# Patient Record
Sex: Female | Born: 1935
Health system: Southern US, Community
[De-identification: ages and names within clinical notes are randomized; demographics above are authoritative.]

## PROBLEM LIST (undated history)

## (undated) DIAGNOSIS — D649 Anemia, unspecified: Secondary | ICD-10-CM

## (undated) DIAGNOSIS — F419 Anxiety disorder, unspecified: Secondary | ICD-10-CM

## (undated) DIAGNOSIS — I5030 Unspecified diastolic (congestive) heart failure: Secondary | ICD-10-CM

## (undated) DIAGNOSIS — I10 Essential (primary) hypertension: Secondary | ICD-10-CM

## (undated) DIAGNOSIS — I4891 Unspecified atrial fibrillation: Secondary | ICD-10-CM

## (undated) HISTORY — PX: ABDOMINAL HYSTERECTOMY: SHX81

## (undated) HISTORY — PX: APPENDECTOMY: SHX54

---

## 1998-08-08 ENCOUNTER — Encounter: Payer: Self-pay | Admitting: Emergency Medicine

## 1998-08-08 ENCOUNTER — Encounter: Payer: Self-pay | Admitting: Internal Medicine

## 1998-08-08 ENCOUNTER — Inpatient Hospital Stay (HOSPITAL_COMMUNITY): Admission: EM | Admit: 1998-08-08 | Discharge: 1998-08-08 | Payer: Self-pay | Admitting: Emergency Medicine

## 2005-12-09 ENCOUNTER — Ambulatory Visit: Payer: Self-pay | Admitting: Internal Medicine

## 2006-06-18 ENCOUNTER — Ambulatory Visit: Payer: Self-pay | Admitting: Gastroenterology

## 2007-04-02 ENCOUNTER — Inpatient Hospital Stay: Payer: Self-pay | Admitting: Internal Medicine

## 2007-04-02 ENCOUNTER — Other Ambulatory Visit: Payer: Self-pay

## 2008-03-10 ENCOUNTER — Emergency Department: Payer: Self-pay | Admitting: Emergency Medicine

## 2008-03-22 ENCOUNTER — Ambulatory Visit: Payer: Self-pay | Admitting: Unknown Physician Specialty

## 2008-03-27 ENCOUNTER — Ambulatory Visit: Payer: Self-pay | Admitting: Unknown Physician Specialty

## 2008-05-22 ENCOUNTER — Inpatient Hospital Stay: Payer: Self-pay | Admitting: *Deleted

## 2008-05-24 ENCOUNTER — Ambulatory Visit: Payer: Self-pay | Admitting: Internal Medicine

## 2008-06-04 ENCOUNTER — Inpatient Hospital Stay: Payer: Self-pay | Admitting: Internal Medicine

## 2008-06-10 ENCOUNTER — Emergency Department: Payer: Self-pay | Admitting: Unknown Physician Specialty

## 2008-09-23 ENCOUNTER — Emergency Department: Payer: Self-pay | Admitting: Internal Medicine

## 2009-04-08 ENCOUNTER — Emergency Department: Payer: Self-pay | Admitting: Emergency Medicine

## 2009-11-16 IMAGING — CT CT CHEST W/ CM
1 of 2 series · 13 of 31 positions shown, 17 images · IV contrast (APPLIED)
Comparison: none

REASON FOR EXAM: dyspnea  low O2  sat
COMMENTS:

[Series 7: lung windows · axial · 0.67mm/px · z∈[-643,-400]mm · 13 of 97 slices shown, 17 images]
[im 8/97  mediastinal]
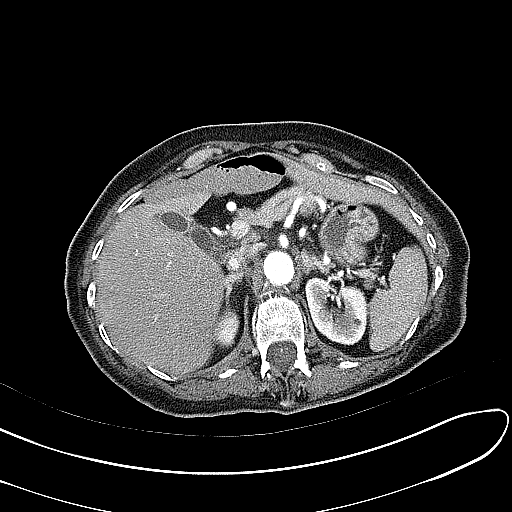
[im 8/97  lung]
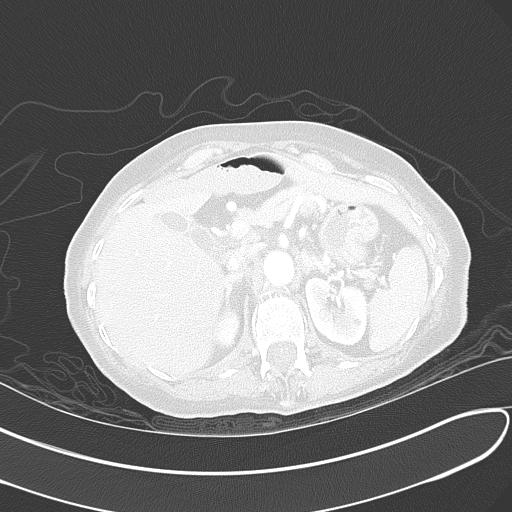
[im 15/97  lung]
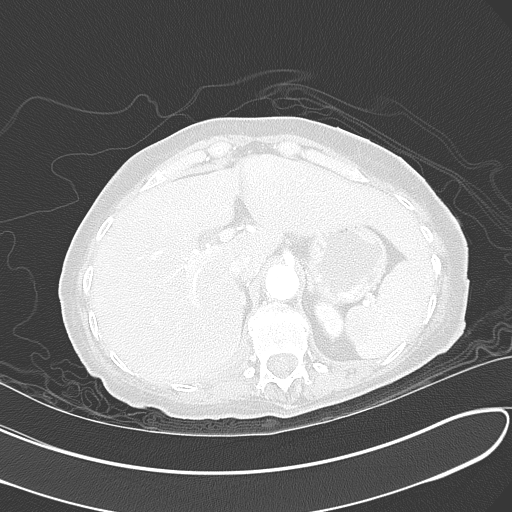
[im 23/97  lung]
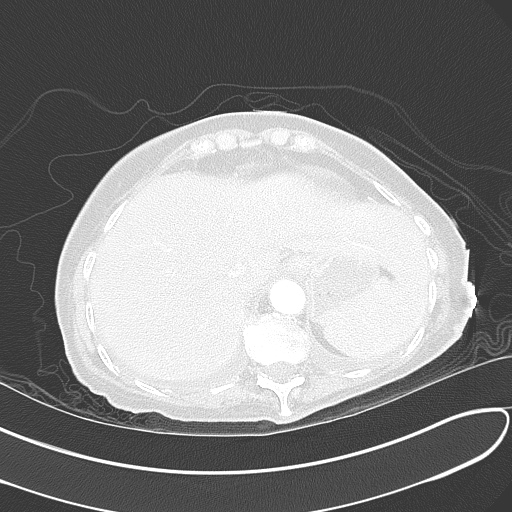
[im 30/97  lung]
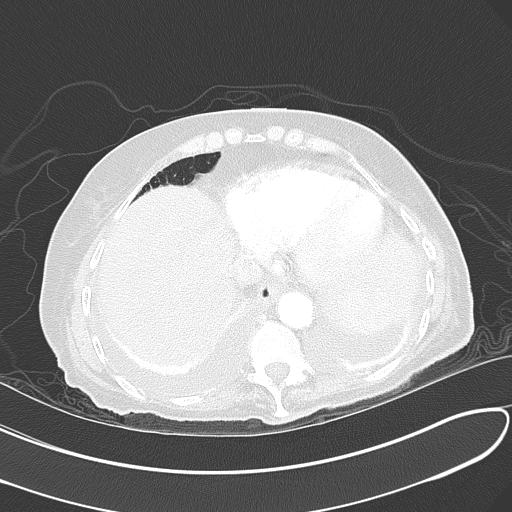
[im 37/97  mediastinal]
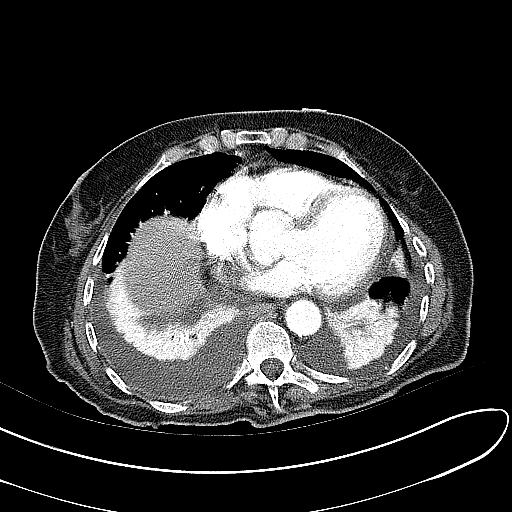
[im 37/97  lung]
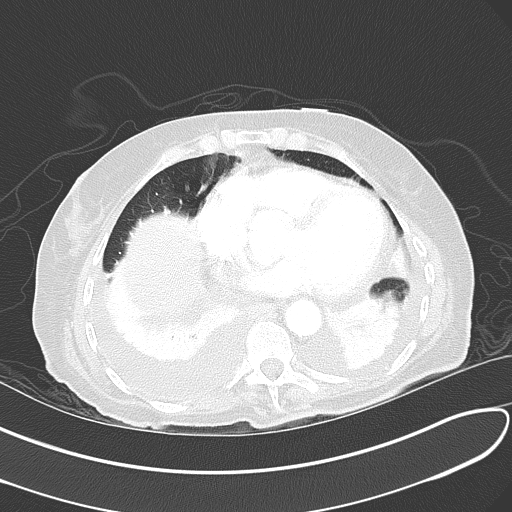
[im 45/97  lung]
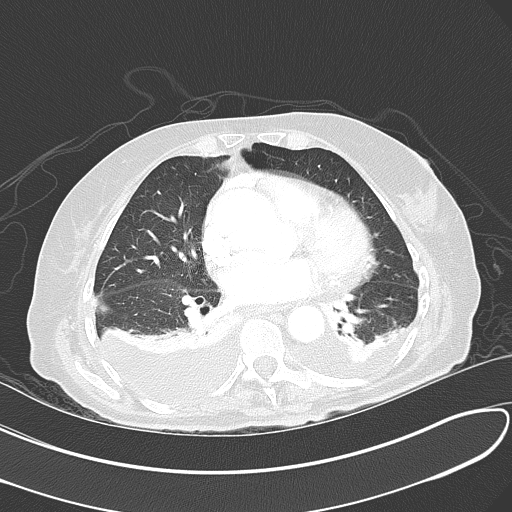
[im 49/97  lung]
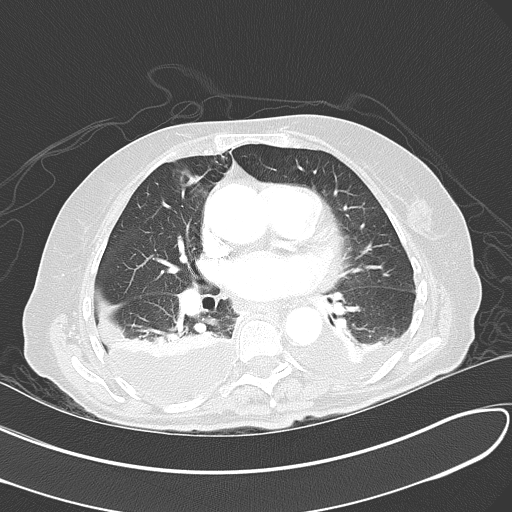
[im 52/97  lung]
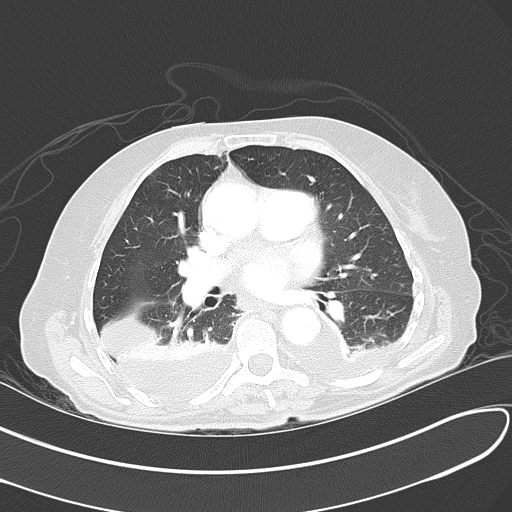
[im 60/97  mediastinal]
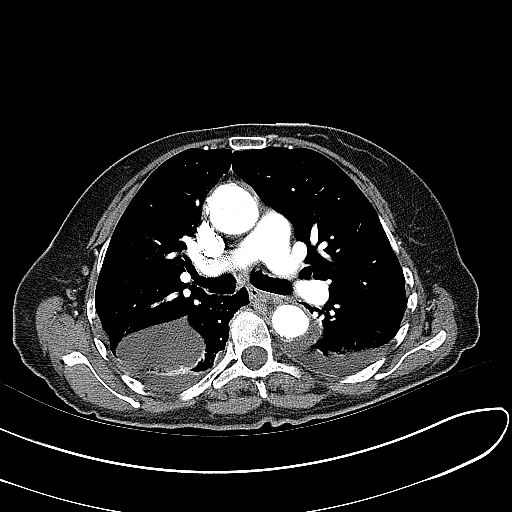
[im 60/97  lung]
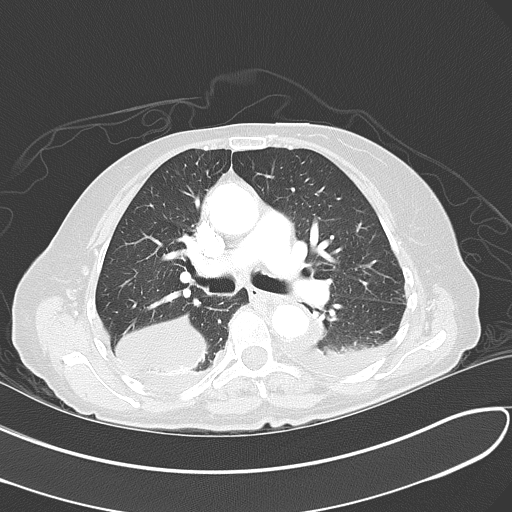
[im 67/97  lung]
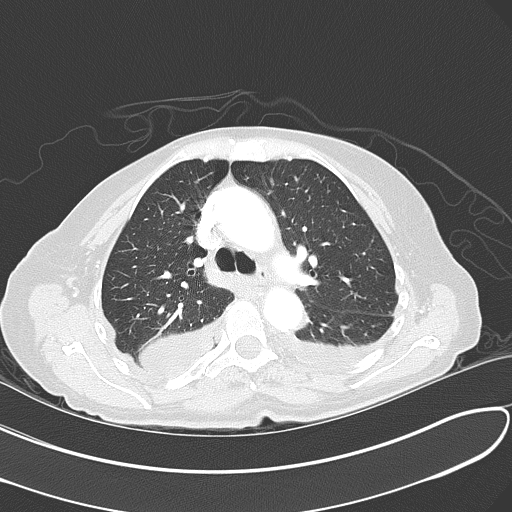
[im 74/97  lung]
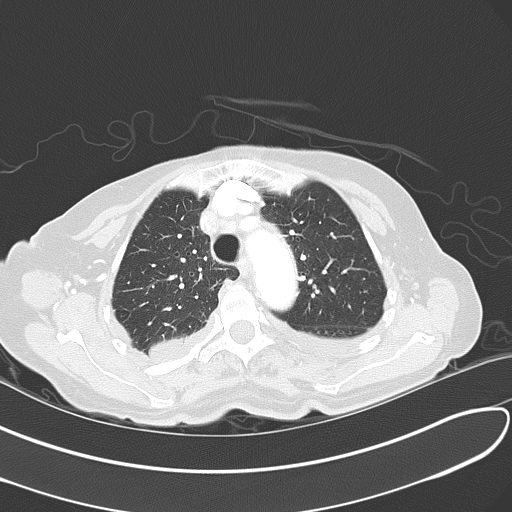
[im 82/97  lung]
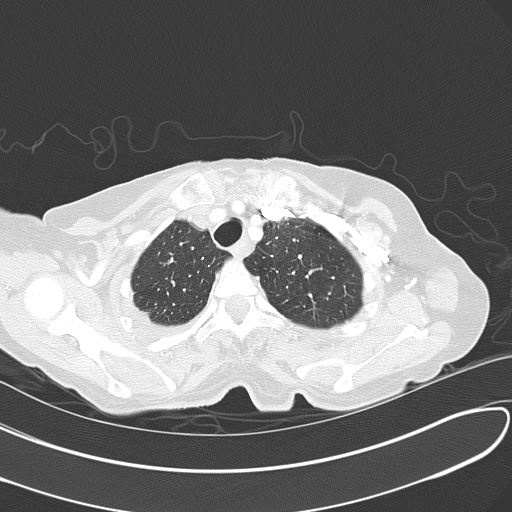
[im 89/97  mediastinal]
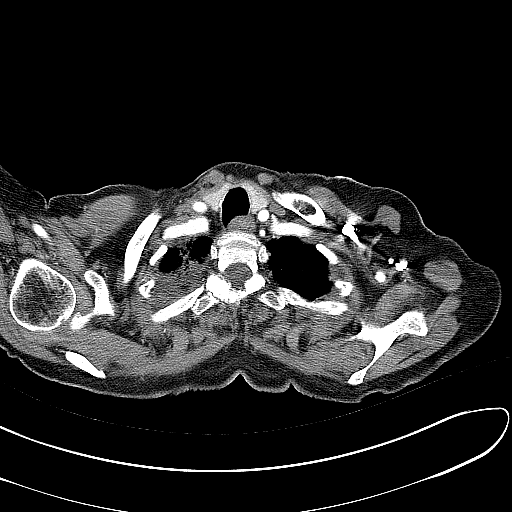
[im 89/97  lung]
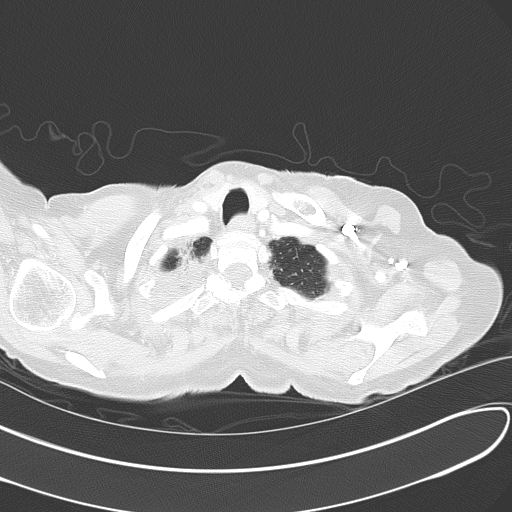

[13 of 31 positions shown; findings below may reference images not displayed]

PROCEDURE:     CT  - CT CHEST (FOR PE) W  - June 03, 2008 [DATE]

RESULT:     CT scanning was performed through the chest at 3 mm intervals
and slice thicknesses following intravenous administration of 100 cc of
Isovue 370. Review is made of the uppermost images of a CT scan of the
abdomen dated 22 May, 2008. The study was tailored to evaluate the
patient for possible acute pulmonary embolism. Review of 3-dimensional
reconstructed images was performed separately on the [employer] monitor.

There are are pleural effusions bilaterally. The effusion on the left is
smaller than that on the right. These are small to moderate in size. There
is bibasilar atelectasis. The cardiac chambers are enlarged. The caliber of
the thoracic aorta is normal. No false lumen is demonstrated. Contrast
within the pulmonary arterial tree is normal in appearance. I do not see
evidence of acute pulmonary embolism. No pathologic sized mediastinal or
hilar lymph nodes are demonstrated.

At lung window settings there is apical pleural scarring bilaterally. I do
not see alveolar infiltrates nor pulmonary parenchymal masses. Within the
upper abdomen the observed portions of the liver are normal. The adrenal
glands are not enlarged. The observed portions of the kidneys and spleen and
pancreas exhibit no acute abnormality. The gallbladder is partially imaged
and does not appear abnormal. Mild thickening of the limited amount of the
splenic flexure demonstrated is noted. The patient has had moderate
distention of bowel in this region in the past. Correlation clinically is
needed. Again, only a very small amount of the splenic flexure of the colon
is demonstrated

The thoracic vertebral bodies are preserved in height.
IMPRESSION: 1. There is no evidence of acute pulmonary embolism.
2. There is enlargement of the cardiac chambers with moderate sized
bilateral pleural effusions. A greater amount of fluid is on the right.
3. There is bibasilar atelectasis within both lungs.
4. There is mild thickening of the very small amount of the splenic flexure
the colon demonstrated. This is of questionable third significance.
Correlation clinically is needed.

A preliminary report was sent to the [HOSPITAL] the conclusion
of the study.

## 2009-11-16 IMAGING — CR DG CHEST 1V PORT
1 series · 1 of 1 positions shown · non-contrast
Comparison: none

REASON FOR EXAM: Shortness of Breath
COMMENTS:

[view not recorded]
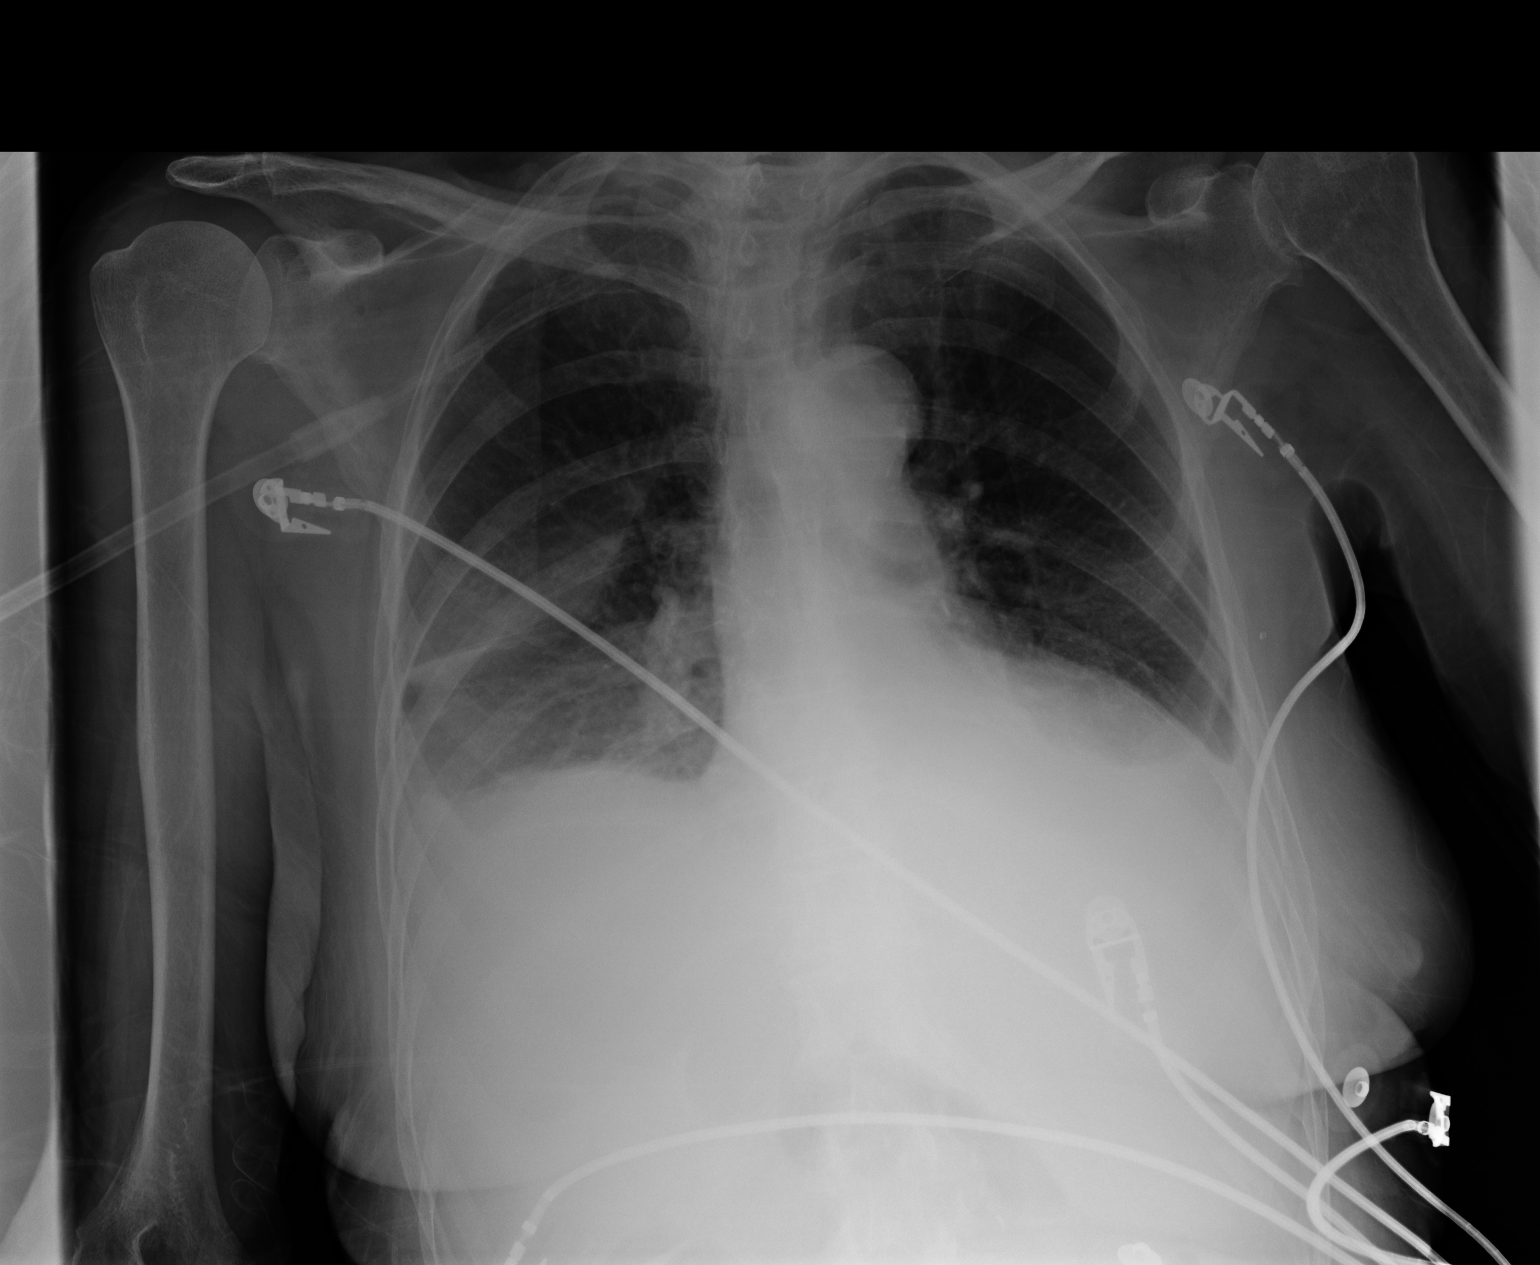

[1 of 1 positions shown; findings below may reference images not displayed]

PROCEDURE:     DXR - DXR PORTABLE CHEST SINGLE VIEW  - June 03, 2008  [DATE]

RESULT:     Comparison is made to a study dated 24 May, 2008.

The lungs are mildly hypoinflated. There are pleural effusions at both lung
bases not greatly changed from the prior study. The cardiac silhouette is
enlarged. The pulmonary vascularity is only mildly prominent.
IMPRESSION: There are bilateral pleural effusions. Left lower lobe
atelectasis is likely present. Overall there has not been dramatic change
since the prior study. A followup PA and lateral chest x-ray would be of
value.

## 2009-11-16 IMAGING — CT CT HEAD WITHOUT CONTRAST
2 series · 16 of 30 positions shown, 20 images · non-contrast
Comparison: none

REASON FOR EXAM: AMS
COMMENTS:

[Series 2: without · axial · non-contrast · 0.42mm/px · z∈[-11,+109]mm · 13 of 29 slices shown, 17 images]
[im 3/29  brain]
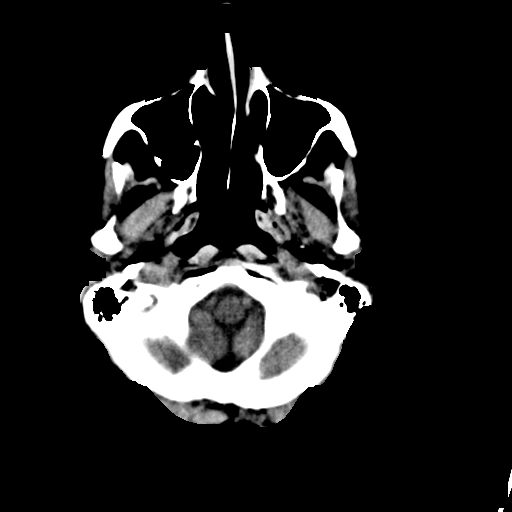
[im 3/29  bone]
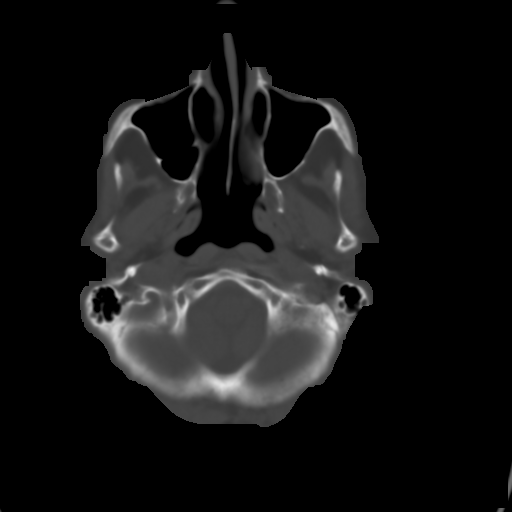
[im 5/29  brain]
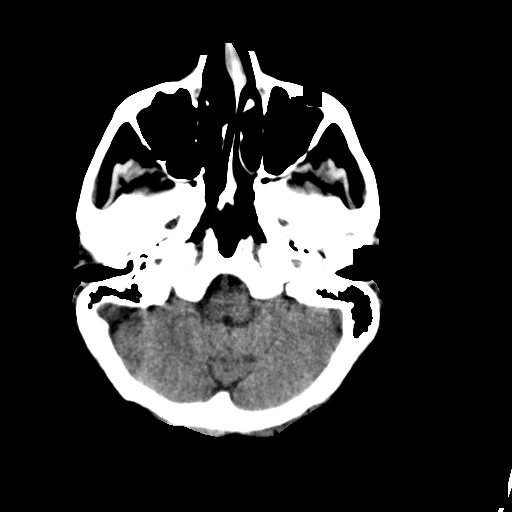
[im 7/29  brain]
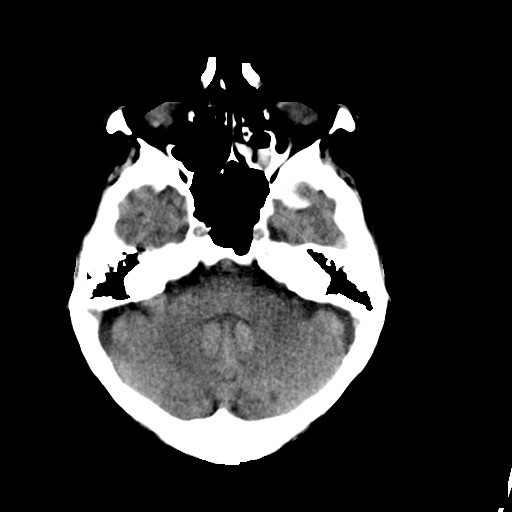
[im 9/29  brain]
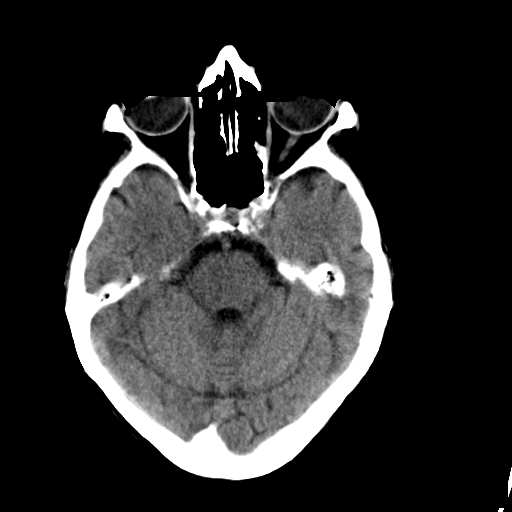
[im 11/29  brain]
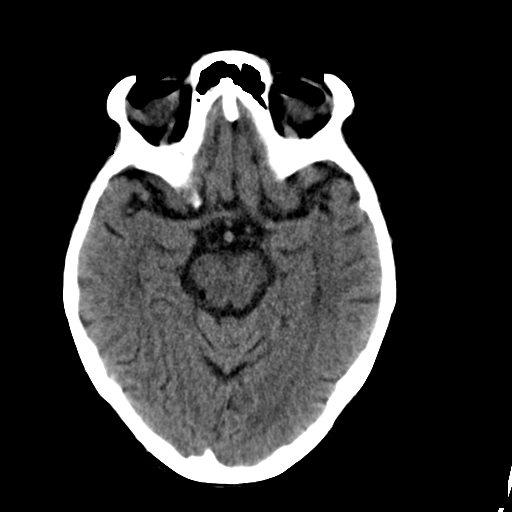
[im 11/29  bone]
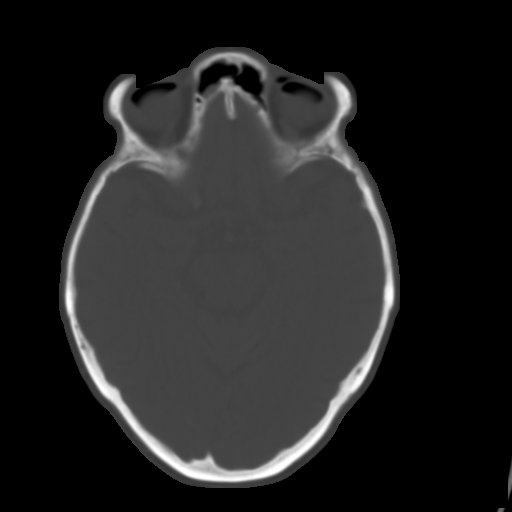
[im 13/29  brain]
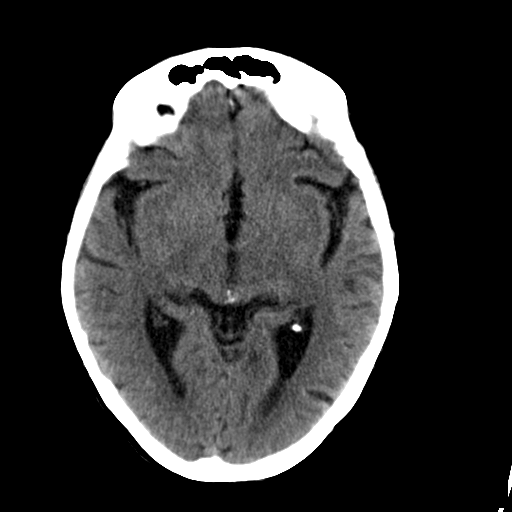
[im 15/29  brain]
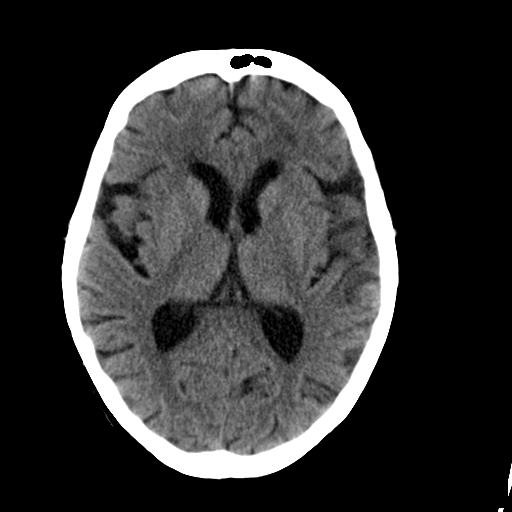
[im 17/29  brain]
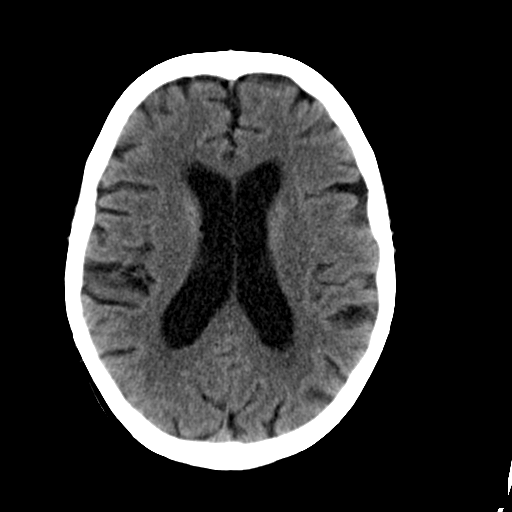
[im 19/29  brain]
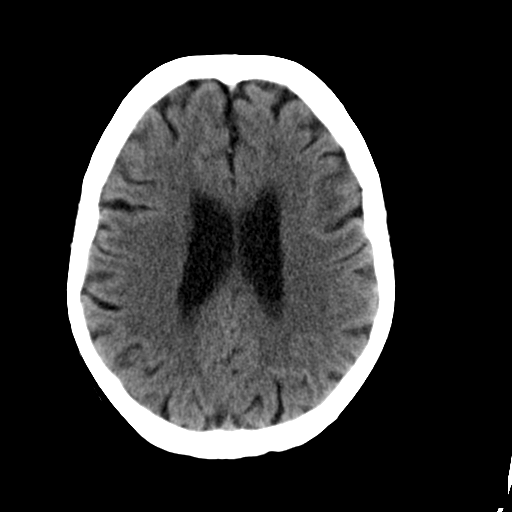
[im 19/29  bone]
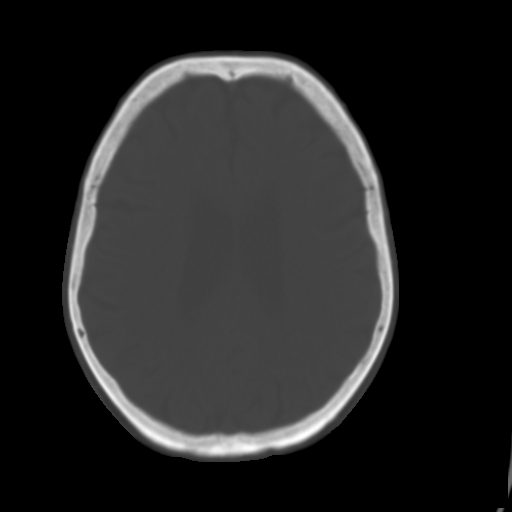
[im 21/29  brain]
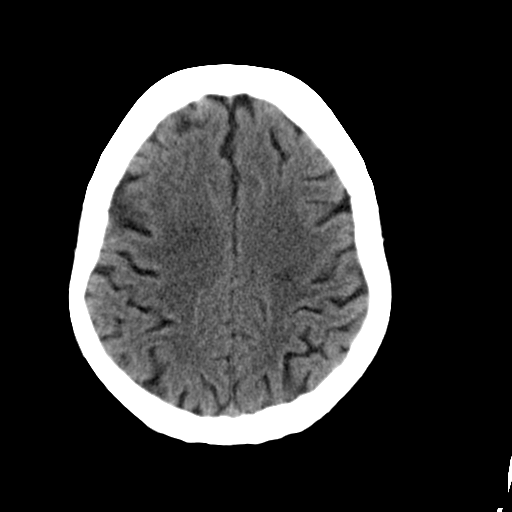
[im 23/29  brain]
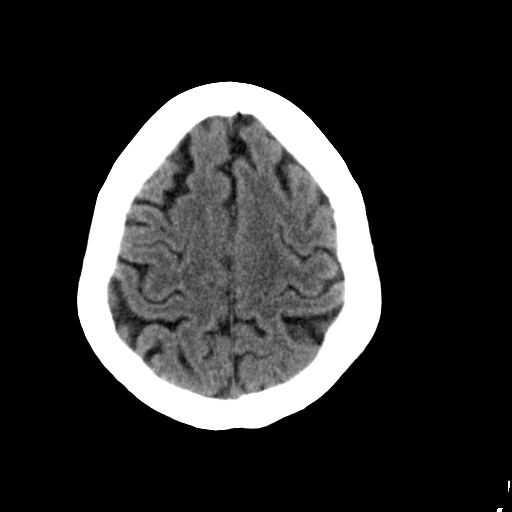
[im 25/29  brain]
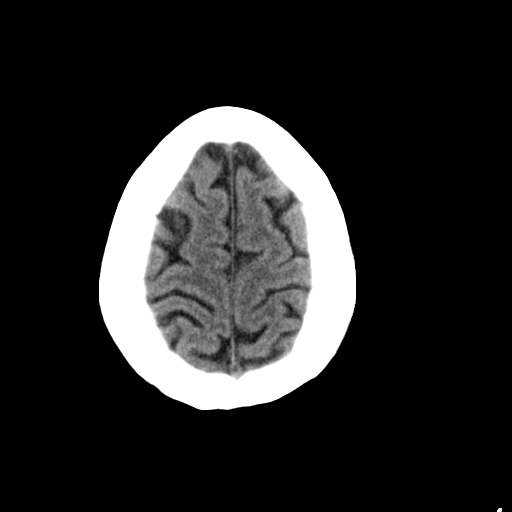
[im 27/29  brain]
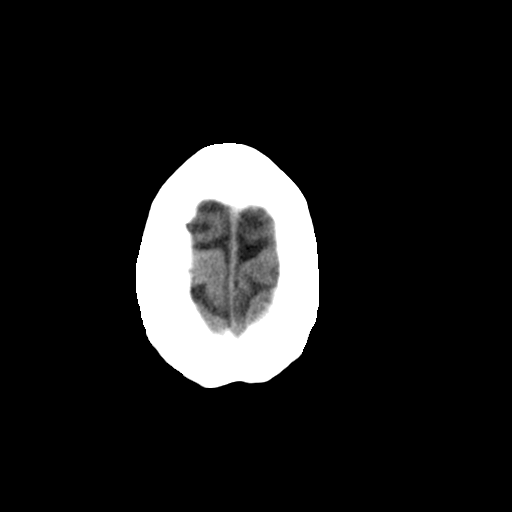
[im 27/29  bone]
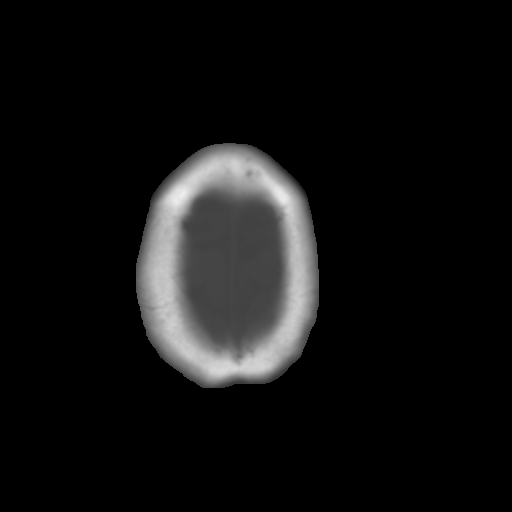

[Series 3: bone · axial · 0.42mm/px · z∈[-11,+29]mm · 3 of 29 slices shown]
[im 3/29  bone]
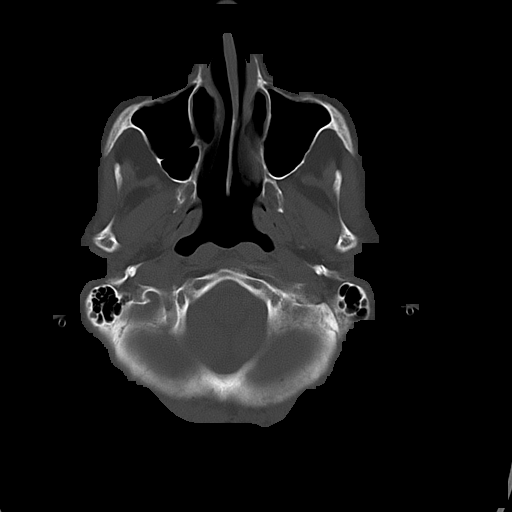
[im 7/29  bone]
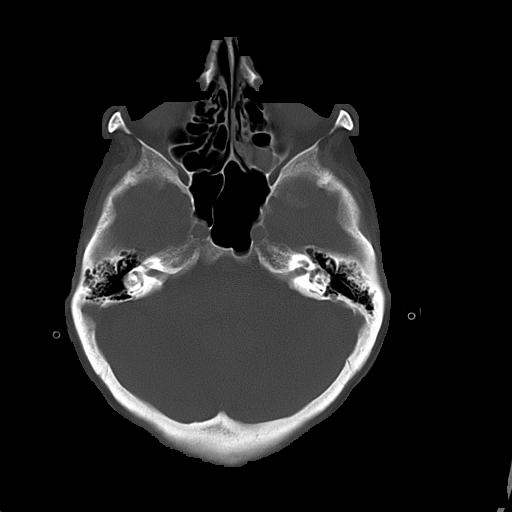
[im 11/29  bone]
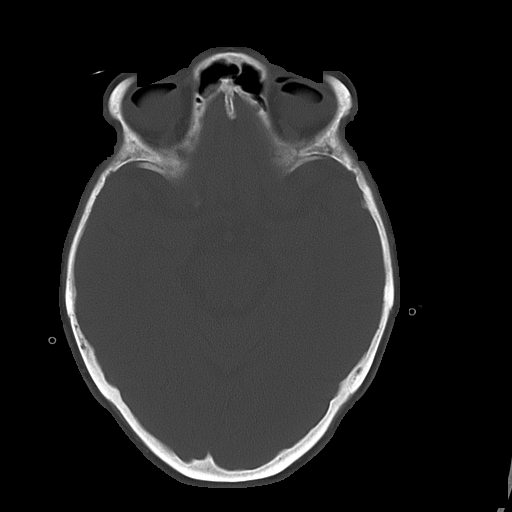

[16 of 30 positions shown; findings below may reference images not displayed]

PROCEDURE:     CT  - CT HEAD WITHOUT CONTRAST  - June 03, 2008 [DATE]

RESULT:     Axial noncontrast CT scanning was performed through the brain at
5 mm intervals and slice thicknesses.

The ventricles are normal in size and position. Very mild age-appropriate
atrophy is present. The cerebellum and brainstem are normal in appearance.
At bone window settings the observed portions of the paranasal sinuses
exhibit no air fluid levels. There is a small amount of soft tissue density
in posterior left ethmoid sinus cells. I do not see evidence of an acute
skull fracture.
IMPRESSION: There are mild age-appropriate atrophic changes present. I
do not see evidence of an acute ischemic or hemorrhagic event. No finding is
seen to suggest an intracranial mass.

## 2009-11-21 ENCOUNTER — Inpatient Hospital Stay: Payer: Self-pay | Admitting: Internal Medicine

## 2009-11-26 ENCOUNTER — Emergency Department: Payer: Self-pay | Admitting: Emergency Medicine

## 2011-04-21 ENCOUNTER — Ambulatory Visit: Payer: Self-pay | Admitting: Family Medicine

## 2011-08-12 ENCOUNTER — Inpatient Hospital Stay: Payer: Self-pay | Admitting: Internal Medicine

## 2011-08-12 LAB — CBC
HCT: 41 % (ref 35.0–47.0)
HGB: 13.4 g/dL (ref 12.0–16.0)
MCH: 29.8 pg (ref 26.0–34.0)
MCHC: 32.8 g/dL (ref 32.0–36.0)
MCV: 91 fL (ref 80–100)
Platelet: 190 10*3/uL (ref 150–440)
RBC: 4.51 10*6/uL (ref 3.80–5.20)
RDW: 14.5 % (ref 11.5–14.5)
WBC: 7.4 10*3/uL (ref 3.6–11.0)

## 2011-08-12 LAB — URINALYSIS, COMPLETE
Bacteria: NONE SEEN
Blood: NEGATIVE
Glucose,UR: NEGATIVE mg/dL (ref 0–75)
Hyaline Cast: 20
Leukocyte Esterase: NEGATIVE
Nitrite: NEGATIVE
Ph: 5 (ref 4.5–8.0)
Protein: 100
RBC,UR: 2 /HPF (ref 0–5)
Specific Gravity: 1.026 (ref 1.003–1.030)
Squamous Epithelial: NONE SEEN
WBC UR: 1 /HPF (ref 0–5)

## 2011-08-12 LAB — COMPREHENSIVE METABOLIC PANEL
Albumin: 3.5 g/dL (ref 3.4–5.0)
Alkaline Phosphatase: 125 U/L (ref 50–136)
Anion Gap: 12 (ref 7–16)
BUN: 19 mg/dL — ABNORMAL HIGH (ref 7–18)
Bilirubin,Total: 0.5 mg/dL (ref 0.2–1.0)
Calcium, Total: 8.7 mg/dL (ref 8.5–10.1)
Chloride: 106 mmol/L (ref 98–107)
Co2: 21 mmol/L (ref 21–32)
Creatinine: 1.02 mg/dL (ref 0.60–1.30)
EGFR (African American): 58 — ABNORMAL LOW
EGFR (Non-African Amer.): 50 — ABNORMAL LOW
Glucose: 121 mg/dL — ABNORMAL HIGH (ref 65–99)
Osmolality: 281 (ref 275–301)
Potassium: 3.1 mmol/L — ABNORMAL LOW (ref 3.5–5.1)
SGOT(AST): 39 U/L — ABNORMAL HIGH (ref 15–37)
SGPT (ALT): 25 U/L
Sodium: 139 mmol/L (ref 136–145)
Total Protein: 7.6 g/dL (ref 6.4–8.2)

## 2011-08-12 LAB — LIPASE, BLOOD: Lipase: 138 U/L (ref 73–393)

## 2011-08-12 LAB — CLOSTRIDIUM DIFFICILE BY PCR

## 2011-08-12 LAB — TROPONIN I: Troponin-I: <0.02 ng/mL

## 2011-08-13 LAB — CBC WITH DIFFERENTIAL/PLATELET
Eosinophil #: 0 10*3/uL (ref 0.0–0.7)
Eosinophil %: 0.7 %
Lymphocyte #: 1.5 10*3/uL (ref 1.0–3.6)
Lymphocyte %: 22.6 %
MCH: 29.9 pg (ref 26.0–34.0)
MCV: 91 fL (ref 80–100)
RBC: 3.39 10*6/uL — ABNORMAL LOW (ref 3.80–5.20)
RDW: 14.4 % (ref 11.5–14.5)
WBC: 6.7 10*3/uL (ref 3.6–11.0)

## 2011-08-13 LAB — BASIC METABOLIC PANEL
Creatinine: 0.9 mg/dL (ref 0.60–1.30)
EGFR (African American): >60
Glucose: 85 mg/dL (ref 65–99)
Osmolality: 278 (ref 275–301)
Sodium: 139 mmol/L (ref 136–145)

## 2011-08-14 LAB — CBC WITH DIFFERENTIAL/PLATELET
Basophil #: 0 10*3/uL (ref 0.0–0.1)
Eosinophil #: 0.1 10*3/uL (ref 0.0–0.7)
Eosinophil %: 2 %
HCT: 34.3 % — ABNORMAL LOW (ref 35.0–47.0)
HGB: 11.3 g/dL — ABNORMAL LOW (ref 12.0–16.0)
Lymphocyte #: 1.4 10*3/uL (ref 1.0–3.6)
Lymphocyte %: 25.9 %
MCH: 29.7 pg (ref 26.0–34.0)
MCHC: 32.9 g/dL (ref 32.0–36.0)
MCV: 90 fL (ref 80–100)
Monocyte #: 0.4 x10 3/mm (ref 0.2–0.9)
Platelet: 172 10*3/uL (ref 150–440)
RDW: 14.5 % (ref 11.5–14.5)
WBC: 5.4 10*3/uL (ref 3.6–11.0)

## 2011-08-14 LAB — BASIC METABOLIC PANEL
Anion Gap: 9 (ref 7–16)
BUN: 10 mg/dL (ref 7–18)
Chloride: 108 mmol/L — ABNORMAL HIGH (ref 98–107)
Co2: 22 mmol/L (ref 21–32)
Creatinine: 0.86 mg/dL (ref 0.60–1.30)
EGFR (African American): >60
EGFR (Non-African Amer.): >60
Glucose: 81 mg/dL (ref 65–99)
Potassium: 4.2 mmol/L (ref 3.5–5.1)

## 2011-08-15 LAB — BASIC METABOLIC PANEL
Anion Gap: 10 (ref 7–16)
BUN: 12 mg/dL (ref 7–18)
Calcium, Total: 8.5 mg/dL (ref 8.5–10.1)
Co2: 23 mmol/L (ref 21–32)
EGFR (African American): >60
Glucose: 88 mg/dL (ref 65–99)
Potassium: 3.8 mmol/L (ref 3.5–5.1)
Sodium: 140 mmol/L (ref 136–145)

## 2011-08-22 ENCOUNTER — Emergency Department: Payer: Self-pay | Admitting: *Deleted

## 2011-08-22 LAB — CBC
HCT: 33.5 % — ABNORMAL LOW (ref 35.0–47.0)
HGB: 11.1 g/dL — ABNORMAL LOW (ref 12.0–16.0)
MCHC: 33.2 g/dL (ref 32.0–36.0)
MCV: 91 fL (ref 80–100)
WBC: 4 10*3/uL (ref 3.6–11.0)

## 2011-08-22 LAB — URINALYSIS, COMPLETE
Bilirubin,UR: NEGATIVE
Blood: NEGATIVE
Glucose,UR: NEGATIVE mg/dL (ref 0–75)
Nitrite: NEGATIVE
Ph: 7 (ref 4.5–8.0)
Protein: NEGATIVE
RBC,UR: NONE SEEN /HPF (ref 0–5)
Specific Gravity: 1.004 (ref 1.003–1.030)
WBC UR: NONE SEEN /HPF (ref 0–5)

## 2011-08-22 LAB — COMPREHENSIVE METABOLIC PANEL
Albumin: 2.7 g/dL — ABNORMAL LOW (ref 3.4–5.0)
Alkaline Phosphatase: 73 U/L (ref 50–136)
Anion Gap: 9 (ref 7–16)
BUN: 11 mg/dL (ref 7–18)
Bilirubin,Total: 0.2 mg/dL (ref 0.2–1.0)
Chloride: 110 mmol/L — ABNORMAL HIGH (ref 98–107)
Creatinine: 0.67 mg/dL (ref 0.60–1.30)
EGFR (Non-African Amer.): >60
Osmolality: 284 (ref 275–301)
Potassium: 4 mmol/L (ref 3.5–5.1)
SGOT(AST): 17 U/L (ref 15–37)
Total Protein: 6.4 g/dL (ref 6.4–8.2)

## 2011-08-22 LAB — LIPASE, BLOOD: Lipase: 165 U/L (ref 73–393)

## 2012-04-26 ENCOUNTER — Emergency Department: Payer: Self-pay | Admitting: Emergency Medicine

## 2012-04-26 LAB — CBC
HCT: 40.1 % (ref 35.0–47.0)
MCV: 89 fL (ref 80–100)
RBC: 4.48 10*6/uL (ref 3.80–5.20)
WBC: 5.9 10*3/uL (ref 3.6–11.0)

## 2012-04-26 LAB — COMPREHENSIVE METABOLIC PANEL
Albumin: 4 g/dL (ref 3.4–5.0)
Alkaline Phosphatase: 127 U/L (ref 50–136)
Anion Gap: 9 (ref 7–16)
BUN: 15 mg/dL (ref 7–18)
Bilirubin,Total: 0.2 mg/dL (ref 0.2–1.0)
Chloride: 104 mmol/L (ref 98–107)
Creatinine: 0.82 mg/dL (ref 0.60–1.30)
EGFR (Non-African Amer.): >60
Glucose: 97 mg/dL (ref 65–99)
Potassium: 3.6 mmol/L (ref 3.5–5.1)
SGPT (ALT): 15 U/L (ref 12–78)
Total Protein: 8.3 g/dL — ABNORMAL HIGH (ref 6.4–8.2)

## 2012-04-26 LAB — CK TOTAL AND CKMB (NOT AT ARMC)
CK, Total: 44 U/L (ref 21–215)
CK-MB: 0.5 ng/mL (ref 0.5–3.6)

## 2012-04-26 LAB — TROPONIN I: Troponin-I: <0.02 ng/mL

## 2013-01-10 ENCOUNTER — Ambulatory Visit: Payer: Self-pay | Admitting: Unknown Physician Specialty

## 2013-12-26 ENCOUNTER — Ambulatory Visit: Payer: Self-pay | Admitting: Ophthalmology

## 2014-04-17 DIAGNOSIS — E782 Mixed hyperlipidemia: Secondary | ICD-10-CM | POA: Diagnosis not present

## 2014-04-17 DIAGNOSIS — I2510 Atherosclerotic heart disease of native coronary artery without angina pectoris: Secondary | ICD-10-CM | POA: Diagnosis not present

## 2014-04-17 DIAGNOSIS — I10 Essential (primary) hypertension: Secondary | ICD-10-CM | POA: Diagnosis not present

## 2014-04-17 DIAGNOSIS — I480 Paroxysmal atrial fibrillation: Secondary | ICD-10-CM | POA: Diagnosis not present

## 2014-07-23 NOTE — Consult Note (Signed)
Chief Complaint:   Subjective/Chief Complaint feeling better today, one loose stool early this am.  less abdominal pain, no n/v.   VITAL SIGNS/ANCILLARY NOTES: **Vital Signs.:   15-May-13 10:09   Vital Signs Type Pre Medication   Temperature Temperature (F) 98.2   Celsius 36.7   Temperature Source oral   Pulse Pulse 71   Pulse source per Dinamap   Respirations Respirations 18   Systolic BP Systolic BP 116   Diastolic BP (mmHg) Diastolic BP (mmHg) 64   Mean BP 80   BP Source Dinamap   Pulse Ox % Pulse Ox % 92   Pulse Ox Activity Level  At rest   Oxygen Delivery Room Air/ 21 %   Brief Assessment:   Cardiac Regular    Respiratory clear BS    Gastrointestinal details normal Soft  Nondistended  No masses palpable  Bowel sounds normal  No rebound tenderness  mild to moderate lower abdominal pain to palpation   Routine Hem:  15-May-13 02:49    WBC (CBC) 6.7   RBC (CBC) 3.39   Hemoglobin (CBC) 10.1   Hematocrit (CBC) 30.7   Platelet Count (CBC) 157   MCV 91   MCH 29.9   MCHC 33.0   RDW 14.4  Routine Chem:  15-May-13 02:49    Glucose, Serum 85   BUN 15   Creatinine (comp) 0.90   Sodium, Serum 139   Potassium, Serum 4.5   Chloride, Serum 112   CO2, Serum 21   Calcium (Total), Serum 7.7   Osmolality (calc) 278   eGFR (African American) >60   eGFR (Non-African American) 58   Anion Gap 6  Routine Hem:  15-May-13 02:49    Neutrophil % 68.7   Lymphocyte % 22.6   Monocyte % 7.8   Eosinophil % 0.7   Basophil % 0.2   Neutrophil # 4.6   Lymphocyte # 1.5   Monocyte # 0.5   Eosinophil # 0.0   Basophil # 0.0  Routine Chem:  15-May-13 02:49    Magnesium, Serum 2.1   Assessment/Plan:  Assessment/Plan:   Assessment 1) acute diarrheal illness-improving, likely gastroenteritis.    Plan 1) continue current, advance diet to full liquids tomorrow. stool culture results pending.   Electronic Signatures: Loistine Simas (MD)  (Signed [phone] 12:42)  Authored: Chief  Complaint, VITAL SIGNS/ANCILLARY NOTES, Brief Assessment, Lab Results, Assessment/Plan   Last Updated: 15-May-13 12:42 by Loistine Simas (MD)

## 2014-07-23 NOTE — H&P (Signed)
PATIENT NAME:  Colleen Nguyen, KOEHL MR#:  [id number] DATE OF BIRTH:  09/17/1922  DATE OF ADMISSION:  08/12/2011  PRIMARY CARE PHYSICIAN: Dr. Ellison Hughs.   REFERRING PHYSICIAN: Dr. Renee Ramus.   CHIEF COMPLAINT: Diarrhea, abdominal pain.   HISTORY OF PRESENT ILLNESS: This is an 81 year old female who lives at home who presents with complaints of diarrhea and abdominal pain. The patient reports diarrhea started yesterday when she had multiple episodes of bowel movements which she cannot even count, watery brown in color. The patient reports she has been having abdominal pain with that and nausea and dry heaves, but no actual vomiting. The patient denies any coffee-ground emesis, any hematochezia, any blood with the stools. The patient had a CT of abdomen done in the Emergency Department without IV contrast, which did show bowel wall thickening and pericolic inflammatory changes involving the cecum, ascending colon, descending colon and sigmoid colon, which is most concerning for colitis, which may be secondary to an infectious or inflammatory etiology. The patient reports she has been taking doxycycline for two weeks for right eye infection. Denies any previous history of C. difficile, but reports she was admitted a couple of years ago for acute diverticulitis where it resolved with IV antibiotics and reports no recurrence of any abdominal complaints since then other than occasional diarrhea. The patient was given IV Levaquin and Flagyl in the ED. The patient denies any chest pain, shortness of breath, near syncope, syncope, or lightheadedness.   PAST MEDICAL HISTORY:  1. History of previously known diverticulitis.  2. Known coronary disease.  3. Paroxysmal atrial fibrillation, currently normal sinus rhythm, not on chronic anticoagulation.  4. Chronic back pain.  5. Anemia of chronic disease.  6. Hyperlipidemia.  7. Anxiety disorder.  8. Gastroesophageal reflux disease.   PAST SURGICAL HISTORY: Schatzki  ring, which was dilated.   HOME MEDICATIONS:  1. Aspirin 81 mg daily.  2. Xanax 0.5 mg p.o. b.i.d. p.r.n.  3. Cardizem CD 180 mg daily.  4. Metoprolol p.r.n. once after heart rate is out of control.   ALLERGIES: Phenergan.  FAMILY HISTORY: No history of heart disease.   SOCIAL HISTORY: No history of smoking, alcohol abuse. She lives with her daughter.   REVIEW OF SYSTEMS: CONSTITUTIONAL: Denies any fever. Complains of fatigue and weakness. EYES: Denies any blurry vision or double vision. Has recent eye infection. ENT: Denies any tinnitus, ear pain, hearing loss. RESPIRATORY: Denies any cough, wheezing, hemoptysis. CARDIOVASCULAR: Denies any chest pain, orthopnea, edema. GASTROINTESTINAL: Denies any hematemesis, coffee-ground emesis. Complains of nausea, dry heaves and diarrhea, multiple episodes, watery, brown in color. GENITOURINARY: Denies any dysuria, hematuria, renal colic. ENDOCRINE: Denies any polyuria, polydipsia, heat or cold intolerance. HEMATOLOGY: Denies any easy bruising, bleeding, diathesis, or history of blood clots. NEUROLOGIC: Denies any numbness, dysarthria, epilepsy, tremors. PSYCH: Denies any insomnia, alcohol or drug abuse.   PHYSICAL EXAMINATION:  VITAL SIGNS: Temperature 95.9, pulse 75, respiratory rate 18, blood pressure 132/67, saturating 95% on room air.   GENERAL: Elderly, frail female who is comfortable in bed in no apparent distress.   HEENT: Head atraumatic, normocephalic. Pupils equal, reactive to light. Pink conjunctiva. Anicteric sclera. Dry oral mucosa.   NECK: Supple. No thyromegaly. No JVD.   CHEST: Good air entry bilaterally. No wheezing, rales, or rhonchi.   CARDIOVASCULAR: S1, S2 heard. No rubs, murmur, or gallops.   ABDOMEN: Mild tenderness. No rebound. No guarding. Bowel sounds hyperperistaltic.   EXTREMITIES: No edema. No clubbing. No cyanosis.   PSYCHIATRIC: Appropriate affect. Awake,  alert x3. Intact judgment and insight.   NEUROLOGIC:  Cranial nerves grossly intact. Motor nonfocal.   PERTINENT LABS: Glucose 121, BUN 19, creatinine 1.02, sodium 139, potassium 3.1, chloride 106, CO2 21, total protein 7.6, alkaline phosphatase 125, AST 39, ALT 25. Troponin less than 0.02. White blood cells 7.4, hemoglobin 13.4, hematocrit 41, platelets 190.  Urinalysis negative. EKG: The patient actually is in paroxysmal atrial fibrillation with RVR. At one point she is rate controlled. One EKG showing heart rate of 77, normal sinus rhythm. Another EKG done a little bit later showing atrial fibrillation with RVR at a rate of 116.   ASSESSMENT AND PLAN:  1. Colitis. The patient has evidence of colitis on the CT, has diarrhea. Will send stool work-up. Will send stool cultures, white blood cells and C. difficile. The patient already received IV Levaquin and Flagyl in the ED. We will keep the patient on IV Flagyl until the rest of the stool work-up is done and C. difficile is ruled out. Once ruled, the patient can be resumed on IV Cipro, IV Levaquin. Discussed with gastroenterology, Dr. Gustavo Lah, who will see the patient and we will keep her on clear liquid diet and IV fluid hydration.  2. History of atrial fibrillation. The patient is having paroxysmal atrial fibrillation with RVR. Will continue her on Cardizem CD 180 mg and will have her on p.r.n. metoprolol for episodes of atrial fibrillation with RVR.  3. Hypokalemia. We will replace.  4. Deep vein thrombosis prophylaxis. Subcutaneous heparin 5,000 units every eight hours.  5. Gastrointestinal prophylaxis with Protonix.  6. CODE STATUS: The patient is FULL CODE.   TOTAL TIME SPENT FOR PATIENT CARE: 50 minutes.   ____________________________ Albertine Patricia, MD dse:ap D: 08/12/2011 09:00:20 ET                  T: 08/12/2011 09:46:34 ET JOB#: [id number] cc: Albertine Patricia, MD, <Dictator> Sofie Hartigan, MD Dimetrius Montfort Graciela Husbands MD ELECTRONICALLY SIGNED 08/16/2011 0:18

## 2014-07-23 NOTE — Consult Note (Signed)
Chief Complaint:   Subjective/Chief Complaint feeling some better, less nausea, one bm this am, loose.  much less abdominal pain.   VITAL SIGNS/ANCILLARY NOTES: **Vital Signs.:   17-May-13 13:25   Vital Signs Type Routine   Temperature Temperature (F) 97.1   Celsius 36.1   Temperature Source axillary   Pulse Pulse 76   Pulse source per Dinamap   Respirations Respirations 20   Systolic BP Systolic BP 424   Diastolic BP (mmHg) Diastolic BP (mmHg) 66   Mean BP 81   BP Source Dinamap   Pulse Ox % Pulse Ox % 92   Pulse Ox Activity Level  At rest   Oxygen Delivery Room Air/ 21 %  *Intake and Output.:   17-May-13 08:02   Stool  mod loose   Brief Assessment:   Cardiac Irregular    Respiratory clear BS    Gastrointestinal details normal Soft  Nondistended  No masses palpable  Bowel sounds normal  No rebound tenderness  mild rlq discomfort to palpation   Routine Chem:  17-May-13 03:04    Glucose, Serum 88   BUN 12   Creatinine (comp) 0.85   Sodium, Serum 140   Potassium, Serum 3.8   Chloride, Serum 107   CO2, Serum 23   Calcium (Total), Serum 8.5   Osmolality (calc) 279   eGFR (African American) >60   eGFR (Non-African American) >60   Anion Gap 10   Assessment/Plan:  Assessment/Plan:   Assessment 1) diarrheal illness-improving, much less abdominal pain and nausea. tolerating regular po.  2) af/rvr this am    Plan 1) finish 7-10 day course of abx as you are.  start align probiotic.  Will need GI outpatient fu in a couple weeks, sooner if needed. Will ask Dr Dionne Milo to see tomorrow.   Electronic Signatures: Loistine Simas (MD)  (Signed 17-May-13 15:48)  Authored: Chief Complaint, VITAL SIGNS/ANCILLARY NOTES, Brief Assessment, Lab Results, Assessment/Plan   Last Updated: 17-May-13 15:48 by Loistine Simas (MD)

## 2014-07-23 NOTE — Consult Note (Signed)
PATIENT NAME:  Colleen Nguyen, Colleen Nguyen MR#:  [id number] DATE OF BIRTH:  [birth year]  DATE OF CONSULTATION:  08/12/2011  REFERRING PHYSICIAN:  Phillips Climes, MD CONSULTING PHYSICIAN:  Lollie Sails, MD  REASON FOR CONSULTATION: Colitis.   HISTORY OF PRESENT ILLNESS: Ms. Hunton is an 81 year old Caucasian female who was in her usual state of health until this past Monday, that is yesterday, at about 4:00 p.m. At that time she began to have some nausea accompanied by some emesis as well as diarrhea. She denies any blood in the stools. She states that about a month ago she was placed on some doxycycline for an anticipated eye surgery. She stopped it for a bit and then restarted it after a bit having finished that course yesterday. She does have a history of diverticulitis in the past. This episode of nausea, vomiting, and diarrhea was accompanied with lower abdominal pain that was more yesterday, somewhat better today. Her last bowel movement today was at about 10 o'clock  a.m. She has not thrown up since this morning either. There has been no black stools, blood in the stools, or slimy stools, per patient. She has no previous history of peptic ulcer disease. The patient has a history of an EGD done twice for dysphagia with dilatation of a Schatzki ring. She also has finding of a hiatal hernia. She had her last colonoscopy 06/18/2006 for screening purposes this being related as being normal. She has had stool studies obtained on this hospitalization which are pending. A C. difficile was negative. She did have a CT scan of the abdomen and pelvis. This was read as having some bowel wall thickening and pericolonic inflammatory changes involving the entire colon concerning for colitis secondary to inflammatory versus infectious etiology. No evidence of pneumoperitoneum, etc. There was also a  small hiatal hernia with some GE reflux.   PAST MEDICAL HISTORY:  1. Esophageal dilatation x2, as  noted. 2. Hypertension. 3. Hysterectomy. 4. Appendectomy.  5. Heart catheterization in 2009 showing coronary artery disease but did not require stent or further intervention, other than medication management.  6. Known history of diverticulitis. 7. History of paroxysmal atrial fibrillation. Currently on a monitor. She had some mild tachycardia I believe earlier. 8. Chronic back pain. 9. Anemia of chronic disease. 10. Anxiety disorder. 11. Gastroesophageal reflux. 12. Hyperlipidemia.   OUTPATIENT MEDICATIONS:  1. Aspirin 81 mg daily. 2. Cardizem CD 180 mg once a day. 3. Motrin over-the-counter. 4. Omeprazole 20 mg once a day. 5. Xanax 0.5 mg twice a day.  6. Zofran 4 mg every six hours p.r.n.   CURRENT MEDICATIONS: Currently the patient is on metronidazole.   ALLERGIES: She is allergic to Phenergan.   LABS: Stool studies are pending.   PHYSICAL EXAMINATION:   VITAL SIGNS: Temperature 98.3, pulse 69, respirations 18, blood pressure 107/64, and pulse oximetry 96%.   GENERAL: She is an 81 year old Caucasian female in no acute distress.   HEAD: Normocephalic, atraumatic.   EYES: Anicteric.   NOSE: Septum midline. No lesions.   OROPHARYNX: No lesions.   NECK: Supple. No JVD.   HEART: Regular rate and rhythm.   LUNGS: Clear.   ABDOMEN: Soft. She has generalized abdominal discomfort. Bowel sounds are positive, normoactive. There is no apparent organomegaly or mass felt.   RECTAL: Anorectal exam is deferred.   EXTREMITIES: No clubbing, cyanosis, or edema.   NEUROLOGICAL: Cranial nerves II through XII are grossly intact. Muscle strength bilaterally equal and symmetric. Deep tendon reflexes bilaterally equal  and symmetric.   LABS/STUDIES: On admission to the hospital she had a glucose 121, BUN 19, creatinine 1.02, sodium 139, potassium 3.1, chloride 106, bicarbonate 21, and lipase 138. Hepatic profile was normal with the exception of the AST being slightly elevated at 29.  She has had one cardiac enzyme done this showing a normal troponin I. Her hemogram was normal with a white count of 7.4, platelet count 190, and hemoglobin 13.4.   C. difficile is negative. Further stool studies are pending.   Urinalysis is negative.   She has an EKG showing atrial fibrillation.   She had a CT scan as noted above with bowel wall thickening and pericolonic inflammatory changes involving cecum, ascending descending, and sigmoid colon.   ASSESSMENT: Acute onset diarrheal illness with nausea, vomiting, and diarrhea. The patient denies any blood in the stool. This does not sound to be a chronic recurrent problem. Most likely this is an infectious diarrhea/colitis. It is of note, the patient's daughter who was in the room when I was interviewing the patient, stated that she had an episode of nausea with diarrhea for about three days a couple of weeks ago.   RECOMMENDATIONS: Await stool studies. Continue antibiotics as you are. It may be necessary to broaden antibiotic coverage by including Cipro. C. difficile is negative currently.      She has had a colonoscopy within the past 5 to 6 years that being normal and there is no family history of inflammatory bowel disease or colon cancer. Would await finals on stool studies. We will follow with you. ____________________________ Lollie Sails, MD mus:slb D: 08/12/2011 13:57:19 ET T: 08/12/2011 14:18:40 ET JOB#: [id number]  cc: Lollie Sails, MD, <Dictator> Lollie Sails MD ELECTRONICALLY SIGNED 08/29/2011 17:27

## 2014-07-23 NOTE — Consult Note (Signed)
Chief Complaint:   Subjective/Chief Complaint Admitted with acute diarrheal illness and abdominal pain, CT with colitis. Has been on fluids and Cipro/Flagly/Pantoprazole. Stool negative for c-diff, culture/wbc pending. States no further stools since yesterday.  Abdomen less tender, and is feeling better. Tolerating po at present. Wants to get out of bed   VITAL SIGNS/ANCILLARY NOTES: **Vital Signs.:   16-May-13 04:01   Vital Signs Type Q 4hr   Temperature Temperature (F) 98.3   Celsius 36.8   Temperature Source oral   Pulse Pulse 81   Pulse source per Dinamap   Respirations Respirations 20   Systolic BP Systolic BP 621   Diastolic BP (mmHg) Diastolic BP (mmHg) 76   Mean BP 104   BP Source Dinamap   Pulse Ox % Pulse Ox % 90   Pulse Ox Activity Level  At rest   Oxygen Delivery Room Air/ 21 %  *Intake and Output.:   Daily 16-May-13 07:00   Grand Totals Intake:  2480 Output:  800    Net:  1680 24 Hr.:  1680   Oral Intake      In:  480   IV (Primary)      In:  1800   IV (Secondary)      In:  200   Urine ml     Out:  800   Length of Stay Totals Intake:  2480 Output:  1525    Net:  955   Brief Assessment:   Cardiac Regular  no murmur  -- thrills  -- LE edema  -- JVD  --Rub  --Gallop    Respiratory normal resp effort  clear BS  no use of accessory muscles    Gastrointestinal details normal Soft  Nondistended  No masses palpable  Bowel sounds normal  No rebound tenderness  No gaurding  No rigidity  No organomegaly  mild bilateral lower tenderness    Additional Physical Exam AAOX3, CNs II-XII intact. Speech clear, no facial droop. Skin w/d/pink without erythema, lesion, or rash.     Routine Chem:  16-May-13 06:13    Glucose, Serum 81   BUN 10   Creatinine (comp) 0.86   Sodium, Serum 139   Potassium, Serum 4.2   Chloride, Serum 108   CO2, Serum 22   Calcium (Total), Serum 8.1   Anion Gap 9   Osmolality (calc) 276   eGFR (African American) >60   eGFR (Non-African  American) >60  Routine Hem:  16-May-13 06:13    WBC (CBC) 5.4   RBC (CBC) 3.80   Hemoglobin (CBC) 11.3   Hematocrit (CBC) 34.3   Platelet Count (CBC) 172   MCV 90   MCH 29.7   MCHC 32.9   RDW 14.5   Neutrophil % 63.5   Lymphocyte % 25.9   Monocyte % 8.3   Eosinophil % 2.0   Basophil % 0.3   Neutrophil # 3.4   Lymphocyte # 1.4   Monocyte # 0.4   Eosinophil # 0.1   Basophil # 0.0   Radiology Results: CT:    14-May-13 06:21, CT Abdomen and Pelvis Without Contrast   CT Abdomen and Pelvis Without Contrast    REASON FOR EXAM:    (1) pain, NVD; (2) pain, NVD;    NOTE: Nursing to   Give Oral CT Contrast  COMMENTS:       PROCEDURE: CT  - CT ABDOMEN AND PELVIS W0  - Aug 12 2011  6:21AM     RESULT: Indication: Nausea  vomiting    Comparison: 11/21/2009    Technique: Multiple axial images from the lung bases to the symphysis   pubis were obtained with oral and without intravenous contrast.    Findings:    The lung bases are clear. There is no pleural or pericardial effusions.    No renal, ureteral, or bladdercalculi. No obstructive uropathy. No   perinephric stranding is seen. The kidneys are symmetric in size without   evidence for exophytic mass. The bladder is unremarkable.    The liver demonstrates no focal abnormality. The gallbladder is   unremarkable. The spleen demonstrates no focal abnormality. The adrenal   glands and pancreas are normal.     There is a small hiatal hernia with gastroesophageal reflux. There is a   small amount of a abdominal or pelvic free fluid. There is bowel wall   thickening and pericolonic inflammatory change involving the cecum,   ascending colon, descending colon and sigmoid colon most concerning for   colitis which may secondary to an infectious or inflammatory etiology.      There is no pneumoperitoneum, pneumatosis, or portal venous gas. There is   no lymphadenopathy.     The abdominal aorta is normal in caliber with  atherosclerosis.    The osseous structures are unremarkable.    IMPRESSION:     1. There is bowel wall thickening and pericolonic inflammatory change   involving the cecum, ascending colon, descending colon and sigmoid colon   most concerning for colitis which may secondary to an infectious or   inflammatory etiology.    2. There is a small hiatal hernia with gastroesophageal reflux.  Dictation Site: 1          Verified By: Jennette Banker, M.D., MD   Assessment/Plan:  Assessment/Plan:   Assessment 1. Diarrhea: infectious v. other colitis.  C-diff negative, rest of stool studies pending. Had normal colonscopy 2008. Decreasing stools, decreasing pain. VSS. Heart rate controlled with this hospitalization on current meds. Tolerating FLIQ diet at present. May want to increase to bland/low residue diet if continues to do well with liquids and change antibiotics to po if no N/V. I do not see any problems with getting her out of bed if she is feeling well and stable.   Electronic Signatures: Stephens November H (NP)  (Signed 16-May-13 11:36)  Authored: Chief Complaint, VITAL SIGNS/ANCILLARY NOTES, Brief Assessment, Lab Results, Radiology Results, Assessment/Plan   Last Updated: 16-May-13 11:36 by Theodore Demark (NP)

## 2014-07-23 NOTE — Consult Note (Signed)
Brief Consult Note: Diagnosis: acute onset diarrheal illness.   Patient was seen by consultant.   Consult note dictated.   Recommend further assessment or treatment.   Discussed with Attending MD.   Comments: Please see full GI consult.  Patient presenting with acute n/v diarrhea.  Denies hematemesis or rectal bleeding.  Anothetr family member with similar illness 1-2 weeks ago.  Continue current.  Patient c.diff negative.  Will add cipro to current flagyl.  Stool studies collected, results pending.  Will await relusts of cultures.  If no clinical improvement, will consider flex sig.  Following.  Electronic Signatures: Loistine Simas (MD)  (Signed 14-May-13 14:01)  Authored: Brief Consult Note   Last Updated: 14-May-13 14:01 by Loistine Simas (MD)

## 2014-07-23 NOTE — Discharge Summary (Signed)
PATIENT NAME:  Colleen Nguyen, Colleen Nguyen MR#:  [id number] DATE OF BIRTH:  April 21, 1922  DATE OF ADMISSION:  08/12/2011 DATE OF DISCHARGE:  08/16/2011  ADMITTING PHYSICIAN: Phillips Climes, MD  DISCHARGING PHYSICIAN: Gladstone Lighter, MD  PRIMARY CARE PHYSICIAN: Thereasa Distance, MD  CONSULTANT: Loistine Simas, MD - Gastroenterology.   DISCHARGE DIAGNOSES:  1. Pancolitis.  2. Paroxysmal atrial fibrillation, not on anticoagulation.  3. History of diverticulosis and diverticulitis.  4. Anxiety and depression.  5. Hyperlipidemia.  6. Gastroesophageal reflux disease.  7. Chronic anemia.  8. Back pain and shoulder pain.  9. Coronary artery disease.   DISCHARGE HOME MEDICATIONS:  1. Aspirin 81 mg p.o. daily.  2. Xanax 0.5 mg p.o. twice a day. 3. Cardizem 180 mg p.o. daily.  4. Zofran 4 mg p.o. every six hours p.r.n.  5. Prilosec 20 mg p.o. daily.  6. Motrin p.r.n.  7. Ciprofloxacin 500 mg p.o. twice a day until 08/21/2011.  8. Flagyl 500 mg p.o. three times daily until 08/21/2011. 9. Probiotic capsule 1 capsule p.o. twice a day. 10. Tramadol 50 mg p.o. every six hours p.r.n. for pain.   DISCHARGE ACTIVITY: As tolerated.   DISCHARGE DIET: Low-sodium diet.   FOLLOWUP INSTRUCTIONS: PCP followup in 1 to 2 weeks. Gastroenterology followup in 3 to 4 weeks.  LABS AND IMAGING STUDIES: Sodium 140, potassium 3.8, chloride 107, bicarbonate 23, BUN 12, creatinine 0.85, glucose 88, and calcium 8.5.   WBC 5.4, hemoglobin 11,2, hematocrit 34.3, and platelet count 172.  Stool for C. difficile negative.   CT of the abdomen and pelvis on admission showed clear lung fields. Bowel wall thickening. Pericolonic inflammatory changes involving cecum, ascending colon, descending, and sigmoid colon concerning for colitis secondary to infection or inflammatory etiology. Small hiatal hernia with gastroesophageal reflux disease changes are present.   BRIEF HOSPITAL COURSE: [name] is an 81 year old elderly  female with past medical history significant for hypertension and atrial fibrillation, not on anticoagulation, who comes to the hospital complaining of abdominal pain, diarrhea with melanotic stools, and severe nausea and dry heaves. CT of the abdomen in the emergency room showed pancolitis.   Pancolitis with severe GI symptoms: She was started on a liquid diet and she received Flagyl and Levaquin in the ED, started on Cipro and Flagyl, and gastroenterology consultation was requested while she was in the hospital. Her symptoms started to show slow improvement with fluids and antibiotics. Her stool studies came back negative, WBC improved, and so she is being discharged on oral antibiotics. Also probiotic capsules have been added per gastroenterology recommendations and she can follow up with gastroenterology as an outpatient. Per family she had a colonoscopy three years ago which was normal other than diverticulosis. Other than that her other home medications have been continued. She has atrial fibrillation for which she is on Cardizem and only aspirin without any anticoagulation. She can follow up with her primary care physician about that. She has been on morphine p.r.n. for her chronic back pain and shoulder pain issues and will be changed over to tramadol at the time of discharge.   DISCHARGE CONDITION: Stable.     DISCHARGE DISPOSITION: Home.   TIME SPENT ON DISCHARGE: 40 minutes. ____________________________ Gladstone Lighter, MD rk:slb D: 08/16/2011 09:58:42 ET T: 08/17/2011 12:35:14 ET JOB#: [id number]  cc: Gladstone Lighter, MD, <Dictator> Sofie Hartigan, MD Lollie Sails, MD Gladstone Lighter MD ELECTRONICALLY SIGNED 08/18/2011 11:49

## 2014-09-18 DIAGNOSIS — E780 Pure hypercholesterolemia: Secondary | ICD-10-CM | POA: Diagnosis not present

## 2014-09-25 DIAGNOSIS — K219 Gastro-esophageal reflux disease without esophagitis: Secondary | ICD-10-CM | POA: Diagnosis not present

## 2014-09-25 DIAGNOSIS — F419 Anxiety disorder, unspecified: Secondary | ICD-10-CM | POA: Diagnosis not present

## 2014-09-25 DIAGNOSIS — E780 Pure hypercholesterolemia: Secondary | ICD-10-CM | POA: Diagnosis not present

## 2014-09-25 DIAGNOSIS — I10 Essential (primary) hypertension: Secondary | ICD-10-CM | POA: Diagnosis not present

## 2014-10-11 DIAGNOSIS — I10 Essential (primary) hypertension: Secondary | ICD-10-CM | POA: Diagnosis not present

## 2014-10-11 DIAGNOSIS — I25118 Atherosclerotic heart disease of native coronary artery with other forms of angina pectoris: Secondary | ICD-10-CM | POA: Diagnosis not present

## 2014-10-11 DIAGNOSIS — E782 Mixed hyperlipidemia: Secondary | ICD-10-CM | POA: Diagnosis not present

## 2014-10-11 DIAGNOSIS — R002 Palpitations: Secondary | ICD-10-CM | POA: Diagnosis not present

## 2014-10-18 DIAGNOSIS — I480 Paroxysmal atrial fibrillation: Secondary | ICD-10-CM | POA: Diagnosis not present

## 2014-10-18 DIAGNOSIS — R002 Palpitations: Secondary | ICD-10-CM | POA: Diagnosis not present

## 2014-10-25 DIAGNOSIS — I480 Paroxysmal atrial fibrillation: Secondary | ICD-10-CM | POA: Diagnosis not present

## 2014-10-25 DIAGNOSIS — I25118 Atherosclerotic heart disease of native coronary artery with other forms of angina pectoris: Secondary | ICD-10-CM | POA: Diagnosis not present

## 2014-10-25 DIAGNOSIS — I340 Nonrheumatic mitral (valve) insufficiency: Secondary | ICD-10-CM | POA: Diagnosis not present

## 2014-10-25 DIAGNOSIS — I10 Essential (primary) hypertension: Secondary | ICD-10-CM | POA: Diagnosis not present

## 2014-12-28 DIAGNOSIS — I2510 Atherosclerotic heart disease of native coronary artery without angina pectoris: Secondary | ICD-10-CM | POA: Diagnosis not present

## 2014-12-28 DIAGNOSIS — J028 Acute pharyngitis due to other specified organisms: Secondary | ICD-10-CM | POA: Diagnosis not present

## 2014-12-28 DIAGNOSIS — I071 Rheumatic tricuspid insufficiency: Secondary | ICD-10-CM | POA: Diagnosis not present

## 2014-12-28 DIAGNOSIS — J309 Allergic rhinitis, unspecified: Secondary | ICD-10-CM | POA: Diagnosis not present

## 2014-12-28 DIAGNOSIS — I480 Paroxysmal atrial fibrillation: Secondary | ICD-10-CM | POA: Diagnosis not present

## 2014-12-28 DIAGNOSIS — B9789 Other viral agents as the cause of diseases classified elsewhere: Secondary | ICD-10-CM | POA: Diagnosis not present

## 2014-12-28 DIAGNOSIS — E782 Mixed hyperlipidemia: Secondary | ICD-10-CM | POA: Diagnosis not present

## 2015-01-05 ENCOUNTER — Encounter: Payer: Self-pay | Admitting: Emergency Medicine

## 2015-01-05 ENCOUNTER — Emergency Department
Admission: EM | Admit: 2015-01-05 | Discharge: 2015-01-05 | Disposition: A | Discharge status: Home / Self Care | Payer: Commercial Managed Care - HMO | Attending: Emergency Medicine | Admitting: Emergency Medicine

## 2015-01-05 ENCOUNTER — Emergency Department: Payer: Commercial Managed Care - HMO

## 2015-01-05 DIAGNOSIS — I10 Essential (primary) hypertension: Secondary | ICD-10-CM | POA: Insufficient documentation

## 2015-01-05 DIAGNOSIS — R079 Chest pain, unspecified: Secondary | ICD-10-CM | POA: Diagnosis not present

## 2015-01-05 DIAGNOSIS — R05 Cough: Secondary | ICD-10-CM | POA: Diagnosis present

## 2015-01-05 DIAGNOSIS — J209 Acute bronchitis, unspecified: Secondary | ICD-10-CM | POA: Diagnosis not present

## 2015-01-05 HISTORY — DX: Essential (primary) hypertension: I10

## 2015-01-05 LAB — COMPREHENSIVE METABOLIC PANEL
ALK PHOS: 84 U/L (ref 38–126)
ALT: 11 U/L — AB (ref 14–54)
AST: 17 U/L (ref 15–41)
Albumin: 3.9 g/dL (ref 3.5–5.0)
Anion gap: 7 (ref 5–15)
BILIRUBIN TOTAL: 0.4 mg/dL (ref 0.3–1.2)
BUN: 16 mg/dL (ref 6–20)
CALCIUM: 9.1 mg/dL (ref 8.9–10.3)
CO2: 24 mmol/L (ref 22–32)
CREATININE: 0.87 mg/dL (ref 0.44–1.00)
Chloride: 110 mmol/L (ref 101–111)
GFR calc non Af Amer: 57 mL/min — ABNORMAL LOW (ref >60)
GLUCOSE: 102 mg/dL — AB (ref 65–99)
Potassium: 4.8 mmol/L (ref 3.5–5.1)
SODIUM: 141 mmol/L (ref 135–145)
Total Protein: 7 g/dL (ref 6.5–8.1)

## 2015-01-05 LAB — TROPONIN I: Troponin I: <0.03 ng/mL (ref <0.031)

## 2015-01-05 LAB — CBC
HEMATOCRIT: 32.6 % — AB (ref 35.0–47.0)
HEMOGLOBIN: 10.8 g/dL — AB (ref 12.0–16.0)
MCH: 29 pg (ref 26.0–34.0)
MCHC: 33.1 g/dL (ref 32.0–36.0)
MCV: 87.8 fL (ref 80.0–100.0)
Platelets: 234 10*3/uL (ref 150–440)
RBC: 3.71 MIL/uL — AB (ref 3.80–5.20)
RDW: 14.8 % — ABNORMAL HIGH (ref 11.5–14.5)
WBC: 4.9 10*3/uL (ref 3.6–11.0)

## 2015-01-05 MED ORDER — AZITHROMYCIN 250 MG PO TABS: ORAL_TABLET | ORAL | Status: DC

## 2015-01-05 MED ORDER — ALBUTEROL SULFATE HFA 108 (90 BASE) MCG/ACT IN AERS: 2.0000 | INHALATION_SPRAY | Freq: Four times a day (QID) | RESPIRATORY_TRACT | Status: DC | PRN

## 2015-01-05 MED ORDER — PREDNISONE 10 MG PO TABS: 50.0000 mg | ORAL_TABLET | Freq: Every day | ORAL | Status: DC

## 2015-01-05 NOTE — ED Notes (Signed)
Pt to ED with c/o midsternal chest pain with productive cough x 3 days, also having some left arm pain, states she has been coughing up "gray" color sputum

## 2015-01-05 NOTE — ED Provider Notes (Signed)
[facility] Emergency Department Provider Note  ____________________________________________  Time seen: Approximately 8:43 PM  I have reviewed the triage vital signs and the nursing notes.   HISTORY  Chief Complaint Chest Pain and Cough    HPI Colleen Nguyen is a 81 y.o. female who presents to the emergency department for evaluation of productive cough 3 days with chest tenderness and pain in the left arm with cough. The patient denies fever or shortness of breath. Multiple members of the house have the same cough and congestion for approximately the same length of time.   Past Medical History  Diagnosis Date  . Hypertension     There are no active problems to display for this patient.   History reviewed. No pertinent past surgical history.  Current Outpatient Rx  Name  Route  Sig  Dispense  Refill  . albuterol (PROVENTIL HFA;VENTOLIN HFA) 108 (90 BASE) MCG/ACT inhaler   Inhalation   Inhale 2 puffs into the lungs every 6 (six) hours as needed for wheezing or shortness of breath.   1 Inhaler   2   . azithromycin (ZITHROMAX) 250 MG tablet      Take 2 tablets today and 1 tablet for the next 4 days.   6 each   0   . predniSONE (DELTASONE) 10 MG tablet   Oral   Take 5 tablets (50 mg total) by mouth daily.   25 tablet   0     Allergies Norco; Phenergan; and Pravastatin  No family history on file.  Social History Social History  Substance Use Topics  . Smoking status: Never Smoker   . Smokeless tobacco: None  . Alcohol Use: No    Review of Systems Constitutional: No fever/chills Eyes: No visual changes. ENT: No sore throat. Cardiovascular: Denies chest pain. Respiratory: Negative for shortness of breath. Positive for cough. Gastrointestinal: Negative for abdominal pain. Negative for nausea,  negative for vomiting.  Negative for diarrhea.  Genitourinary: Negative for dysuria. Musculoskeletal: Negative for for body  aches Skin: Negative for for rash. Neurological: Negative for headaches, Negative for focal weakness or numbness.  10-point ROS otherwise negative.  ____________________________________________   PHYSICAL EXAM:  VITAL SIGNS: ED Triage Vitals  Enc Vitals Group     BP 01/05/15 1816 173/67 mmHg     Pulse Rate 01/05/15 1816 66     Resp 01/05/15 1816 18     Temp 01/05/15 1816 98.1 F (36.7 C)     Temp Source 01/05/15 1816 Oral     SpO2 01/05/15 1816 97 %     Weight 01/05/15 1816 110 lb (49.896 kg)     Height 01/05/15 1816 5\' 1"  (1.549 m)     Head Cir --      Peak Flow --      Pain Score 01/05/15 1809 7     Pain Loc --      Pain Edu? --      Excl. in [place]? --     Constitutional: Alert and oriented. Well appearing and in no acute distress. Eyes: Conjunctivae are normal. PERRL. EOMI. Head: Atraumatic. Nose: No congestion/rhinnorhea. Mouth/Throat: Mucous membranes are moist.  Oropharynx non-erythematous. Neck: No stridor.  Lymphatic: No cervical lymphadenopathy. Cardiovascular: Normal rate, regular rhythm. Grossly normal heart sounds.  Good peripheral circulation. Respiratory: Normal respiratory effort.  No retractions. Lungs diminished but clear to auscultation. Gastrointestinal: Soft and nontender. No distention. No abdominal bruits. No CVA tenderness. Musculoskeletal: No joint pain reported. Neurologic:  Normal speech  and language. No gross focal neurologic deficits are appreciated. Speech is normal. No gait instability. Skin:  Skin is warm, dry and intact. No rash noted. Psychiatric: Mood and affect are normal. Speech and behavior are normal.  ____________________________________________   LABS (all labs ordered are listed, but only abnormal results are displayed)  Labs Reviewed  CBC - Abnormal; Notable for the following:    RBC 3.71 (*)    Hemoglobin 10.8 (*)    HCT 32.6 (*)    RDW 14.8 (*)    All other components within normal limits  COMPREHENSIVE METABOLIC PANEL  - Abnormal; Notable for the following:    Glucose, Bld 102 (*)    ALT 11 (*)    GFR calc non Af Amer 57 (*)    All other components within normal limits  TROPONIN I   ____________________________________________  EKG  Normal sinus rhythm with a rate of 68 bpm, normal axis, voltage criteria for LVH is present, normal ST segments. ____________________________________________  RADIOLOGY  Chest x-ray negative for acute cardiopulmonary abnormality. ____________________________________________   PROCEDURES  Procedure(s) performed: None  Critical Care performed: No  ____________________________________________   INITIAL IMPRESSION / ASSESSMENT AND PLAN / ED COURSE  Pertinent labs & imaging results that were available during my care of the patient were reviewed by me and considered in my medical decision making (see chart for details).    Patient was advised to follow up with the primary care provider for symptoms that are not improving over the next 2-3 days. She was advised to return to the emergency department for symptoms that change or worsen if unable to schedule an appointment with the primary care provider. ____________________________________________   FINAL CLINICAL IMPRESSION(S) / ED DIAGNOSES  Final diagnoses:  Acute bronchitis, unspecified organism       Victorino Dike, FNP 01/05/15 2140  Harvest Dark, MD 01/05/15 2318

## 2015-01-05 NOTE — Discharge Instructions (Signed)

## 2015-01-09 DIAGNOSIS — H52223 Regular astigmatism, bilateral: Secondary | ICD-10-CM | POA: Diagnosis not present

## 2015-01-09 DIAGNOSIS — H5213 Myopia, bilateral: Secondary | ICD-10-CM | POA: Diagnosis not present

## 2015-01-09 DIAGNOSIS — H1089 Other conjunctivitis: Secondary | ICD-10-CM | POA: Diagnosis not present

## 2015-01-09 DIAGNOSIS — H353122 Nonexudative age-related macular degeneration, left eye, intermediate dry stage: Secondary | ICD-10-CM | POA: Diagnosis not present

## 2015-01-09 DIAGNOSIS — H524 Presbyopia: Secondary | ICD-10-CM | POA: Diagnosis not present

## 2015-01-09 DIAGNOSIS — I10 Essential (primary) hypertension: Secondary | ICD-10-CM | POA: Diagnosis not present

## 2015-01-09 DIAGNOSIS — H353112 Nonexudative age-related macular degeneration, right eye, intermediate dry stage: Secondary | ICD-10-CM | POA: Diagnosis not present

## 2015-02-02 DIAGNOSIS — Z0100 Encounter for examination of eyes and vision without abnormal findings: Secondary | ICD-10-CM | POA: Diagnosis not present

## 2015-03-19 DIAGNOSIS — G8929 Other chronic pain: Secondary | ICD-10-CM | POA: Diagnosis not present

## 2015-03-19 DIAGNOSIS — E7800 Pure hypercholesterolemia, unspecified: Secondary | ICD-10-CM | POA: Diagnosis not present

## 2015-03-19 DIAGNOSIS — M546 Pain in thoracic spine: Secondary | ICD-10-CM | POA: Diagnosis not present

## 2015-03-22 ENCOUNTER — Other Ambulatory Visit: Payer: Self-pay | Admitting: Nurse Practitioner

## 2015-03-22 DIAGNOSIS — M546 Pain in thoracic spine: Principal | ICD-10-CM

## 2015-03-22 DIAGNOSIS — M47814 Spondylosis without myelopathy or radiculopathy, thoracic region: Secondary | ICD-10-CM | POA: Diagnosis not present

## 2015-03-22 DIAGNOSIS — M549 Dorsalgia, unspecified: Secondary | ICD-10-CM | POA: Diagnosis not present

## 2015-03-22 DIAGNOSIS — G8929 Other chronic pain: Secondary | ICD-10-CM | POA: Diagnosis not present

## 2015-03-22 DIAGNOSIS — R5381 Other malaise: Secondary | ICD-10-CM | POA: Diagnosis not present

## 2015-03-28 DIAGNOSIS — I480 Paroxysmal atrial fibrillation: Secondary | ICD-10-CM | POA: Diagnosis not present

## 2015-03-28 DIAGNOSIS — K219 Gastro-esophageal reflux disease without esophagitis: Secondary | ICD-10-CM | POA: Diagnosis not present

## 2015-03-28 DIAGNOSIS — G8929 Other chronic pain: Secondary | ICD-10-CM | POA: Diagnosis not present

## 2015-03-28 DIAGNOSIS — M129 Arthropathy, unspecified: Secondary | ICD-10-CM | POA: Diagnosis not present

## 2015-03-28 DIAGNOSIS — M546 Pain in thoracic spine: Secondary | ICD-10-CM | POA: Diagnosis not present

## 2015-03-28 DIAGNOSIS — E7800 Pure hypercholesterolemia, unspecified: Secondary | ICD-10-CM | POA: Diagnosis not present

## 2015-03-28 DIAGNOSIS — F419 Anxiety disorder, unspecified: Secondary | ICD-10-CM | POA: Diagnosis not present

## 2015-03-28 DIAGNOSIS — I10 Essential (primary) hypertension: Secondary | ICD-10-CM | POA: Diagnosis not present

## 2015-04-09 ENCOUNTER — Ambulatory Visit: Payer: Commercial Managed Care - HMO

## 2015-04-17 ENCOUNTER — Ambulatory Visit: Payer: Commercial Managed Care - HMO

## 2015-05-31 DIAGNOSIS — F419 Anxiety disorder, unspecified: Secondary | ICD-10-CM | POA: Diagnosis not present

## 2015-05-31 DIAGNOSIS — G8929 Other chronic pain: Secondary | ICD-10-CM | POA: Diagnosis not present

## 2015-05-31 DIAGNOSIS — M546 Pain in thoracic spine: Secondary | ICD-10-CM | POA: Diagnosis not present

## 2015-06-05 DIAGNOSIS — I480 Paroxysmal atrial fibrillation: Secondary | ICD-10-CM | POA: Diagnosis not present

## 2015-06-05 DIAGNOSIS — E782 Mixed hyperlipidemia: Secondary | ICD-10-CM | POA: Diagnosis not present

## 2015-06-05 DIAGNOSIS — N183 Chronic kidney disease, stage 3 (moderate): Secondary | ICD-10-CM | POA: Diagnosis not present

## 2015-06-05 DIAGNOSIS — I2510 Atherosclerotic heart disease of native coronary artery without angina pectoris: Secondary | ICD-10-CM | POA: Diagnosis not present

## 2015-06-25 ENCOUNTER — Ambulatory Visit
Admission: RE | Admit: 2015-06-25 | Discharge: 2015-06-25 | Disposition: A | Discharge status: Home / Self Care | Payer: Commercial Managed Care - HMO | Source: Ambulatory Visit | Attending: Nurse Practitioner | Admitting: Nurse Practitioner

## 2015-06-25 DIAGNOSIS — M546 Pain in thoracic spine: Secondary | ICD-10-CM | POA: Insufficient documentation

## 2015-06-25 DIAGNOSIS — M40294 Other kyphosis, thoracic region: Secondary | ICD-10-CM | POA: Diagnosis not present

## 2015-06-25 DIAGNOSIS — M4184 Other forms of scoliosis, thoracic region: Secondary | ICD-10-CM | POA: Diagnosis not present

## 2015-06-25 DIAGNOSIS — G8929 Other chronic pain: Secondary | ICD-10-CM | POA: Diagnosis not present

## 2015-07-04 DIAGNOSIS — G8929 Other chronic pain: Secondary | ICD-10-CM | POA: Diagnosis not present

## 2015-07-04 DIAGNOSIS — M546 Pain in thoracic spine: Secondary | ICD-10-CM | POA: Diagnosis not present

## 2015-07-22 ENCOUNTER — Encounter: Payer: Self-pay | Admitting: Emergency Medicine

## 2015-07-22 ENCOUNTER — Emergency Department: Payer: Commercial Managed Care - HMO

## 2015-07-22 ENCOUNTER — Inpatient Hospital Stay
Admission: EM | Admit: 2015-07-22 | Discharge: 2015-07-24 | DRG: 378 | Disposition: A | Discharge status: Home / Self Care | Payer: Commercial Managed Care - HMO | Attending: Internal Medicine | Admitting: Internal Medicine

## 2015-07-22 ENCOUNTER — Other Ambulatory Visit: Payer: Self-pay

## 2015-07-22 DIAGNOSIS — I959 Hypotension, unspecified: Secondary | ICD-10-CM | POA: Diagnosis not present

## 2015-07-22 DIAGNOSIS — Z7901 Long term (current) use of anticoagulants: Secondary | ICD-10-CM

## 2015-07-22 DIAGNOSIS — Z86010 Personal history of colonic polyps: Secondary | ICD-10-CM | POA: Diagnosis not present

## 2015-07-22 DIAGNOSIS — Z681 Body mass index (BMI) 19 or less, adult: Secondary | ICD-10-CM

## 2015-07-22 DIAGNOSIS — R531 Weakness: Secondary | ICD-10-CM

## 2015-07-22 DIAGNOSIS — Z79899 Other long term (current) drug therapy: Secondary | ICD-10-CM | POA: Diagnosis not present

## 2015-07-22 DIAGNOSIS — E440 Moderate protein-calorie malnutrition: Secondary | ICD-10-CM | POA: Insufficient documentation

## 2015-07-22 DIAGNOSIS — I2510 Atherosclerotic heart disease of native coronary artery without angina pectoris: Secondary | ICD-10-CM | POA: Diagnosis not present

## 2015-07-22 DIAGNOSIS — D500 Iron deficiency anemia secondary to blood loss (chronic): Secondary | ICD-10-CM | POA: Diagnosis not present

## 2015-07-22 DIAGNOSIS — K222 Esophageal obstruction: Secondary | ICD-10-CM | POA: Diagnosis present

## 2015-07-22 DIAGNOSIS — Z9049 Acquired absence of other specified parts of digestive tract: Secondary | ICD-10-CM | POA: Diagnosis not present

## 2015-07-22 DIAGNOSIS — Z885 Allergy status to narcotic agent status: Secondary | ICD-10-CM | POA: Diagnosis not present

## 2015-07-22 DIAGNOSIS — N170 Acute kidney failure with tubular necrosis: Secondary | ICD-10-CM | POA: Diagnosis not present

## 2015-07-22 DIAGNOSIS — D649 Anemia, unspecified: Secondary | ICD-10-CM | POA: Diagnosis not present

## 2015-07-22 DIAGNOSIS — Z806 Family history of leukemia: Secondary | ICD-10-CM | POA: Diagnosis not present

## 2015-07-22 DIAGNOSIS — T461X5A Adverse effect of calcium-channel blockers, initial encounter: Secondary | ICD-10-CM | POA: Diagnosis not present

## 2015-07-22 DIAGNOSIS — D62 Acute posthemorrhagic anemia: Secondary | ICD-10-CM | POA: Diagnosis not present

## 2015-07-22 DIAGNOSIS — K2970 Gastritis, unspecified, without bleeding: Secondary | ICD-10-CM | POA: Diagnosis present

## 2015-07-22 DIAGNOSIS — Z823 Family history of stroke: Secondary | ICD-10-CM | POA: Diagnosis not present

## 2015-07-22 DIAGNOSIS — D490 Neoplasm of unspecified behavior of digestive system: Secondary | ICD-10-CM | POA: Diagnosis present

## 2015-07-22 DIAGNOSIS — I482 Chronic atrial fibrillation, unspecified: Secondary | ICD-10-CM

## 2015-07-22 DIAGNOSIS — D509 Iron deficiency anemia, unspecified: Secondary | ICD-10-CM | POA: Insufficient documentation

## 2015-07-22 DIAGNOSIS — Z90710 Acquired absence of both cervix and uterus: Secondary | ICD-10-CM | POA: Diagnosis not present

## 2015-07-22 DIAGNOSIS — Z8249 Family history of ischemic heart disease and other diseases of the circulatory system: Secondary | ICD-10-CM | POA: Diagnosis not present

## 2015-07-22 DIAGNOSIS — K5730 Diverticulosis of large intestine without perforation or abscess without bleeding: Secondary | ICD-10-CM | POA: Diagnosis present

## 2015-07-22 DIAGNOSIS — Z888 Allergy status to other drugs, medicaments and biological substances status: Secondary | ICD-10-CM | POA: Diagnosis not present

## 2015-07-22 DIAGNOSIS — I248 Other forms of acute ischemic heart disease: Secondary | ICD-10-CM | POA: Diagnosis present

## 2015-07-22 DIAGNOSIS — K5909 Other constipation: Secondary | ICD-10-CM | POA: Diagnosis present

## 2015-07-22 DIAGNOSIS — R262 Difficulty in walking, not elsewhere classified: Secondary | ICD-10-CM | POA: Diagnosis not present

## 2015-07-22 DIAGNOSIS — F419 Anxiety disorder, unspecified: Secondary | ICD-10-CM | POA: Diagnosis not present

## 2015-07-22 DIAGNOSIS — I480 Paroxysmal atrial fibrillation: Secondary | ICD-10-CM | POA: Diagnosis not present

## 2015-07-22 DIAGNOSIS — I952 Hypotension due to drugs: Secondary | ICD-10-CM | POA: Diagnosis not present

## 2015-07-22 DIAGNOSIS — K922 Gastrointestinal hemorrhage, unspecified: Principal | ICD-10-CM

## 2015-07-22 DIAGNOSIS — I10 Essential (primary) hypertension: Secondary | ICD-10-CM | POA: Diagnosis present

## 2015-07-22 DIAGNOSIS — R001 Bradycardia, unspecified: Secondary | ICD-10-CM | POA: Diagnosis not present

## 2015-07-22 DIAGNOSIS — R7989 Other specified abnormal findings of blood chemistry: Secondary | ICD-10-CM | POA: Diagnosis not present

## 2015-07-22 DIAGNOSIS — I712 Thoracic aortic aneurysm, without rupture: Secondary | ICD-10-CM | POA: Diagnosis not present

## 2015-07-22 DIAGNOSIS — I4581 Long QT syndrome: Secondary | ICD-10-CM | POA: Diagnosis not present

## 2015-07-22 DIAGNOSIS — K6389 Other specified diseases of intestine: Secondary | ICD-10-CM | POA: Diagnosis not present

## 2015-07-22 DIAGNOSIS — K2940 Chronic atrophic gastritis without bleeding: Secondary | ICD-10-CM | POA: Diagnosis not present

## 2015-07-22 DIAGNOSIS — R778 Other specified abnormalities of plasma proteins: Secondary | ICD-10-CM | POA: Diagnosis not present

## 2015-07-22 HISTORY — DX: Anemia, unspecified: D64.9

## 2015-07-22 HISTORY — DX: Unspecified atrial fibrillation: I48.91

## 2015-07-22 HISTORY — DX: Anxiety disorder, unspecified: F41.9

## 2015-07-22 LAB — CBC WITH DIFFERENTIAL/PLATELET
BASOS ABS: 0 10*3/uL (ref 0–0.1)
Basophils Relative: 1 %
EOS PCT: 4 %
Eosinophils Absolute: 0.1 10*3/uL (ref 0–0.7)
HCT: 22.4 % — ABNORMAL LOW (ref 35.0–47.0)
Hemoglobin: 6.6 g/dL — ABNORMAL LOW (ref 12.0–16.0)
LYMPHS PCT: 42 %
Lymphs Abs: 1.1 10*3/uL (ref 1.0–3.6)
MCH: 21.5 pg — AB (ref 26.0–34.0)
MCHC: 29.5 g/dL — AB (ref 32.0–36.0)
MCV: 72.8 fL — AB (ref 80.0–100.0)
MONO ABS: 0.5 10*3/uL (ref 0.2–0.9)
MONOS PCT: 17 %
Neutro Abs: 1 10*3/uL — ABNORMAL LOW (ref 1.4–6.5)
Neutrophils Relative %: 36 %
PLATELETS: 189 10*3/uL (ref 150–440)
RBC: 3.08 MIL/uL — ABNORMAL LOW (ref 3.80–5.20)
RDW: 19.7 % — AB (ref 11.5–14.5)
WBC: 2.7 10*3/uL — ABNORMAL LOW (ref 3.6–11.0)

## 2015-07-22 LAB — TROPONIN I
Troponin I: <0.03 ng/mL (ref <0.031)
Troponin I: 0.09 ng/mL — ABNORMAL HIGH (ref <0.031)

## 2015-07-22 LAB — URINALYSIS COMPLETE WITH MICROSCOPIC (ARMC ONLY)
BACTERIA UA: NONE SEEN
Bilirubin Urine: NEGATIVE
GLUCOSE, UA: NEGATIVE mg/dL
Hgb urine dipstick: NEGATIVE
Ketones, ur: NEGATIVE mg/dL
Leukocytes, UA: NEGATIVE
Nitrite: NEGATIVE
PROTEIN: NEGATIVE mg/dL
Specific Gravity, Urine: 1.012 (ref 1.005–1.030)
pH: 7 (ref 5.0–8.0)

## 2015-07-22 LAB — BASIC METABOLIC PANEL
Anion gap: 4 — ABNORMAL LOW (ref 5–15)
BUN: 15 mg/dL (ref 6–20)
CALCIUM: 8.4 mg/dL — AB (ref 8.9–10.3)
CO2: 23 mmol/L (ref 22–32)
CREATININE: 0.84 mg/dL (ref 0.44–1.00)
Chloride: 114 mmol/L — ABNORMAL HIGH (ref 101–111)
GFR calc Af Amer: >60 mL/min (ref >60)
GFR, EST NON AFRICAN AMERICAN: 59 mL/min — AB (ref >60)
GLUCOSE: 108 mg/dL — AB (ref 65–99)
Potassium: 3.8 mmol/L (ref 3.5–5.1)
Sodium: 141 mmol/L (ref 135–145)

## 2015-07-22 LAB — ABO/RH: ABO/RH(D): O POS

## 2015-07-22 LAB — PREPARE RBC (CROSSMATCH)

## 2015-07-22 LAB — GLUCOSE, CAPILLARY: GLUCOSE-CAPILLARY: 88 mg/dL (ref 65–99)

## 2015-07-22 MED ORDER — ASPIRIN EC 81 MG PO TBEC
81.0000 mg | DELAYED_RELEASE_TABLET | Freq: Every day | ORAL | Status: DC
  Filled 2015-07-22: qty 1

## 2015-07-22 MED ORDER — PEG 3350-KCL-NA BICARB-NACL 420 G PO SOLR
4000.0000 mL | Freq: Once | ORAL | Status: AC
  Administered 2015-07-22: 4000 mL via ORAL
  Filled 2015-07-22 (×2): qty 4000

## 2015-07-22 MED ORDER — SODIUM CHLORIDE 0.9% FLUSH
3.0000 mL | Freq: Two times a day (BID) | INTRAVENOUS | Status: DC
  Administered 2015-07-24: 08:00:00 3 mL via INTRAVENOUS

## 2015-07-22 MED ORDER — HYDROCODONE-ACETAMINOPHEN 5-325 MG PO TABS
1.0000 | ORAL_TABLET | Freq: Four times a day (QID) | ORAL | Status: DC | PRN
  Administered 2015-07-22 – 2015-07-24 (×3): 1 via ORAL
  Filled 2015-07-22 (×4): qty 1

## 2015-07-22 MED ORDER — LISINOPRIL 20 MG PO TABS
20.0000 mg | ORAL_TABLET | Freq: Every day | ORAL | Status: DC
  Administered 2015-07-22 – 2015-07-24 (×2): 20 mg via ORAL
  Filled 2015-07-22 (×2): qty 1

## 2015-07-22 MED ORDER — DILTIAZEM HCL ER COATED BEADS 180 MG PO CP24
180.0000 mg | ORAL_CAPSULE | Freq: Every day | ORAL | Status: DC
  Administered 2015-07-22 – 2015-07-24 (×2): 180 mg via ORAL
  Filled 2015-07-22 (×2): qty 1

## 2015-07-22 MED ORDER — ALPRAZOLAM 0.5 MG PO TABS
0.5000 mg | ORAL_TABLET | Freq: Three times a day (TID) | ORAL | Status: DC | PRN
  Administered 2015-07-24: 0.5 mg via ORAL
  Filled 2015-07-22: qty 1

## 2015-07-22 MED ORDER — ONDANSETRON HCL 4 MG PO TABS: 4.0000 mg | ORAL_TABLET | Freq: Four times a day (QID) | ORAL | Status: DC | PRN

## 2015-07-22 MED ORDER — DOCUSATE SODIUM 100 MG PO CAPS
100.0000 mg | ORAL_CAPSULE | Freq: Two times a day (BID) | ORAL | Status: DC
Start: 2015-07-22 — End: 2015-07-24
  Administered 2015-07-24: 100 mg via ORAL
  Filled 2015-07-22 (×2): qty 1

## 2015-07-22 MED ORDER — PANTOPRAZOLE SODIUM 40 MG IV SOLR
40.0000 mg | Freq: Two times a day (BID) | INTRAVENOUS | Status: DC
  Administered 2015-07-22 – 2015-07-24 (×5): 40 mg via INTRAVENOUS
  Filled 2015-07-22 (×5): qty 40

## 2015-07-22 MED ORDER — IOPAMIDOL (ISOVUE-370) INJECTION 76%
100.0000 mL | Freq: Once | INTRAVENOUS | Status: AC | PRN
  Administered 2015-07-22: 100 mL via INTRAVENOUS

## 2015-07-22 MED ORDER — BISACODYL 10 MG RE SUPP: 10.0000 mg | Freq: Every day | RECTAL | Status: DC | PRN

## 2015-07-22 MED ORDER — OXYCODONE-ACETAMINOPHEN 5-325 MG PO TABS
1.0000 | ORAL_TABLET | Freq: Once | ORAL | Status: AC
  Administered 2015-07-22: 1 via ORAL
  Filled 2015-07-22: qty 1

## 2015-07-22 MED ORDER — ONDANSETRON HCL 4 MG/2ML IJ SOLN
4.0000 mg | Freq: Four times a day (QID) | INTRAMUSCULAR | Status: DC | PRN
  Administered 2015-07-23: 02:00:00 4 mg via INTRAVENOUS
  Filled 2015-07-22: qty 2

## 2015-07-22 MED ORDER — ACETAMINOPHEN 325 MG PO TABS: 650.0000 mg | ORAL_TABLET | Freq: Four times a day (QID) | ORAL | Status: DC | PRN

## 2015-07-22 MED ORDER — ALBUTEROL SULFATE HFA 108 (90 BASE) MCG/ACT IN AERS: 2.0000 | INHALATION_SPRAY | Freq: Four times a day (QID) | RESPIRATORY_TRACT | Status: DC | PRN

## 2015-07-22 MED ORDER — TIZANIDINE HCL 4 MG PO TABS
4.0000 mg | ORAL_TABLET | Freq: Three times a day (TID) | ORAL | Status: DC | PRN
  Administered 2015-07-24: 4 mg via ORAL
  Filled 2015-07-22: qty 1

## 2015-07-22 MED ORDER — ALBUTEROL SULFATE (2.5 MG/3ML) 0.083% IN NEBU: 2.5000 mg | INHALATION_SOLUTION | Freq: Four times a day (QID) | RESPIRATORY_TRACT | Status: DC | PRN

## 2015-07-22 MED ORDER — SODIUM CHLORIDE 0.9 % IV SOLN
INTRAVENOUS | Status: DC
  Administered 2015-07-22 (×2): via INTRAVENOUS
  Administered 2015-07-23: 1000 mL via INTRAVENOUS
  Administered 2015-07-24: 01:00:00 via INTRAVENOUS

## 2015-07-22 MED ORDER — METOPROLOL TARTRATE 25 MG PO TABS
25.0000 mg | ORAL_TABLET | Freq: Two times a day (BID) | ORAL | Status: DC
  Administered 2015-07-22 – 2015-07-24 (×2): 25 mg via ORAL
  Filled 2015-07-22 (×3): qty 1

## 2015-07-22 MED ORDER — SODIUM CHLORIDE 0.9 % IV SOLN
10.0000 mL/h | Freq: Once | INTRAVENOUS | Status: AC
  Administered 2015-07-22: 10 mL/h via INTRAVENOUS

## 2015-07-22 MED ORDER — MORPHINE SULFATE (PF) 2 MG/ML IV SOLN
2.0000 mg | INTRAVENOUS | Status: DC | PRN
  Administered 2015-07-23 (×2): 2 mg via INTRAVENOUS
  Filled 2015-07-22 (×2): qty 1

## 2015-07-22 MED ORDER — ACETAMINOPHEN 650 MG RE SUPP
650.0000 mg | Freq: Four times a day (QID) | RECTAL | Status: DC | PRN
Start: 2015-07-22 — End: 2015-07-24

## 2015-07-22 NOTE — Consult Note (Signed)
GI Consultation Note  Referring Provider: Idelle Crouch, MD Date of Consult: 07/22/2015  HPI: Colleen Kizzie Czarnecki is a 82 y.o. female being seen in consultation at the request of Idelle Crouch, MD for anemia.  Colleen Sherryann Colon is a 82 yo woman with PMH significant for HTN, atrial fibrillation, and anemia who presented with more profound anemia today. She reports that she had dark black stools 2 months ago, but for the past 2 months her stools have been a light brown color. She notes that more recently she has felt weak and dizzy, to the point that she might pass out.  She has also noted an increasing RUQ pain, without any nausea or vomiting.  She has lost approximately 15 pounds in the past 2 years. She has been dealing with chronic constipation. She notes that she has dealt with repeated bouts of diverticulitis in the past, but she and her primary caregiver are unclear on whether she ever had a colonoscopy.  It is also unclear whether she ever had an upper endoscopy. She notes that her appetite has been stable during this time. She denies other sources of blood loss. She would like to pursue EGD and colonoscopy if possible to evaluate for sources of bleeding.  PMH: Past Medical History  Diagnosis Date  . Hypertension   . Atrial fibrillation ([place])   . Anxiety   . Anemia     PSH: Past Surgical History  Procedure Laterality Date  . Appendectomy    . Abdominal hysterectomy      Family History: Family History  Problem Relation Age of Onset  . Leukemia Mother   . Stroke Father   . CAD Brother     Social History: Social History   Social History  . Marital Status: Widowed    Spouse Name: N/A  . Number of Children: N/A  . Years of Education: N/A   Occupational History  . Not on file.   Social History Main Topics  . Smoking status: Never Smoker   . Smokeless tobacco: Not on file  . Alcohol Use: No  . Drug Use: Not on file  . Sexual Activity: Not on file    Other Topics Concern  . Not on file   Social History Narrative    ROS: The balance of a 12 system review is negative other than as described in the HPI.  SCHEDULED MEDS: . diltiazem  180 mg Oral Daily  . docusate sodium  100 mg Oral BID  . lisinopril  20 mg Oral Daily  . metoprolol tartrate  25 mg Oral BID  . pantoprazole (PROTONIX) IV  40 mg Intravenous Q12H  . sodium chloride flush  3 mL Intravenous Q12H    PHYSICAL EXAM: Filed Vitals:   07/22/15 1638 07/22/15 1713  BP: 160/64 160/78  Pulse: 57 68  Temp: 97.7 F (36.5 C) 97.7 F (36.5 C)  Resp: 18 18    GEN: Alert, oriented x3 in no apparent distress. Thin frail appearing woman lying in bed. HEENT: Oropharynx clear. Anicteric CV: Nl rate, nl rhythm. No murmurs, rubs or gallops. LUNGS: Clear to auscultation bilaterally. No wheezes, rales or rhonchi. ABD: Bowel sounds present. Abdomen soft, mildly ttp in the RUQ, nondistended. EXT: no edema NEURO: no focal neurologic deficits.  LABS: CBC Latest Ref Rng 07/22/2015 01/05/2015 04/26/2012  WBC 3.6 - 11.0 K/uL 2.7(L) 4.9 5.9  Hemoglobin 12.0 - 16.0 g/dL 6.6(L) 10.8(L) 13.0  Hematocrit 35.0 - 47.0 % 22.4(L) 32.6(L) 40.1  Platelets  150 - 440 K/uL 189 234 213    BMP Latest Ref Rng 07/22/2015 01/05/2015 04/26/2012  Glucose 65 - 99 mg/dL 108(H) 102(H) 97  BUN 6 - 20 mg/dL _0 Creatinine 0.44 - 1.00 mg/dL 0.84 0.87 0.82  Sodium 135 - 145 mmol/L 141 141 137  Potassium 3.5 - 5.1 mmol/L 3.8 4.8 3.6  Chloride 101 - 111 mmol/L 114(H) 110 104  CO2 22 - 32 mmol/L _1 Calcium 8.9 - 10.3 mg/dL 8.4(L) 9.1 8.8    Hepatic Function Latest Ref Rng 01/05/2015 04/26/2012 08/22/2011  Total Protein 6.5 - 8.1 g/dL 7.0 8.3(H) 6.4  Albumin 3.5 - 5.0 g/dL 3.9 4.0 2.7(L)  AST 15 - 41 U/L _2 ALT 14 - 54 U/L 11(L) 15 12  Alk Phosphatase 38 - 126 U/L 84 127 73  Total Bilirubin 0.3 - 1.2 mg/dL 0.4 0.2 0.2    ASSESSMENT: Colleen Nguyen is a 82 y.o. female with PMH  significant for atrial fibrillation on anticoagulation, anemia, and HTN, now presenting with worsening anemia. As it is unclear if she has ever undergone endoscopic evaluation, this seems warranted given the family and the patient's concern for a GI source of bleeding and chronic anemia. She does have a borderline microcytosis and her Hgb on admission of 6.6 is even lower than her chronic anemia. As such she should prep for EGD/Colonoscopy to evaluate for both upper and lower sources of GI bleeding. She expressed understanding. She has chronic constipation and may require 2 days of prep, which she understands.  RECOMMENDATIONS: - prep for EGD/Colonoscopy - clear liquids today - NPO after MN - GoLytely 4L - continue to monitor CBC - agree with PRBC transfusion - hold anticoagulation - further decisions after procedures  Royce Stegman L. Drema Dallas, MD, MPH

## 2015-07-22 NOTE — ED Notes (Signed)
Patient brought in by [facility] from home for weakness. Patient has been having back spasms for the past few weeks, was prescribed muscle relaxer. This morning patient took a xanax tablet because she was feeling anxious. EMS reports that patients BP went from 100/50 when sitting to 80/50 when standing. Patient was given 375cc of fluids by EMS with pressure improving to 110/48.

## 2015-07-22 NOTE — ED Notes (Signed)
Attempted to call report to [facility], [place]. RN will call back to get report

## 2015-07-22 NOTE — Consult Note (Signed)
Chaplain order received to offer Advance Directive education to patient. Reviewed documents, answered questions with patient and offered prayers for healing and health requested by patient.  She will review with her daughter & son and contact department for completion.  Patient goes by name "Colleen Nguyen".

## 2015-07-22 NOTE — ED Provider Notes (Signed)
[facility] Emergency Department Provider Note  ____________________________________________  Time seen: On EMS arrival  I have reviewed the triage vital signs and the nursing notes.   HISTORY  Chief Complaint Dizziness   History limited by: Not Limited   HPI Colleen Nguyen is a 82 y.o. female who presents to the emergency department today via EMS. The patient states that this morning she felt very tired. She stated that her daughter had a hard time awakening her. The patient states that she did take her muscle relaxant and a full dose of her Ativan which she normally only takes a half. The patient states that she had some associated stomach discomfort earlier this morning. Denies any vomiting. Denies any recent diarrhea. Denies any fevers.She was found to be orthostatic for EMS.     Past Medical History  Diagnosis Date  . Hypertension   . Atrial fibrillation ([place])     There are no active problems to display for this patient.   Past Surgical History  Procedure Laterality Date  . Appendectomy    . Abdominal hysterectomy      Current Outpatient Rx  Name  Route  Sig  Dispense  Refill  . albuterol (PROVENTIL HFA;VENTOLIN HFA) 108 (90 BASE) MCG/ACT inhaler   Inhalation   Inhale 2 puffs into the lungs every 6 (six) hours as needed for wheezing or shortness of breath.   1 Inhaler   2   . azithromycin (ZITHROMAX) 250 MG tablet      Take 2 tablets today and 1 tablet for the next 4 days.   6 each   0   . predniSONE (DELTASONE) 10 MG tablet   Oral   Take 5 tablets (50 mg total) by mouth daily.   25 tablet   0     Allergies Norco; Phenergan; and Pravastatin  No family history on file.  Social History Social History  Substance Use Topics  . Smoking status: Never Smoker   . Smokeless tobacco: None  . Alcohol Use: No    Review of Systems  Constitutional: Negative for fever. Cardiovascular: Negative for chest  pain. Respiratory: Negative for shortness of breath. Gastrointestinal: Negative for abdominal pain, vomiting and diarrhea. Neurological: Negative for headaches, focal weakness or numbness.  10-point ROS otherwise negative.  ____________________________________________   PHYSICAL EXAM:  VITAL SIGNS: ED Triage Vitals  Enc Vitals Group     BP 07/22/15 1157 116/49 mmHg     Pulse Rate 07/22/15 1157 70     Resp 07/22/15 1157 96     Temp 07/22/15 1157 97.3 F (36.3 C)     Temp Source 07/22/15 1157 Oral     SpO2 07/22/15 1152 100 %     Weight 07/22/15 1157 106 lb (48.081 kg)     Height 07/22/15 1157 5\' 1"  (1.549 m)     Head Cir --      Peak Flow --      Pain Score 07/22/15 1158 6   Constitutional: Alert and oriented. Well appearing and in no distress. Eyes: Conjunctivae are normal. PERRL. Normal extraocular movements. ENT   Head: Normocephalic and atraumatic.   Nose: No congestion/rhinnorhea.   Mouth/Throat: Mucous membranes are moist.   Neck: No stridor. Hematological/Lymphatic/Immunilogical: No cervical lymphadenopathy. Cardiovascular: Regular rate, regular rhythm.  No murmurs, rubs, or gallops. Respiratory: Normal respiratory effort without tachypnea nor retractions. Breath sounds are clear and equal bilaterally. No wheezes/rales/rhonchi. Gastrointestinal: Soft and nontender. No distention. There is no CVA tenderness. Rectal: No  external lesions. No blood on glove. GUIAC negative. Musculoskeletal: Normal range of motion in all extremities. No joint effusions.  No lower extremity tenderness nor edema. Neurologic:  Normal speech and language. No gross focal neurologic deficits are appreciated.  Skin:  Skin is warm, dry and intact. No rash noted. Psychiatric: Mood and affect are normal. Speech and behavior are normal. Patient exhibits appropriate insight and judgment.  ____________________________________________    LABS (pertinent positives/negatives)  Labs  Reviewed  CBC WITH DIFFERENTIAL/PLATELET - Abnormal; Notable for the following:    WBC 2.7 (*)    RBC 3.08 (*)    Hemoglobin 6.6 (*)    HCT 22.4 (*)    MCV 72.8 (*)    MCH 21.5 (*)    MCHC 29.5 (*)    RDW 19.7 (*)    Neutro Abs 1.0 (*)    All other components within normal limits  BASIC METABOLIC PANEL - Abnormal; Notable for the following:    Chloride 114 (*)    Glucose, Bld 108 (*)    Calcium 8.4 (*)    GFR calc non Af Amer 59 (*)    Anion gap 4 (*)    All other components within normal limits  TROPONIN I  GLUCOSE, CAPILLARY  URINALYSIS COMPLETEWITH MICROSCOPIC ([facility] ONLY)  TYPE AND SCREEN  PREPARE RBC (CROSSMATCH)  ABO/RH     ____________________________________________   EKG  I, Nance Pear, attending physician, personally viewed and interpreted this EKG  EKG Time: 1202 Rate: 59 Rhythm: normal sinus rhythm Axis: normal Intervals: qtc 440 QRS: narrow, q waves V1-V3 ST changes: no st elevation Impression: abnormal ekg ____________________________________________    RADIOLOGY  CT angio  IMPRESSION: Mild aneurysm of ascending thoracic aorta measuring 4.1 cm. No evidence of aortic dissection or other acute findings.  No evidence of abdominal aortic or iliac artery aneurysm or dissection.  Colonic diverticulosis. No radiographic evidence of diverticulitis or other acute findings.  ____________________________________________   PROCEDURES  Procedure(s) performed: None  Critical Care performed: Yes  CRITICAL CARE Performed by: Nance Pear   Total critical care time: 35 minutes  Critical care time was exclusive of separately billable procedures and treating other patients.  Critical care was necessary to treat or prevent imminent or life-threatening deterioration.  Critical care was time spent personally by me on the following activities: development of treatment plan with patient and/or surrogate as well as nursing, discussions with  consultants, evaluation of patient's response to treatment, examination of patient, obtaining history from patient or surrogate, ordering and performing treatments and interventions, ordering and review of laboratory studies, ordering and review of radiographic studies, pulse oximetry and re-evaluation of patient's condition.   ____________________________________________   INITIAL IMPRESSION / ASSESSMENT AND PLAN / ED COURSE  Pertinent labs & imaging results that were available during my care of the patient were reviewed by me and considered in my medical decision making (see chart for details).  Patient presented to the emergency department today because of concerns for decreased responsiveness this morning. Per EMS report the patient full dose of anxiolytic and a muscle relaxant. There was some thought initially that this could be the cause of the patient's decreased responsiveness and relative hypotension. Blood work here however does show anemia to 6.6. The patient was guaiac negative. The patient does not report any bloody stools or bloody cough. Patient is on Eliquis however no obvious bleeding at this time. I think this is likely more a chronic anemia. In addition she is leukopenic. Platelets are  normal. Will plan on blood transfusions and admission to the hospital service.  ____________________________________________   FINAL CLINICAL IMPRESSION(S) / ED DIAGNOSES  Final diagnoses:  Anemia, unspecified anemia type     Nance Pear, MD 07/22/15 1359

## 2015-07-22 NOTE — H&P (Signed)
History and Physical    Colleen Nguyen [id number] DOB: 18-Nov-1922 DOA: 07/22/2015  Referring physician: Dr. Archie Balboa PCP: [facility], Chrissie Noa, MD  Specialists: Dr. Nehemiah Massed  Chief Complaint: weakness  HPI: Colleen Nguyen is a 82 y.o. female has a past medical history significant for HTN, anxiety, and chronic A-fib on Eliquis now with severe weakness and back pain. Has had some black BM's at home. Family noted pt to be hypotensive at home and brought her to the ER where she was noted to be extremely anemic. She is now admitted. Denies Cp or SOB. No N/V/D. Denies abdominal pain.  Review of Systems: The patient denies anorexia, fever, weight loss,, vision loss, decreased hearing, hoarseness, chest pain, syncope, dyspnea on exertion, peripheral edema, balance deficits, hemoptysis, abdominal pain, melena, hematochezia, severe indigestion/heartburn, hematuria, incontinence, genital sores, muscle weakness, suspicious skin lesions, transient blindness, difficulty walking, depression, unusual weight change, abnormal bleeding, enlarged lymph nodes, angioedema, and breast masses.   Past Medical History  Diagnosis Date  . Hypertension   . Atrial fibrillation ([place])   . Anxiety   . Anemia    Past Surgical History  Procedure Laterality Date  . Appendectomy    . Abdominal hysterectomy     Social History:  reports that she has never smoked. She does not have any smokeless tobacco history on file. She reports that she does not drink alcohol. Her drug history is not on file.  Allergies  Allergen Reactions  . Norco [Hydrocodone-Acetaminophen] Itching  . Phenergan [Promethazine] Other (See Comments)    Mental disturbance  . Pravastatin Rash    Family History  Problem Relation Age of Onset  . Leukemia Mother   . Stroke Father   . CAD Brother     Prior to Admission medications   Medication Sig Start Date End Date Taking? Authorizing Provider  albuterol (PROVENTIL  HFA;VENTOLIN HFA) 108 (90 BASE) MCG/ACT inhaler Inhale 2 puffs into the lungs every 6 (six) hours as needed for wheezing or shortness of breath. 01/05/15   Victorino Dike, FNP  azithromycin (ZITHROMAX) 250 MG tablet Take 2 tablets today and 1 tablet for the next 4 days. 01/05/15   Victorino Dike, FNP  predniSONE (DELTASONE) 10 MG tablet Take 5 tablets (50 mg total) by mouth daily. 01/05/15   Victorino Dike, FNP   Physical Exam: Filed Vitals:   07/22/15 1152 07/22/15 1157 07/22/15 1230  BP:  116/49 130/57  Pulse:  70 53  Temp:  97.3 F (36.3 C)   TempSrc:  Oral   Resp:  96 17  Height:  5\' 1"  (1.549 m)   Weight:  48.081 kg (106 lb)   SpO2: 100%  100%     General: cachectic, [place]/AT, chronically ill appearing but in No apparent distress  Eyes: PERRL, EOMI, no scleral icterus, conjunctiva pale  ENT: moist oropharynx without exudate or lesions, TM's benign, dentition poor  Neck: supple, no lymphadenopathy. No bruits or thyromegaly  Cardiovascular: irregularly irregular without MRG; 2+ peripheral pulses, no JVD, no peripheral edema  Respiratory: CTA biL, good air movement without wheezing, rhonchi or crackled, respiratory effort normal  Abdomen: soft, non tender to palpation, positive bowel sounds, no guarding, no rebound, no organomegaly  Skin: no rashes or lesions  Musculoskeletal: normal bulk and tone, no joint swelling  Psychiatric: normal mood and affect, A&OX3  Neurologic: CN 2-12 grossly intact, Motor strength 5/5 in all 4 groups with non-focal sensory exam and symmetric DTR's  Labs on Admission:  Basic Metabolic Panel:  Recent Labs Lab 07/22/15 1155  NA 141  K 3.8  CL 114*  CO2 23  GLUCOSE 108*  BUN 15  CREATININE 0.84  CALCIUM 8.4*   Liver Function Tests: No results for input(s): AST, ALT, ALKPHOS, BILITOT, PROT, ALBUMIN in the last 168 hours. No results for input(s): LIPASE, AMYLASE in the last 168 hours. No results for input(s): AMMONIA in the last 168  hours. CBC:  Recent Labs Lab 07/22/15 1155  WBC 2.7*  NEUTROABS 1.0*  HGB 6.6*  HCT 22.4*  MCV 72.8*  PLT 189   Cardiac Enzymes:  Recent Labs Lab 07/22/15 1155  TROPONINI <0.03    BNP (last 3 results) No results for input(s): BNP in the last 8760 hours.  ProBNP (last 3 results) No results for input(s): PROBNP in the last 8760 hours.  CBG:  Recent Labs Lab 07/22/15 1159  GLUCAP 88    Radiological Exams on Admission: Ct Angio Chest/abd/pel For Dissection W And/or W/wo  07/22/2015  CLINICAL DATA:  Hypertension. Atrial fibrillation. Clinical suspicion for aortic dissection. EXAM: CT ANGIOGRAPHY CHEST, ABDOMEN AND PELVIS TECHNIQUE: Multidetector CT imaging through the chest, abdomen and pelvis was performed using the standard protocol during bolus administration of intravenous contrast. Multiplanar reconstructed images and MIPs were obtained and reviewed to evaluate the vascular anatomy. CONTRAST:  100 mL Isovue 370 COMPARISON:  AP CT on 01/10/2013 FINDINGS: CTA CHEST FINDINGS Mild aneurysm of ascending thoracic aorta seen measuring 4.1 cm in maximum diameter. Calcified atherosclerotic plaque noted, however there is no evidence of thoracic aortic dissection or mediastinal hematoma. Mild cardiomegaly is noted, without evidence of pericardial effusion. No lymphadenopathy identified within the thorax. No evidence pleural or pericardial effusion. Pulmonary arteries are well opacified and no pulmonary emboli visualized. Mild scarring seen in the right middle and lower lobes. No evidence of pulmonary infiltrate or pleural effusion. Review of the MIP images confirms the above findings. CTA ABDOMEN AND PELVIS FINDINGS Atherosclerotic plaque seen involving the abdominal aorta however there is no evidence of abdominal aortic aneurysm or dissection. No evidence of iliac artery aneurysm or dissection. The liver, gallbladder, spleen, pancreas, adrenal glands, and kidneys are normal in appearance.  No soft tissue masses or lymphadenopathy identified. Sigmoid diverticulosis is demonstrated, however there is no evidence of diverticulitis. No other inflammatory process or abnormal fluid collections identified. Lumbar spine degenerative changes noted with degenerative anterolisthesis seen at levels of L1-2 and L2-3. No suspicious lytic or sclerotic bone lesions identified. Review of the MIP images confirms the above findings. IMPRESSION: Mild aneurysm of ascending thoracic aorta measuring 4.1 cm. No evidence of aortic dissection or other acute findings. No evidence of abdominal aortic or iliac artery aneurysm or dissection. Colonic diverticulosis. No radiographic evidence of diverticulitis or other acute findings. Electronically Signed   By: Earle Gell M.D.   On: 07/22/2015 13:41    EKG: Independently reviewed.  Assessment/Plan Principal Problem:   GI bleed Active Problems:   Acute blood loss anemia   Weakness generalized   Chronic atrial fibrillation ([place])   Will admit to floor with telemetry and transfuse 2u PRBC's. Guaiac stools. Consult GI. Follow cardiac enzymes and consult Cardiology. Hold Eliquis. Continue 81mg  aspirin. Add IV protonix. Clear liquid diet. Repeat labs in AM.  Diet: clear liquid Fluids: NS@50  DVT Prophylaxis: none  Code Status: FULL  Family Communication: yes  Disposition Plan: home  Time spent: 55 min

## 2015-07-23 ENCOUNTER — Encounter: Payer: Self-pay | Admitting: *Deleted

## 2015-07-23 ENCOUNTER — Encounter
Admission: EM | Disposition: A | Discharge status: Home / Self Care | Payer: Self-pay | Source: Home / Self Care | Attending: Internal Medicine

## 2015-07-23 ENCOUNTER — Inpatient Hospital Stay: Payer: Commercial Managed Care - HMO | Admitting: Anesthesiology

## 2015-07-23 DIAGNOSIS — D509 Iron deficiency anemia, unspecified: Secondary | ICD-10-CM

## 2015-07-23 DIAGNOSIS — D62 Acute posthemorrhagic anemia: Secondary | ICD-10-CM

## 2015-07-23 DIAGNOSIS — D490 Neoplasm of unspecified behavior of digestive system: Secondary | ICD-10-CM

## 2015-07-23 DIAGNOSIS — E440 Moderate protein-calorie malnutrition: Secondary | ICD-10-CM | POA: Insufficient documentation

## 2015-07-23 DIAGNOSIS — D500 Iron deficiency anemia secondary to blood loss (chronic): Secondary | ICD-10-CM

## 2015-07-23 DIAGNOSIS — K922 Gastrointestinal hemorrhage, unspecified: Principal | ICD-10-CM

## 2015-07-23 HISTORY — PX: COLONOSCOPY WITH PROPOFOL: SHX5780

## 2015-07-23 HISTORY — PX: ESOPHAGOGASTRODUODENOSCOPY (EGD) WITH PROPOFOL: SHX5813

## 2015-07-23 LAB — COMPREHENSIVE METABOLIC PANEL
ALK PHOS: 60 U/L (ref 38–126)
ALT: 9 U/L — ABNORMAL LOW (ref 14–54)
ANION GAP: 6 (ref 5–15)
AST: 19 U/L (ref 15–41)
Albumin: 3.5 g/dL (ref 3.5–5.0)
BILIRUBIN TOTAL: 0.4 mg/dL (ref 0.3–1.2)
BUN: 10 mg/dL (ref 6–20)
CALCIUM: 8.4 mg/dL — AB (ref 8.9–10.3)
CO2: 24 mmol/L (ref 22–32)
CREATININE: 0.83 mg/dL (ref 0.44–1.00)
Chloride: 109 mmol/L (ref 101–111)
GFR calc non Af Amer: >60 mL/min (ref >60)
Glucose, Bld: 85 mg/dL (ref 65–99)
Potassium: 3.7 mmol/L (ref 3.5–5.1)
SODIUM: 139 mmol/L (ref 135–145)
TOTAL PROTEIN: 6.6 g/dL (ref 6.5–8.1)

## 2015-07-23 LAB — TYPE AND SCREEN
ABO/RH(D): O POS
ANTIBODY SCREEN: NEGATIVE
UNIT DIVISION: 0
UNIT DIVISION: 0

## 2015-07-23 LAB — CBC
HCT: 34 % — ABNORMAL LOW (ref 35.0–47.0)
Hemoglobin: 10.9 g/dL — ABNORMAL LOW (ref 12.0–16.0)
MCH: 24.4 pg — AB (ref 26.0–34.0)
MCHC: 32.1 g/dL (ref 32.0–36.0)
MCV: 75.9 fL — ABNORMAL LOW (ref 80.0–100.0)
PLATELETS: 208 10*3/uL (ref 150–440)
RBC: 4.48 MIL/uL (ref 3.80–5.20)
RDW: 21.8 % — ABNORMAL HIGH (ref 11.5–14.5)
WBC: 4.6 10*3/uL (ref 3.6–11.0)

## 2015-07-23 LAB — HEMOGLOBIN: HEMOGLOBIN: 10.5 g/dL — AB (ref 12.0–16.0)

## 2015-07-23 LAB — TROPONIN I: Troponin I: 0.4 ng/mL — ABNORMAL HIGH (ref <0.031)

## 2015-07-23 SURGERY — ESOPHAGOGASTRODUODENOSCOPY (EGD) WITH PROPOFOL
Anesthesia: General

## 2015-07-23 MED ORDER — BOOST / RESOURCE BREEZE PO LIQD
1.0000 | Freq: Three times a day (TID) | ORAL | Status: DC
  Administered 2015-07-23 – 2015-07-24 (×2): 1 via ORAL

## 2015-07-23 MED ORDER — ALUM & MAG HYDROXIDE-SIMETH 200-200-20 MG/5ML PO SUSP
15.0000 mL | Freq: Four times a day (QID) | ORAL | Status: DC | PRN
  Administered 2015-07-23: 15 mL via ORAL
  Filled 2015-07-23: qty 30

## 2015-07-23 MED ORDER — PROPOFOL 500 MG/50ML IV EMUL
INTRAVENOUS | Status: DC | PRN
  Administered 2015-07-23: 80 ug/kg/min via INTRAVENOUS

## 2015-07-23 MED ORDER — PROPOFOL 10 MG/ML IV BOLUS
INTRAVENOUS | Status: DC | PRN
  Administered 2015-07-23: 80 mg via INTRAVENOUS

## 2015-07-23 NOTE — Progress Notes (Signed)
Initial Nutrition Assessment  DOCUMENTATION CODES:   Non-severe (moderate) malnutrition in context of chronic illness  INTERVENTION:  Monitor intake and diet progression Recommend boost breeze TID for added nutrition   NUTRITION DIAGNOSIS:   Inadequate oral intake related to altered GI function as evidenced by per patient/family report.    GOAL:   Patient will meet greater than or equal to 90% of their needs    MONITOR:   PO intake, Diet advancement  REASON FOR ASSESSMENT:   Malnutrition Screening Tool    ASSESSMENT:      Past Medical History  Diagnosis Date  . Hypertension   . Atrial fibrillation ([place])   . Anxiety   . Anemia    Medications reviewed colace, protonix, NS 38ml/hr Labs reviewed, Hgb 10.9  Pt reports did not drink liquids this am as was drinking the prep solution and started throwing up. Reports poor po intake for the past 2 years. Typically has oatmeal or cereal for breakfast 1/2 sandwich for lunch and meat and some veggies for dinner.     Nutrition-Focused physical exam completed. Findings are mild/moderate fat depletion, moderate-severe  muscle depletion, and no edema.     Diet Order:  Diet NPO time specified  Skin:  Reviewed, no issues  Last BM:  4/24  Height:   Ht Readings from Last 1 Encounters:  07/22/15 5' (1.524 m)    Weight: 13% wt loss in the last 2 years  Wt Readings from Last 1 Encounters:  07/23/15 100 lb 8 oz (45.587 kg)    Ideal Body Weight:     BMI:  Body mass index is 19.63 kg/(m^2).  Estimated Nutritional Needs:   Kcal:  [id number] kcals/d.   Protein:  46-55 g/d  Fluid:  1.3-1.6 L/d  EDUCATION NEEDS:   No education needs identified at this time  Henryetta Corriveau B. Zenia Resides, [place], [place] (pager) Weekend/On-Call pager [phone])

## 2015-07-23 NOTE — Progress Notes (Signed)
   07/23/15 0930  Clinical Encounter Type  Visited With Patient and family together  Visit Type Follow-up  Referral From Nurse  Consult/Referral To Chaplain  Spiritual Encounters  Spiritual Needs Prayer  Stress Factors  Patient Stress Factors Health changes  Family Stress Factors Major life changes  Advance Directives (For Healthcare)  Does patient have an advance directive? No  Would patient like information on creating an advanced directive? Yes - Educational materials given  Met w/patient & daughter to follow up on AD education. Patient advised that she was uncertain if she wished to proceed. Reassured this was her decision and we were available to assist her as she desires. Provided prayer for pending surgery as she requested. Chap. Viyaan Champine G. [place]

## 2015-07-23 NOTE — Progress Notes (Signed)
PT Cancellation Note  Patient Details Name: Colleen Nguyen MRN: [id number] DOB: [birth year]   Cancelled Treatment:    Reason Eval/Treat Not Completed: Medical issues which prohibited therapy.  Pt's troponin up-trending to 0.40 today (07/23/15).  D/t this, will hold PT at this time and re-attempt PT eval at a later date/time as medically appropriate.   Leitha Bleak 07/23/2015, 8:37 AM Leitha Bleak, [place]

## 2015-07-23 NOTE — Anesthesia Postprocedure Evaluation (Signed)
Anesthesia Post Note  Patient: Colleen Nguyen  Procedure(s) Performed: Procedure(s) (LRB): ESOPHAGOGASTRODUODENOSCOPY (EGD) WITH PROPOFOL (N/A) COLONOSCOPY WITH PROPOFOL (N/A)  Patient location during evaluation: Endoscopy Anesthesia Type: General Level of consciousness: awake and alert Pain management: pain level controlled Vital Signs Assessment: post-procedure vital signs reviewed and stable Respiratory status: spontaneous breathing, nonlabored ventilation, respiratory function stable and patient connected to nasal cannula oxygen Cardiovascular status: blood pressure returned to baseline and stable Postop Assessment: no signs of nausea or vomiting Anesthetic complications: no    Last Vitals:  Filed Vitals:   07/23/15 1633 07/23/15 1652  BP: 124/51 139/52  Pulse: 48 59  Temp:  36.7 C  Resp: 21 17    Last Pain:  Filed Vitals:   07/23/15 1653  PainSc: [place]

## 2015-07-23 NOTE — Progress Notes (Signed)
Spoke to Dr. Allen Norris regarding GI procedure. MD to place orders. Patient to remain NPO. Colleen Nguyen

## 2015-07-23 NOTE — Anesthesia Preprocedure Evaluation (Addendum)
Anesthesia Evaluation  Patient identified by MRN, date of birth, ID band Patient awake    Reviewed: Allergy & Precautions, NPO status , Patient's Chart, lab work & pertinent test results, reviewed documented beta blocker date and time   Airway Mallampati: II  TM Distance: >3 FB     Dental  (+) Chipped   Pulmonary           Cardiovascular hypertension, Pt. on medications and Pt. on home beta blockers      Neuro/Psych Anxiety    GI/Hepatic   Endo/Other    Renal/GU      Musculoskeletal   Abdominal   Peds  Hematology  (+) anemia ,   Anesthesia Other Findings Hx of bradycardia. A fib. Has cardiology clearance.Takes eliquis. Anemic 10.9hb.   Reproductive/Obstetrics                           Anesthesia Physical Anesthesia Plan  ASA: III  Anesthesia Plan: General   Post-op Pain Management:    Induction:   Airway Management Planned: Nasal Cannula  Additional Equipment:   Intra-op Plan:   Post-operative Plan:   Informed Consent: I have reviewed the patients History and Physical, chart, labs and discussed the procedure including the risks, benefits and alternatives for the proposed anesthesia with the patient or authorized representative who has indicated his/her understanding and acceptance.     Plan Discussed with: CRNA  Anesthesia Plan Comments:         Anesthesia Quick Evaluation

## 2015-07-23 NOTE — Op Note (Signed)
[facility]enter[facility] Patient Name: Colleen Nguyen Procedure Date: 07/23/2015 3:31 PM MRN: [id number] Account #: [id number] Date of Birth: 1922-08-30 Admit Type: Inpatient Age: 82 Room: [facility] ENDO ROOM 4 Gender: Female Note Status: Finalized Procedure:            Upper GI endoscopy Indications:          Iron deficiency anemia Providers:            Lucilla Lame, MD Referring MD:         Sofie Hartigan (Referring MD) Medicines:            Propofol per Anesthesia Complications:        No immediate complications. Procedure:            Pre-Anesthesia Assessment:                       - Prior to the procedure, a History and Physical was                        performed, and patient medications and allergies were                        reviewed. The patient's tolerance of previous                        anesthesia was also reviewed. The risks and benefits of                        the procedure and the sedation options and risks were                        discussed with the patient. All questions were                        answered, and informed consent was obtained. Prior                        Anticoagulants: The patient has taken no previous                        anticoagulant or antiplatelet agents. ASA Grade                        Assessment: II - A patient with mild systemic disease.                        After reviewing the risks and benefits, the patient was                        deemed in satisfactory condition to undergo the                        procedure.                       After obtaining informed consent, the endoscope was                        passed under direct vision. Throughout the procedure,  the patient's blood pressure, pulse, and oxygen                        saturations were monitored continuously. The Endoscope                        was introduced through the mouth, and advanced to the       second part of duodenum. The upper GI endoscopy was                        accomplished without difficulty. The patient tolerated                        the procedure well. Findings:      One moderate benign-appearing, intrinsic stenosis was found at the       gastroesophageal junction. And was traversed.      Diffuse moderate inflammation characterized by granularity was found in       the entire examined stomach.      The examined duodenum was normal. Impression:           - Benign-appearing esophageal stenosis.                       - Gastritis.                       - Normal examined duodenum.                       - No specimens collected. Recommendation:       - Perform a colonoscopy today. Procedure Code(s):    --- Professional ---                       [phone], Esophagogastroduodenoscopy, flexible, transoral;                        diagnostic, including collection of specimen(s) by                        brushing or washing, when performed (separate procedure) Diagnosis Code(s):    --- Professional ---                       D50.9, Iron deficiency anemia, unspecified                       K29.70, Gastritis, unspecified, without bleeding                       K22.2, Esophageal obstruction CPT copyright 2016 [employer]. All rights reserved. The codes documented in this report are preliminary and upon coder review may  be revised to meet current compliance requirements. Lucilla Lame, MD 07/23/2015 3:42:51 PM This report has been signed electronically. Number of Addenda: 0 Note Initiated On: 07/23/2015 3:31 PM      [facility]

## 2015-07-23 NOTE — Progress Notes (Signed)
Pt is at low risk for egd/colonscopy with propofol sedation

## 2015-07-23 NOTE — Progress Notes (Signed)
[place] [facility] [place] NAME: Colleen Nguyen    MR#:  [id number]  DATE OF BIRTH:  [birth year]  SUBJECTIVE:   Patient denies abdominal pain, dizziness or shortness of breath. No dark-colored stools or bloody stools.  REVIEW OF SYSTEMS:    Review of Systems  Constitutional: Negative for fever, chills and malaise/fatigue.  HENT: Negative for ear discharge, ear pain, hearing loss, nosebleeds and sore throat.   Eyes: Negative for blurred vision and pain.  Respiratory: Negative for cough, hemoptysis, shortness of breath and wheezing.   Cardiovascular: Negative for chest pain, palpitations and leg swelling.  Gastrointestinal: Negative for nausea, vomiting, abdominal pain, diarrhea and blood in stool.  Genitourinary: Negative for dysuria.  Musculoskeletal: Negative for back pain.  Neurological: Negative for dizziness, tremors, speech change, focal weakness, seizures and headaches.  Endo/Heme/Allergies: Does not bruise/bleed easily.  Psychiatric/Behavioral: Negative for depression, suicidal ideas and hallucinations.    Tolerating Diet: Nothing by mouth      DRUG ALLERGIES:   Allergies  Allergen Reactions  . Carisoprodol     Other reaction(s): Other (See Comments) Hypersommulence requiring EMS eval.  . Norco [Hydrocodone-Acetaminophen] Itching  . Phenergan [Promethazine] Other (See Comments)    Mental disturbance  . Pravastatin Rash  . Tizanidine Rash    VITALS:  Blood pressure 129/50, pulse 53, temperature 98.3 F (36.8 C), temperature source Oral, resp. rate 20, height 5' (1.524 m), weight 45.587 kg (100 lb 8 oz), SpO2 98 %.  PHYSICAL EXAMINATION:   Physical Exam  Constitutional: She is oriented to person, place, and time and well-developed, well-nourished, and in no distress. No distress.  HENT:  Head: Normocephalic.  Eyes: No scleral icterus.  Neck: Normal range of motion. Neck supple. No JVD present. No tracheal deviation  present.  Cardiovascular: Normal rate, regular rhythm and normal heart sounds.  Exam reveals no gallop and no friction rub.   No murmur heard. Pulmonary/Chest: Effort normal and breath sounds normal. No respiratory distress. She has no wheezes. She has no rales. She exhibits no tenderness.  Abdominal: Soft. Bowel sounds are normal. She exhibits no distension and no mass. There is no tenderness. There is no rebound and no guarding.  Musculoskeletal: Normal range of motion. She exhibits no edema.  Neurological: She is alert and oriented to person, place, and time.  Skin: Skin is warm. No rash noted. No erythema.  Psychiatric: Affect and judgment normal.      LABORATORY PANEL:   CBC  Recent Labs Lab 07/23/15 0321  WBC 4.6  HGB 10.9*  HCT 34.0*  PLT 208   ------------------------------------------------------------------------------------------------------------------  Chemistries   Recent Labs Lab 07/23/15 0321  NA 139  K 3.7  CL 109  CO2 24  GLUCOSE 85  BUN 10  CREATININE 0.83  CALCIUM 8.4*  AST 19  ALT 9*  ALKPHOS 60  BILITOT 0.4   ------------------------------------------------------------------------------------------------------------------  Cardiac Enzymes  Recent Labs Lab 07/22/15 1155 07/22/15 2112 07/23/15 0321  TROPONINI <0.03 0.09* 0.40*   ------------------------------------------------------------------------------------------------------------------  RADIOLOGY:  Ct Angio Chest/abd/pel For Dissection W And/or W/wo  07/22/2015  CLINICAL DATA:  Hypertension. Atrial fibrillation. Clinical suspicion for aortic dissection. EXAM: CT ANGIOGRAPHY CHEST, ABDOMEN AND PELVIS TECHNIQUE: Multidetector CT imaging through the chest, abdomen and pelvis was performed using the standard protocol during bolus administration of intravenous contrast. Multiplanar reconstructed images and MIPs were obtained and reviewed to evaluate the vascular anatomy. CONTRAST:  100  mL Isovue 370 COMPARISON:  AP CT on 01/10/2013 FINDINGS:  CTA CHEST FINDINGS Mild aneurysm of ascending thoracic aorta seen measuring 4.1 cm in maximum diameter. Calcified atherosclerotic plaque noted, however there is no evidence of thoracic aortic dissection or mediastinal hematoma. Mild cardiomegaly is noted, without evidence of pericardial effusion. No lymphadenopathy identified within the thorax. No evidence pleural or pericardial effusion. Pulmonary arteries are well opacified and no pulmonary emboli visualized. Mild scarring seen in the right middle and lower lobes. No evidence of pulmonary infiltrate or pleural effusion. Review of the MIP images confirms the above findings. CTA ABDOMEN AND PELVIS FINDINGS Atherosclerotic plaque seen involving the abdominal aorta however there is no evidence of abdominal aortic aneurysm or dissection. No evidence of iliac artery aneurysm or dissection. The liver, gallbladder, spleen, pancreas, adrenal glands, and kidneys are normal in appearance. No soft tissue masses or lymphadenopathy identified. Sigmoid diverticulosis is demonstrated, however there is no evidence of diverticulitis. No other inflammatory process or abnormal fluid collections identified. Lumbar spine degenerative changes noted with degenerative anterolisthesis seen at levels of L1-2 and L2-3. No suspicious lytic or sclerotic bone lesions identified. Review of the MIP images confirms the above findings. IMPRESSION: Mild aneurysm of ascending thoracic aorta measuring 4.1 cm. No evidence of aortic dissection or other acute findings. No evidence of abdominal aortic or iliac artery aneurysm or dissection. Colonic diverticulosis. No radiographic evidence of diverticulitis or other acute findings. Electronically Signed   By: Earle Gell M.D.   On: 07/22/2015 13:41     ASSESSMENT AND PLAN:   82 year old female with a history of essential hypertension and chronic atrial fibrillation on anticoagulation who  presented with symptomatic anemia.  1. Acute blood loss anemia: Patient is status post 2 unit PRBC. Discontinue apixaban as per cardiology consult. Plan for EGD and colonoscopy today. Hemoglobin stable at this point. Continue PPI Avoid NSAIDs  2. Chronic atrial fibrillation: Continue diltiazem and metoprolol for heart rate control. Discontinue anticoagulation due to problem #1.  3. Essential hypertension: Blood pressures except oh. Continue diltiazem, lisinopril and metoprolol.   4. Protein calorie malnutrition: Continue boost supplement   5. Elevated troponin: Due to demand ischemia from significant anemia.   Management plans discussed with the patient and daughter and she is in agreement.  CODE STATUS: FULL  TOTAL TIME TAKING CARE OF THIS PATIENT: 30 minutes.     POSSIBLE D/C 1 day, DEPENDING ON CLINICAL CONDITION.   Harris Penton M.D on 07/23/2015 at 11:41 AM  Between 7am to 6pm - Pager - [phone] After 6pm go to [email or web address] - password [email or web address] [facility]  Office  [phone]  CC: Primary care physician; [facility], Chrissie Noa, MD  Note: This dictation was prepared with Dragon dictation along with smaller phrase technology. Any transcriptional errors that result from this process are unintentional.

## 2015-07-23 NOTE — Transfer of Care (Signed)
Immediate Anesthesia Transfer of Care Note  Patient: Colleen Nguyen  Procedure(s) Performed: Procedure(s): ESOPHAGOGASTRODUODENOSCOPY (EGD) WITH PROPOFOL (N/A) COLONOSCOPY WITH PROPOFOL (N/A)  Patient Location: PACU and Endoscopy Unit  Anesthesia Type:General  Level of Consciousness: sedated and responds to stimulation  Airway & Oxygen Therapy: Patient Spontanous Breathing and Patient connected to nasal cannula oxygen  Post-op Assessment: Report given to RN and Post -op Vital signs reviewed and stable  Post vital signs: Reviewed and stable  Last Vitals:  Filed Vitals:   07/23/15 1603 07/23/15 1605  BP: 108/57 107/58  Pulse: 59 71  Temp: 36.6 C   Resp: 14 14    Complications: No apparent anesthesia complications

## 2015-07-23 NOTE — Op Note (Signed)
[facility] Gastroenterology Patient Name: Colleen Nguyen Procedure Date: 07/23/2015 3:29 PM MRN: [id number] Account #: [id number] Date of Birth: 1922-09-02 Admit Type: Inpatient Age: 82 Room: [facility] ENDO ROOM 4 Gender: Female Note Status: Finalized Procedure:            Colonoscopy Indications:          Iron deficiency anemia Providers:            Lucilla Lame, MD Referring MD:         Sofie Hartigan (Referring MD) Medicines:            Propofol per Anesthesia Complications:        No immediate complications. Procedure:            Pre-Anesthesia Assessment:                       - Prior to the procedure, a History and Physical was                        performed, and patient medications and allergies were                        reviewed. The patient's tolerance of previous                        anesthesia was also reviewed. The risks and benefits of                        the procedure and the sedation options and risks were                        discussed with the patient. All questions were                        answered, and informed consent was obtained. Prior                        Anticoagulants: The patient has taken Eliquis                        (apixaban), last dose was 2 days prior to procedure.                        ASA Grade Assessment: III - A patient with severe                        systemic disease. After reviewing the risks and                        benefits, the patient was deemed in satisfactory                        condition to undergo the procedure.                       After obtaining informed consent, the colonoscope was                        passed under direct vision. Throughout the procedure,  the patient's blood pressure, pulse, and oxygen                        saturations were monitored continuously. The                        Colonoscope was introduced through the anus and   advanced to the the cecum, identified by appendiceal                        orifice and ileocecal valve. The colonoscopy was                        performed without difficulty. The patient tolerated the                        procedure well. The quality of the bowel preparation                        was excellent. Findings:      The perianal and digital rectal examinations were normal.      A few small-mouthed diverticula were found in the sigmoid colon.      A polypoid non-obstructing medium-sized mass was found in the cecum. The       mass was non-circumferential. In addition, its diameter measured fifteen       mm. No bleeding was present. No biopsies or other specimens were       collected for this exam. Impression:           - Diverticulosis in the sigmoid colon.                       - Ulcerated tumor in the cecum. No specimens collected. Recommendation:       - Return patient to hospital ward for ongoing care. Procedure Code(s):    --- Professional ---                       [phone], Colonoscopy, flexible; diagnostic, including                        collection of specimen(s) by brushing or washing, when                        performed (separate procedure) Diagnosis Code(s):    --- Professional ---                       D50.9, Iron deficiency anemia, unspecified                       D49.0, Neoplasm of unspecified behavior of digestive                        system                       K57.30, Diverticulosis of large intestine without                        perforation or abscess without bleeding CPT copyright 2016 [employer]. All rights reserved. The codes documented in this report are preliminary and upon coder review may  be revised to meet current compliance requirements. Lucilla Lame, MD 07/23/2015 3:59:18 PM This report has been signed electronically. Number of Addenda: 0 Note Initiated On: 07/23/2015 3:29 PM Scope Withdrawal Time: 0 hours 4 minutes 38  seconds  Total Procedure Duration: 0 hours 11 minutes 48 seconds       [facility]

## 2015-07-23 NOTE — Care Management (Signed)
Presented to [facility] with the diagnosis of GI Bleed. Daughter is [name] [phone]) which she lives with. Last seen Dr, Ellison Hughs in March. No Home Health. [place] 5-6 years ago. Shower chair and bedside commode in the home. . No falls. Good appetite. Takes care of all basic activities of daily living herself, just needs help getting in and out of shower,  Shelbie Ammons RN MSN CCM Care Management [phone]

## 2015-07-23 NOTE — Consult Note (Signed)
[name]  CARDIOLOGY CONSULT NOTE  Patient ID: Colleen Nguyen MRN: [id number] DOB/AGE: 1922/04/28 82 y.o.  Admit date: 07/22/2015 Referring Physician Dr. Doy Hutching Primary Physician Dr. Ellison Hughs Primary Cardiologist Dr. Nehemiah Massed Reason for Consultation afib with gi bleed  HPI: Patient is a 82 year old female with history of atrial fibrillation chronically treated with anticoagulation with apixaban at 2.5 mg twice daily and rate control with diltiazem at 180 mg daily. She is also metoprolol metoprolol tartrate 25 mg twice daily. She presented with complaints of weakness and dark stools. She also was extremely anemic with a hemoglobin of 6.6. Serum troponin was 0.09 on presentation and 0.4 subsequently. She denies chest pain but complains of weakness and fatigue. She was taken off of apixaban and transfused. Current hemoglobin has improved to 10.9. Telemetry reveals sinus rhythm. Her chads score is 4. She denies any current chest pain.  Review of Systems  Constitutional: Positive for malaise/fatigue.  HENT: Negative.   Eyes: Negative.   Respiratory: Negative.   Cardiovascular: Negative.   Gastrointestinal: Positive for blood in stool.  Genitourinary: Negative.   Musculoskeletal: Negative.   Skin: Negative.   Neurological: Positive for weakness.  Endo/Heme/Allergies: Negative.   Psychiatric/Behavioral: Negative.     Past Medical History  Diagnosis Date  . Hypertension   . Atrial fibrillation ([place])   . Anxiety   . Anemia     Family History  Problem Relation Age of Onset  . Leukemia Mother   . Stroke Father   . CAD Brother     Social History   Social History  . Marital Status: Widowed    Spouse Name: N/A  . Number of Children: N/A  . Years of Education: N/A   Occupational History  . Not on file.   Social History Main Topics  . Smoking status: Never Smoker   . Smokeless tobacco: Not on file  . Alcohol Use: No  . Drug  Use: Not on file  . Sexual Activity: Not on file   Other Topics Concern  . Not on file   Social History Narrative    Past Surgical History  Procedure Laterality Date  . Appendectomy    . Abdominal hysterectomy       Prescriptions prior to admission  Medication Sig Dispense Refill Last Dose  . albuterol (PROVENTIL HFA;VENTOLIN HFA) 108 (90 BASE) MCG/ACT inhaler Inhale 2 puffs into the lungs every 6 (six) hours as needed for wheezing or shortness of breath. 1 Inhaler 2 Past Month at Unknown time  . ALPRAZolam (XANAX) 0.5 MG tablet Take 1 tablet by mouth 2 (two) times daily as needed. For anxiety/sleep.   07/22/2015 at Unknown time  . apixaban (ELIQUIS) 2.5 MG TABS tablet Take 2.5 mg by mouth 2 (two) times daily.   07/21/2015 at Unknown time  . diltiazem (CARDIZEM CD) 180 MG 24 hr capsule Take 180 mg by mouth daily.   07/21/2015 at Unknown time  . HYDROcodone-acetaminophen (NORCO/VICODIN) 5-325 MG tablet Take 1-2 tablets by mouth See admin instructions. Take 1 to 2 tablets by mouth every 4 to 6 hours as needed for pain.   07/22/2015 at Unknown time  . lisinopril (PRINIVIL,ZESTRIL) 10 MG tablet Take 20 mg by mouth daily.   07/21/2015 at Unknown time  . metoprolol tartrate (LOPRESSOR) 25 MG tablet Take 25 mg by mouth 2 (two) times daily as needed. For heart rate.   07/08/2015  . omeprazole (PRILOSEC) 20 MG capsule Take 20 mg by mouth daily.  07/21/2015 at Unknown time  . tiZANidine (ZANAFLEX) 4 MG tablet Take 4 mg by mouth every 8 (eight) hours as needed. For muscle spasm.   07/22/2015 at Unknown time    Physical Exam: Blood pressure 129/50, pulse 53, temperature 98.3 F (36.8 C), temperature source Oral, resp. rate 20, height 5' (1.524 m), weight 45.587 kg (100 lb 8 oz), SpO2 98 %.   Wt Readings from Last 1 Encounters:  07/23/15 45.587 kg (100 lb 8 oz)     General appearance: alert and cooperative Resp: clear to auscultation bilaterally Cardio: regular rate and rhythm GI: normal findings:  bowel sounds normal Extremities: extremities normal, atraumatic, no cyanosis or edema Neurologic: Grossly normal  Labs:   Lab Results  Component Value Date   WBC 4.6 07/23/2015   HGB 10.9* 07/23/2015   HCT 34.0* 07/23/2015   MCV 75.9* 07/23/2015   PLT 208 07/23/2015    Recent Labs Lab 07/23/15 0321  NA 139  K 3.7  CL 109  CO2 24  BUN 10  CREATININE 0.83  CALCIUM 8.4*  PROT 6.6  BILITOT 0.4  ALKPHOS 60  ALT 9*  AST 19  GLUCOSE 85   Lab Results  Component Value Date   CKTOTAL 44 04/26/2012   CKMB 0.5 04/26/2012   TROPONINI 0.40* 07/23/2015      Radiology: Chest CT revealed a mild aneurysm of descending thoracic aorta measuring 4.1 cm. No evidence of dissection. No evidence of abdominal or iliac artery aneurysm. She had chronic diverticulosis EKG: Sinus rhythm with no ischemic changes   ASSESSMENT AND PLAN:  Patient is 47-year-old female with history of atrial fibrillation with a chads score of 4 with again on apixaban for anticoagulation at 2.5 mg twice daily. She presented to the emergency room with complaints of weakness and dizziness hypertension and dark stools. She was noted to be hypotensive and severely anemic with hemoglobin of less than 7. She was taken off of apixaban and transfuse. She has improved hemodynamically. Remain off of apixaban and discontinue aspirin for now. Would continue following rate control with diltiazem and metoprolol as heart rate and blood pressure tolerate. We'll add back lisinopril as pressure tolerates. Patient is a ascending aneurysm is benign at present would continue to follow. Further workup of the etiology of her GI bleed per GI. Signed: Teodoro Spray MD, [facility] 07/23/2015, 7:23 AM

## 2015-07-23 NOTE — Progress Notes (Signed)
Consent signed, EGD and Colonoscopy to be performed by Dr. Allen Norris. Madlyn Frankel, RN

## 2015-07-24 ENCOUNTER — Observation Stay
Admission: EM | Admit: 2015-07-24 | Discharge: 2015-07-25 | Disposition: A | Discharge status: Home / Self Care | Payer: Commercial Managed Care - HMO | Attending: Internal Medicine | Admitting: Internal Medicine

## 2015-07-24 ENCOUNTER — Encounter: Payer: Self-pay | Admitting: *Deleted

## 2015-07-24 DIAGNOSIS — Z86010 Personal history of colonic polyps: Secondary | ICD-10-CM | POA: Insufficient documentation

## 2015-07-24 DIAGNOSIS — I248 Other forms of acute ischemic heart disease: Secondary | ICD-10-CM | POA: Diagnosis not present

## 2015-07-24 DIAGNOSIS — R7989 Other specified abnormal findings of blood chemistry: Secondary | ICD-10-CM | POA: Diagnosis not present

## 2015-07-24 DIAGNOSIS — I10 Essential (primary) hypertension: Secondary | ICD-10-CM | POA: Diagnosis not present

## 2015-07-24 DIAGNOSIS — I2510 Atherosclerotic heart disease of native coronary artery without angina pectoris: Secondary | ICD-10-CM | POA: Insufficient documentation

## 2015-07-24 DIAGNOSIS — R778 Other specified abnormalities of plasma proteins: Secondary | ICD-10-CM | POA: Diagnosis not present

## 2015-07-24 DIAGNOSIS — I959 Hypotension, unspecified: Secondary | ICD-10-CM | POA: Diagnosis not present

## 2015-07-24 DIAGNOSIS — R262 Difficulty in walking, not elsewhere classified: Secondary | ICD-10-CM | POA: Insufficient documentation

## 2015-07-24 DIAGNOSIS — Z681 Body mass index (BMI) 19 or less, adult: Secondary | ICD-10-CM | POA: Insufficient documentation

## 2015-07-24 DIAGNOSIS — Z888 Allergy status to other drugs, medicaments and biological substances status: Secondary | ICD-10-CM | POA: Insufficient documentation

## 2015-07-24 DIAGNOSIS — E440 Moderate protein-calorie malnutrition: Secondary | ICD-10-CM | POA: Insufficient documentation

## 2015-07-24 DIAGNOSIS — I4581 Long QT syndrome: Secondary | ICD-10-CM | POA: Insufficient documentation

## 2015-07-24 DIAGNOSIS — I712 Thoracic aortic aneurysm, without rupture: Secondary | ICD-10-CM | POA: Insufficient documentation

## 2015-07-24 DIAGNOSIS — R001 Bradycardia, unspecified: Principal | ICD-10-CM | POA: Insufficient documentation

## 2015-07-24 DIAGNOSIS — I952 Hypotension due to drugs: Secondary | ICD-10-CM | POA: Insufficient documentation

## 2015-07-24 DIAGNOSIS — K5730 Diverticulosis of large intestine without perforation or abscess without bleeding: Secondary | ICD-10-CM | POA: Insufficient documentation

## 2015-07-24 DIAGNOSIS — Z885 Allergy status to narcotic agent status: Secondary | ICD-10-CM | POA: Insufficient documentation

## 2015-07-24 DIAGNOSIS — N170 Acute kidney failure with tubular necrosis: Secondary | ICD-10-CM | POA: Insufficient documentation

## 2015-07-24 DIAGNOSIS — I482 Chronic atrial fibrillation: Secondary | ICD-10-CM | POA: Insufficient documentation

## 2015-07-24 DIAGNOSIS — D509 Iron deficiency anemia, unspecified: Secondary | ICD-10-CM | POA: Insufficient documentation

## 2015-07-24 DIAGNOSIS — F419 Anxiety disorder, unspecified: Secondary | ICD-10-CM | POA: Insufficient documentation

## 2015-07-24 DIAGNOSIS — D490 Neoplasm of unspecified behavior of digestive system: Secondary | ICD-10-CM | POA: Insufficient documentation

## 2015-07-24 DIAGNOSIS — T461X5A Adverse effect of calcium-channel blockers, initial encounter: Secondary | ICD-10-CM | POA: Insufficient documentation

## 2015-07-24 DIAGNOSIS — R531 Weakness: Secondary | ICD-10-CM | POA: Insufficient documentation

## 2015-07-24 DIAGNOSIS — K922 Gastrointestinal hemorrhage, unspecified: Secondary | ICD-10-CM | POA: Diagnosis not present

## 2015-07-24 DIAGNOSIS — Z8249 Family history of ischemic heart disease and other diseases of the circulatory system: Secondary | ICD-10-CM | POA: Insufficient documentation

## 2015-07-24 LAB — BASIC METABOLIC PANEL
ANION GAP: 8 (ref 5–15)
BUN: 16 mg/dL (ref 6–20)
CHLORIDE: 110 mmol/L (ref 101–111)
CO2: 21 mmol/L — ABNORMAL LOW (ref 22–32)
Calcium: 8.6 mg/dL — ABNORMAL LOW (ref 8.9–10.3)
Creatinine, Ser: 1.11 mg/dL — ABNORMAL HIGH (ref 0.44–1.00)
GFR, EST AFRICAN AMERICAN: 49 mL/min — AB (ref >60)
GFR, EST NON AFRICAN AMERICAN: 42 mL/min — AB (ref >60)
Glucose, Bld: 87 mg/dL (ref 65–99)
POTASSIUM: 4 mmol/L (ref 3.5–5.1)
SODIUM: 139 mmol/L (ref 135–145)

## 2015-07-24 LAB — CBC WITH DIFFERENTIAL/PLATELET
BASOS ABS: 0 10*3/uL (ref 0–0.1)
Basophils Relative: 0 %
EOS ABS: 0.1 10*3/uL (ref 0–0.7)
Eosinophils Relative: 1 %
HCT: 35.1 % (ref 35.0–47.0)
HEMOGLOBIN: 11 g/dL — AB (ref 12.0–16.0)
LYMPHS ABS: 1.5 10*3/uL (ref 1.0–3.6)
Lymphocytes Relative: 29 %
MCH: 24.5 pg — AB (ref 26.0–34.0)
MCHC: 31.3 g/dL — ABNORMAL LOW (ref 32.0–36.0)
MCV: 78.3 fL — ABNORMAL LOW (ref 80.0–100.0)
MONOS PCT: 14 %
Monocytes Absolute: 0.7 10*3/uL (ref 0.2–0.9)
NEUTROS PCT: 56 %
Neutro Abs: 2.8 10*3/uL (ref 1.4–6.5)
Platelets: 181 10*3/uL (ref 150–440)
RBC: 4.49 MIL/uL (ref 3.80–5.20)
RDW: 22.1 % — ABNORMAL HIGH (ref 11.5–14.5)
WBC: 5.1 10*3/uL (ref 3.6–11.0)

## 2015-07-24 LAB — URINALYSIS COMPLETE WITH MICROSCOPIC (ARMC ONLY)
BILIRUBIN URINE: NEGATIVE
Bacteria, UA: NONE SEEN
GLUCOSE, UA: NEGATIVE mg/dL
Hgb urine dipstick: NEGATIVE
LEUKOCYTES UA: NEGATIVE
NITRITE: NEGATIVE
Protein, ur: NEGATIVE mg/dL
SPECIFIC GRAVITY, URINE: 1.015 (ref 1.005–1.030)
pH: 6 (ref 5.0–8.0)

## 2015-07-24 LAB — CBC
HCT: 32.9 % — ABNORMAL LOW (ref 35.0–47.0)
Hemoglobin: 10.5 g/dL — ABNORMAL LOW (ref 12.0–16.0)
MCH: 24.8 pg — ABNORMAL LOW (ref 26.0–34.0)
MCHC: 31.8 g/dL — AB (ref 32.0–36.0)
MCV: 78 fL — ABNORMAL LOW (ref 80.0–100.0)
Platelets: 180 10*3/uL (ref 150–440)
RBC: 4.22 MIL/uL (ref 3.80–5.20)
RDW: 22 % — AB (ref 11.5–14.5)
WBC: 4.4 10*3/uL (ref 3.6–11.0)

## 2015-07-24 LAB — TROPONIN I
TROPONIN I: 0.52 ng/mL — AB (ref <0.031)
Troponin I: 0.38 ng/mL — ABNORMAL HIGH (ref <0.031)

## 2015-07-24 MED ORDER — DILTIAZEM HCL 25 MG/5ML IV SOLN
10.0000 mg | Freq: Once | INTRAVENOUS | Status: AC
  Administered 2015-07-24: 10 mg via INTRAVENOUS
  Filled 2015-07-24: qty 5

## 2015-07-24 MED ORDER — BOOST / RESOURCE BREEZE PO LIQD: 1.0000 | Freq: Three times a day (TID) | ORAL | Status: DC

## 2015-07-24 MED ORDER — ONDANSETRON HCL 4 MG/2ML IJ SOLN: 4.0000 mg | Freq: Four times a day (QID) | INTRAMUSCULAR | Status: DC | PRN

## 2015-07-24 MED ORDER — DILTIAZEM LOAD VIA INFUSION: 10.0000 mg | Freq: Once | INTRAVENOUS | Status: DC

## 2015-07-24 MED ORDER — ALPRAZOLAM 0.5 MG PO TABS: 0.5000 mg | ORAL_TABLET | Freq: Two times a day (BID) | ORAL | Status: DC | PRN

## 2015-07-24 MED ORDER — DILTIAZEM HCL 30 MG PO TABS
30.0000 mg | ORAL_TABLET | Freq: Four times a day (QID) | ORAL | Status: DC
Start: 2015-07-24 — End: 2015-07-25
  Filled 2015-07-24: qty 1

## 2015-07-24 MED ORDER — ALPRAZOLAM 0.25 MG PO TABS: 0.5000 mg | ORAL_TABLET | Freq: Two times a day (BID) | ORAL | Status: DC | PRN

## 2015-07-24 MED ORDER — HYDROCODONE-ACETAMINOPHEN 5-325 MG PO TABS
1.0000 | ORAL_TABLET | ORAL | Status: DC | PRN
  Administered 2015-07-24 – 2015-07-25 (×2): 1 via ORAL
  Filled 2015-07-24 (×2): qty 1

## 2015-07-24 MED ORDER — BISACODYL 5 MG PO TBEC
5.0000 mg | DELAYED_RELEASE_TABLET | Freq: Every day | ORAL | Status: DC | PRN
Start: 2015-07-24 — End: 2015-07-25

## 2015-07-24 MED ORDER — DILTIAZEM HCL ER COATED BEADS 180 MG PO CP24: 300.0000 mg | ORAL_CAPSULE | Freq: Every day | ORAL | Status: DC

## 2015-07-24 MED ORDER — TRAZODONE HCL 50 MG PO TABS: 25.0000 mg | ORAL_TABLET | Freq: Every evening | ORAL | Status: DC | PRN

## 2015-07-24 MED ORDER — DILTIAZEM HCL ER COATED BEADS 240 MG PO CP24: 240.0000 mg | ORAL_CAPSULE | Freq: Every day | ORAL | Status: DC

## 2015-07-24 MED ORDER — DILTIAZEM HCL 25 MG/5ML IV SOLN
10.0000 mg | Freq: Once | INTRAVENOUS | Status: AC
  Administered 2015-07-24: 10:00:00 10 mg via INTRAVENOUS
  Filled 2015-07-24: qty 5

## 2015-07-24 MED ORDER — SODIUM CHLORIDE 0.9 % IV BOLUS (SEPSIS)
500.0000 mL | Freq: Once | INTRAVENOUS | Status: AC
  Administered 2015-07-24: 500 mL via INTRAVENOUS

## 2015-07-24 MED ORDER — ALBUTEROL SULFATE (2.5 MG/3ML) 0.083% IN NEBU: 2.5000 mg | INHALATION_SOLUTION | Freq: Four times a day (QID) | RESPIRATORY_TRACT | Status: DC | PRN

## 2015-07-24 MED ORDER — HEPARIN SODIUM (PORCINE) 5000 UNIT/ML IJ SOLN
5000.0000 [IU] | Freq: Three times a day (TID) | INTRAMUSCULAR | Status: DC
  Administered 2015-07-24 – 2015-07-25 (×2): 5000 [IU] via SUBCUTANEOUS
  Filled 2015-07-24 (×2): qty 1

## 2015-07-24 MED ORDER — DOCUSATE SODIUM 100 MG PO CAPS
100.0000 mg | ORAL_CAPSULE | Freq: Two times a day (BID) | ORAL | Status: DC
  Administered 2015-07-24 – 2015-07-25 (×2): 100 mg via ORAL
  Filled 2015-07-24 (×2): qty 1

## 2015-07-24 MED ORDER — PANTOPRAZOLE SODIUM 40 MG PO TBEC
40.0000 mg | DELAYED_RELEASE_TABLET | Freq: Every day | ORAL | Status: DC
  Administered 2015-07-25: 40 mg via ORAL
  Filled 2015-07-24: qty 1

## 2015-07-24 MED ORDER — ONDANSETRON HCL 4 MG PO TABS: 4.0000 mg | ORAL_TABLET | Freq: Four times a day (QID) | ORAL | Status: DC | PRN

## 2015-07-24 NOTE — Care Management (Signed)
Discharge to Home today per Dr .Benjie Karvonen. Discussed Home Health services in the home. [place]. Floydene Flock, [facility] representative updated. Family will transport. Shelbie Ammons RN MSN CCM Care Management [phone]

## 2015-07-24 NOTE — H&P (Signed)
[facility] NAME: Colleen Nguyen    MR#:  [id number]  DATE OF BIRTH:  07/03/1922  DATE OF ADMISSION:  07/24/2015  PRIMARY CARE PHYSICIAN: FELDPAUSCH, Chrissie Noa, MD   REQUESTING/REFERRING PHYSICIAN:  Dr.Shaevitz  CHIEF COMPLAINT: Hypotension and bradycardia.    Chief Complaint  Patient presents with  . Hypotension    HISTORY OF PRESENT ILLNESS:  Colleen Nguyen  is a 82 y.o. female with a known history of Recent discharge from hospital today after noon comes in with hypotension and bradycardia. Patient came to ER. Hours after the discharge from hospital today with bradycardia and hypotension with systolic blood pressure in 80s and heart rate in 40s. According to the family unable to wake her up from discharge time till she came to hospital at 3pm the day. Patient received Ativan and Flexeril by her prior to discharge. Because of forearm hypotension and bradycardia, deep T waves in V4 and V5 we are asked to admit the patient. 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 100/60 and heart rate is in 50s. Patient denies any chest pain.dose of Cardizem CD  Is increased  from 180 mg to 240 mg at discharge today. Her Eliquis is discontinued due to acute blood loss anemia requiring transfusion. Patient has appointment with Dr. Nehemiah Massed and Friday but the family is insisting that she stay overnight. Patient's troponin is slightly up at 0.5 but she had troponin elevation during recent admission it was 0.4.  PAST MEDICAL HISTORY:   Past Medical History  Diagnosis Date  . Hypertension   . Atrial fibrillation ([place])   . Anxiety   . Anemia     PAST SURGICAL HISTOIRY:   Past Surgical History  Procedure  Laterality Date  . Appendectomy    . Abdominal hysterectomy      SOCIAL HISTORY:   Social History  Substance Use Topics  . Smoking status: Never Smoker   . Smokeless tobacco: Not on file  . Alcohol Use: No    FAMILY HISTORY:   Family History  Problem Relation Age of Onset  . Leukemia Mother   . Stroke Father   . CAD Brother     DRUG ALLERGIES:   Allergies  Allergen Reactions  . Carisoprodol Other (See Comments)    Reaction:  Hypersomnolence   . Phenergan [Promethazine] Other (See Comments)    Reaction:  Hallucinations   . Norco [Hydrocodone-Acetaminophen] Itching and Rash  . Pravastatin Rash    REVIEW OF SYSTEMS:  CONSTITUTIONAL: No fever, fatigue or weakness.  EYES: No blurred or double vision.  EARS, NOSE, AND THROAT: No tinnitus or ear pain.  RESPIRATORY: No cough, shortness of breath, wheezing or hemoptysis.  CARDIOVASCULAR: No chest pain, orthopnea, edema.  GASTROINTESTINAL: No nausea, vomiting, diarrhea or abdominal pain.  GENITOURINARY: No dysuria, hematuria.  ENDOCRINE: No polyuria, nocturia,  HEMATOLOGY: No anemia, easy bruising or bleeding SKIN: No rash or lesion. MUSCULOSKELETAL: No joint pain or arthritis.   NEUROLOGIC: No tingling, numbness, weakness.  PSYCHIATRY: No anxiety or depression.   MEDICATIONS AT HOME:   Prior to Admission medications   Medication Sig Start Date End Date Taking? Authorizing Provider  albuterol (PROVENTIL HFA;VENTOLIN HFA) 108 (90 BASE) MCG/ACT inhaler Inhale 2 puffs into the lungs every 6 (  six) hours as needed for wheezing or shortness of breath. 01/05/15  Yes Victorino Dike, FNP  ALPRAZolam (XANAX) 0.5 MG tablet Take 0.5 mg by mouth 2 (two) times daily as needed for anxiety or sleep.   Yes Historical Provider, MD  diltiazem (CARDIZEM CD) 240 MG 24 hr capsule Take 1 capsule (240 mg total) by mouth daily. 07/24/15  Yes Bettey Costa, MD  feeding supplement (BOOST / RESOURCE BREEZE) LIQD Take 1 Container by mouth 3 (three)  times daily between meals. 07/24/15  Yes Bettey Costa, MD  HYDROcodone-acetaminophen (NORCO/VICODIN) 5-325 MG tablet Take 1-2 tablets by mouth every 4 (four) hours as needed for moderate pain.    Yes Historical Provider, MD  lisinopril (PRINIVIL,ZESTRIL) 10 MG tablet Take 10 mg by mouth 2 (two) times daily.    Yes Historical Provider, MD  metoprolol tartrate (LOPRESSOR) 25 MG tablet Take 25 mg by mouth 2 (two) times daily as needed (for increased heart rate).    Yes Historical Provider, MD  omeprazole (PRILOSEC) 20 MG capsule Take 20 mg by mouth daily.   Yes Historical Provider, MD  tiZANidine (ZANAFLEX) 4 MG tablet Take 2-4 mg by mouth every 8 (eight) hours as needed for muscle spasms.    Yes Historical Provider, MD      VITAL SIGNS:  Blood pressure 121/54, pulse 48, resp. rate 18, height 5' (1.524 m), weight 45.36 kg (100 lb), SpO2 94 %.  PHYSICAL EXAMINATION:  GENERAL:  82 y.o.-year-old patient lying in the bed with no acute distress.  EYES: Pupils equal, round, reactive to light and accommodation. No scleral icterus. Extraocular muscles intact.  HEENT: Head atraumatic, normocephalic. Oropharynx and nasopharynx clear.  NECK:  Supple, no jugular venous distention. No thyroid enlargement, no tenderness.  LUNGS: Normal breath sounds bilaterally, no wheezing, rales,rhonchi or crepitation. No use of accessory muscles of respiration.  CARDIOVASCULAR: S1, S2 normal. No murmurs, rubs, or gallops.  ABDOMEN: Soft, nontender, nondistended. Bowel sounds present. No organomegaly or mass.  EXTREMITIES: No pedal edema, cyanosis, or clubbing.  NEUROLOGIC: Cranial nerves II through XII are intact. Muscle strength 5/5 in all extremities. Sensation intact. Gait not checked.  PSYCHIATRIC: The patient is alert and oriented x 3.  SKIN: No obvious rash, lesion, or ulcer.   LABORATORY PANEL:   CBC  Recent Labs Lab 07/24/15 1516  WBC 5.1  HGB 11.0*  HCT 35.1  PLT 181    ------------------------------------------------------------------------------------------------------------------  Chemistries   Recent Labs Lab 07/23/15 0321 07/24/15 1516  NA 139 139  K 3.7 4.0  CL 109 110  CO2 24 21*  GLUCOSE 85 87  BUN 10 16  CREATININE 0.83 1.11*  CALCIUM 8.4* 8.6*  AST 19  --   ALT 9*  --   ALKPHOS 60  --   BILITOT 0.4  --    ------------------------------------------------------------------------------------------------------------------  Cardiac Enzymes  Recent Labs Lab 07/24/15 1516  TROPONINI 0.52*   ------------------------------------------------------------------------------------------------------------------  RADIOLOGY:  No results found.  EKG:   Orders placed or performed during the hospital encounter of 07/24/15  . ED EKG  . ED EKG  . EKG 12-Lead  . EKG 12-Lead  Sinus bradycardia at 46 bpm, T-wave inversion in V2 and V3, deep T-wave inversion in V4 and V5.-There are no changes compared to the EKG on April 23.  Assessment and plan Number 1G this afternoon likely due to sedation from Ativan and Flexeril. #2 hypotension and bradycardia likely related to medicine Cardizem CD, right now blood pressure and heart rate are improved.  Hold off on the metoprolol and Cardizem, monitored on telemetry, appreciate cardiology following. #3 EKG changes and slightly elevated troponins probably secondary to hypotension rather than Acute coronary syndrome. Patient troponins  To be trended, unable to use full anticoagulation secondary to recent acute blood loss anemia, requiring 2 units of transfusion and the ulcerated tumor in cecum appreciate  cardiology input for further testing in 82 yr old female. . And family is aware of this and they do not want any full anticoagulation.  #4 .protein  calorie malnutrition:continue boost supplement #5 acute renal failure: Due to hypotension and ATN: Hold the lisinopril, continue IV hydration.  Observation  admission  All the records are reviewed and case discussed with ED provider. Management plans discussed with the patient, family and they are in agreement.  CODE STATUS: full  TOTAL TIME TAKING CARE OF THIS PATIENT: 55 minutes.    Epifanio Lesches M.D on 07/24/2015 at 6:02 PM  Between 7am to 6pm - Pager - [phone]  After 6pm go to [email or web address] - password [email or web address] [facility]  Office  [phone]  CC: Primary care physician; [facility], Chrissie Noa, MD  Note: This dictation was prepared with Dragon dictation along with smaller phrase technology. Any transcriptional errors that result from this process are unintentional.

## 2015-07-24 NOTE — ED Provider Notes (Signed)
[facility] Emergency Department Provider Note  ____________________________________________  Time seen: Seen upon arrival to the emergency department  I have reviewed the triage vital signs and the nursing notes.   HISTORY  Chief Complaint Hypotension   HPI Cornie Braylen Laro is a 82 y.o. female with a recent history of GI bleed who is presenting to the emergency department today 3 hours after she was discharged from the hospital. Bradycardia with hypotension. She denies having any further episodes of bloody stool. She says that she is feeling at her baseline now.  Per EMS, her family woke her up to check her vital signs and she was with a systolic blood pressure in the 80s and a heart rate in the 40s. She denies any pain at this time.Also, the patient had received Ativan a muscle relaxer in addition to pain meds all within 1 hour prior to leaving the hospital.   Past Medical History  Diagnosis Date  . Hypertension   . Atrial fibrillation ([place])   . Anxiety   . Anemia     Patient Active Problem List   Diagnosis Date Noted  . Malnutrition of moderate degree 07/23/2015  . Iron deficiency anemia, unspecified   . Iron deficiency anemia secondary to blood loss (chronic)   . Neoplasm of digestive system   . Acute blood loss anemia 07/22/2015  . GI bleed 07/22/2015  . Weakness generalized 07/22/2015  . Chronic atrial fibrillation ([place]) 07/22/2015    Past Surgical History  Procedure Laterality Date  . Appendectomy    . Abdominal hysterectomy      Current Outpatient Rx  Name  Route  Sig  Dispense  Refill  . albuterol (PROVENTIL HFA;VENTOLIN HFA) 108 (90 BASE) MCG/ACT inhaler   Inhalation   Inhale 2 puffs into the lungs every 6 (six) hours as needed for wheezing or shortness of breath.   1 Inhaler   2   . ALPRAZolam (XANAX) 0.5 MG tablet   Oral   Take 1 tablet (0.5 mg total) by mouth 2 (two) times daily as needed. For anxiety/sleep.   30  tablet   0   . diltiazem (CARDIZEM CD) 240 MG 24 hr capsule   Oral   Take 1 capsule (240 mg total) by mouth daily.   30 capsule   0   . feeding supplement (BOOST / RESOURCE BREEZE) LIQD   Oral   Take 1 Container by mouth 3 (three) times daily between meals.   30 Container   0   . HYDROcodone-acetaminophen (NORCO/VICODIN) 5-325 MG tablet   Oral   Take 1-2 tablets by mouth See admin instructions. Take 1 to 2 tablets by mouth every 4 to 6 hours as needed for pain.         Marland Kitchen lisinopril (PRINIVIL,ZESTRIL) 10 MG tablet   Oral   Take 20 mg by mouth daily.         . metoprolol tartrate (LOPRESSOR) 25 MG tablet   Oral   Take 25 mg by mouth 2 (two) times daily as needed. For heart rate.         Marland Kitchen omeprazole (PRILOSEC) 20 MG capsule   Oral   Take 20 mg by mouth daily.         Marland Kitchen tiZANidine (ZANAFLEX) 4 MG tablet   Oral   Take 4 mg by mouth every 8 (eight) hours as needed. For muscle spasm.           Allergies Carisoprodol; Norco; Phenergan; Pravastatin; and  Tizanidine  Family History  Problem Relation Age of Onset  . Leukemia Mother   . Stroke Father   . CAD Brother     Social History Social History  Substance Use Topics  . Smoking status: Never Smoker   . Smokeless tobacco: None  . Alcohol Use: No    Review of Systems Constitutional: No fever/chills Eyes: No visual changes. ENT: No sore throat. Cardiovascular: Denies chest pain. Respiratory: Denies shortness of breath. Gastrointestinal: No abdominal pain.  No nausea, no vomiting.  No diarrhea.  No constipation. Genitourinary: Negative for dysuria. Musculoskeletal: Negative for back pain. Skin: Negative for rash. Neurological: Negative for headaches, focal weakness or numbness.  10-point ROS otherwise negative.  ____________________________________________   PHYSICAL EXAM:  VITAL SIGNS: ED Triage Vitals  Enc Vitals Group     BP --      Pulse --      Resp --      Temp --      Temp src --       SpO2 --      Weight 07/24/15 1513 100 lb (45.36 kg)     Height 07/24/15 1513 5' (1.524 m)     Head Cir --      Peak Flow --      Pain Score --      Pain Loc --      Pain Edu? --      Excl. in [place]? --     Constitutional: Alert and oriented. Well appearing and in no acute distress. Eyes: Conjunctivae are normal. PERRL. EOMI. Head: Atraumatic. Nose: No congestion/rhinnorhea. Mouth/Throat: Mucous membranes are moist.   Neck: No stridor.   Cardiovascular: Normal rate, regular rhythm. Grossly normal heart sounds.  Respiratory: Normal respiratory effort.  No retractions. Lungs CTAB. Gastrointestinal: Soft and nontender. No distention.  No CVA tenderness. Musculoskeletal: No lower extremity tenderness nor edema.  No joint effusions. Neurologic:  Normal speech and language. No gross focal neurologic deficits are appreciated.  Skin:  Skin is warm, dry and intact. No rash noted. Psychiatric: Mood and affect are normal. Speech and behavior are normal.  ____________________________________________   LABS (all labs ordered are listed, but only abnormal results are displayed)  Labs Reviewed  CBC WITH DIFFERENTIAL/PLATELET - Abnormal; Notable for the following:    Hemoglobin 11.0 (*)    MCV 78.3 (*)    MCH 24.5 (*)    MCHC 31.3 (*)    RDW 22.1 (*)    All other components within normal limits  BASIC METABOLIC PANEL - Abnormal; Notable for the following:    CO2 21 (*)    Creatinine, Ser 1.11 (*)    Calcium 8.6 (*)    GFR calc non Af Amer 42 (*)    GFR calc Af Amer 49 (*)    All other components within normal limits  TROPONIN I - Abnormal; Notable for the following:    Troponin I 0.52 (*)    All other components within normal limits  URINALYSIS COMPLETEWITH MICROSCOPIC ([facility] ONLY)   ____________________________________________  EKG  ED ECG REPORT I, Doran Stabler, the attending physician, personally viewed and interpreted this ECG.   Date: 07/24/2015  EKG Time:  1520  Rate: 46  Rhythm: Sinus bradycardia  Axis: Normal axis  Intervals:Prolonged QT interval  ST&T Change: No ST segment elevation or depression. Deep T-wave inversions in V2 through V5 which are new.  ____________________________________________  RADIOLOGY   ____________________________________________   PROCEDURES   ____________________________________________  INITIAL IMPRESSION / ASSESSMENT AND PLAN / ED COURSE  Pertinent labs & imaging results that were available during my care of the patient were reviewed by me and considered in my medical decision making (see chart for details).  ----------------------------------------- 5:19 PM on 07/24/2015 -----------------------------------------  Patient with improved blood pressure. Heart rate still in the 40s. Denying any chest pain or shortness of breath at this time. I discussed case with Dr. Nehemiah Massed because of the patient's continually elevated troponin which has increased to 0.52 today. Also now the patient is showing deep T-wave inversions in the precordial leads. The patient is at high risk for anticoagulation because of her recent GI bleed requiring 2 units of blood transfusion. I discussed several possible plans with Dr. Nehemiah Massed who suggested we either start heparin without a bolus or observed without anticoagulation to see the patient become symptomatic. I discussed both scenarios with the family as well as the patient and they would rather not have heparin at this time because of the patient's recent history. The patient will be admitted to the hospital. Signed out to Dr. Vella Kohler.   ____________________________________________   FINAL CLINICAL IMPRESSION(S) / ED DIAGNOSES  Hypotension. Elevated troponin.    Orbie Pyo, MD 07/24/15 [phone]

## 2015-07-24 NOTE — Progress Notes (Signed)
Pt is being discharged today, belongings returned, Iv removed. Discharge paperwork explained to pt and her family who she lives with, they verified understanding.

## 2015-07-24 NOTE — ED Notes (Signed)
Pt arrived to ED from home after family reports taking a BP of 80s/40s while at home. Pt was recently d/c from [facility] at 12 noon today after receiving treatment for internal bleeding from a polyp. Pt received blood while in ED. Pt reports feeling "weaker" than when discharged. Pt presents to ED alert and oriented x 4 and in no acute distress.

## 2015-07-24 NOTE — Progress Notes (Signed)
PT Cancellation Note  Patient Details Name: Colleen Nguyen MRN: [id number] DOB: Jul 26, 1922   Cancelled Treatment:    Reason Eval/Treat Not Completed: Medical issues which prohibited therapy.  Pt's HR at rest 140's to 150's bpm (nursing aware).  Will hold PT at this time and re-attempt PT eval at a later date/time as medically appropriate.   Raquel Sarna Sanjit Mcmichael 07/24/2015, [place], [place]

## 2015-07-24 NOTE — Care Management Important Message (Signed)
Important Message  Patient Details  Name: Colleen Nguyen MRN: [id number] Date of Birth: [birth year]   Medicare Important Message Given:  Yes    Juliann Pulse A Raneshia Derick 07/24/2015, 10:30 AM

## 2015-07-24 NOTE — Discharge Summary (Signed)
[facility] NAME: Colleen Nguyen    MR#:  [id number]  DATE OF BIRTH:  [birth year]  DATE OF ADMISSION:  07/22/2015 ADMITTING PHYSICIAN: Idelle Crouch, MD  DATE OF DISCHARGE: 07/24/2015  PRIMARY CARE PHYSICIAN: FELDPAUSCH, DALE E, MD    ADMISSION DIAGNOSIS:  Anemia, unspecified anemia type [D64.9]  DISCHARGE DIAGNOSIS:  Principal Problem:   GI bleed Active Problems:   Acute blood loss anemia   Weakness generalized   Chronic atrial fibrillation (HCC)   Malnutrition of moderate degree   Iron deficiency anemia, unspecified   Iron deficiency anemia secondary to blood loss (chronic)   Neoplasm of digestive system   SECONDARY DIAGNOSIS:   Past Medical History  Diagnosis Date  . Hypertension   . Atrial fibrillation ([place])   . Anxiety   . Anemia     HOSPITAL COURSE:   82 year old female with a history of essential hypertension and chronic atrial fibrillation on anticoagulation who presented with symptomatic anemia.  1. Acute blood loss anemia: Patient received 2 unit PRBC. Cardiology recommended to discontinue apixaban. EGD shows gastritis and benign-appearing esophageal stenosis.  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 Family has decided on no further evaluation or investigation on the ulcerated tumor. Her hemoglobin remains stable. She should continue PPI.   2. Chronic atrial fibrillation: Patient did have elevated heart rates. She was given diltiazem IV. She often times at home has elevated heart rates ranging in the 120s or 150s. She is prescribed a when necessary metoprolol ordered. I spoke with her cardiologist, Dr. Jose Persia who recommended increasing diltiazem from 180-240 mg. She will continue on her when necessary metoprolol. We have arranged follow up on Friday with her cardiologist. In the interim she will record her heart rates at least 3-4 times a day.  3. Essential hypertension:  Blood pressures acceptable. Continue diltiazem, lisinopril and metoprolol.   4. Protein calorie malnutrition: Continue boost supplement   5. Elevated troponin: Due to demand ischemia from significant anemia.     DISCHARGE CONDITIONS AND DIET:   Patient stable for discharge on a regular diet  CONSULTS OBTAINED:  Treatment Team:  Teodoro Spray, MD Fredonia Highland, MD Lucilla Lame, MD  DRUG ALLERGIES:   Allergies  Allergen Reactions  . Carisoprodol     Other reaction(s): Other (See Comments) Hypersommulence requiring EMS eval.  . Norco [Hydrocodone-Acetaminophen] Itching  . Phenergan [Promethazine] Other (See Comments)    Mental disturbance  . Pravastatin Rash  . Tizanidine Rash    DISCHARGE MEDICATIONS:   Current Discharge Medication List    START taking these medications   Details  feeding supplement (BOOST / RESOURCE BREEZE) LIQD Take 1 Container by mouth 3 (three) times daily between meals. Qty: 30 Container, Refills: 0      CONTINUE these medications which have CHANGED   Details  ALPRAZolam (XANAX) 0.5 MG tablet Take 1 tablet (0.5 mg total) by mouth 2 (two) times daily as needed. For anxiety/sleep. Qty: 30 tablet, Refills: 0    diltiazem (CARDIZEM CD) 240 MG 24 hr capsule Take 1 capsule (240 mg total) by mouth daily. Qty: 30 capsule, Refills: 0      CONTINUE these medications which have NOT CHANGED   Details  albuterol (PROVENTIL HFA;VENTOLIN HFA) 108 (90 BASE) MCG/ACT inhaler Inhale 2 puffs into the lungs every 6 (six) hours as needed for wheezing or shortness of breath. Qty: 1 Inhaler, Refills: 2    HYDROcodone-acetaminophen (NORCO/VICODIN)  5-325 MG tablet Take 1-2 tablets by mouth See admin instructions. Take 1 to 2 tablets by mouth every 4 to 6 hours as needed for pain.    lisinopril (PRINIVIL,ZESTRIL) 10 MG tablet Take 20 mg by mouth daily.    metoprolol tartrate (LOPRESSOR) 25 MG tablet Take 25 mg by mouth 2 (two) times daily as needed. For  heart rate.    omeprazole (PRILOSEC) 20 MG capsule Take 20 mg by mouth daily.    tiZANidine (ZANAFLEX) 4 MG tablet Take 4 mg by mouth every 8 (eight) hours as needed. For muscle spasm.      STOP taking these medications     apixaban (ELIQUIS) 2.5 MG TABS tablet               Today   CHIEF COMPLAINT:  Patient is doing well this morning. Patient denies palpitations. Her heart rates were elevated in the 130s.   VITAL SIGNS:  Blood pressure 119/82, pulse 128, temperature 97.4 F (36.3 C), temperature source Oral, resp. rate 18, height 5' (1.524 m), weight 45.541 kg (100 lb 6.4 oz), SpO2 97 %.   REVIEW OF SYSTEMS:  Review of Systems  Constitutional: Negative for fever, chills and malaise/fatigue.  HENT: Negative for ear discharge, ear pain, hearing loss, nosebleeds and sore throat.   Eyes: Negative for blurred vision and pain.  Respiratory: Negative for cough, hemoptysis, shortness of breath and wheezing.   Cardiovascular: Negative for chest pain, palpitations and leg swelling.  Gastrointestinal: Negative for nausea, vomiting, abdominal pain, diarrhea and blood in stool.  Genitourinary: Negative for dysuria.  Musculoskeletal: Negative for back pain.  Neurological: Negative for dizziness, tremors, speech change, focal weakness, seizures and headaches.  Endo/Heme/Allergies: Does not bruise/bleed easily.  Psychiatric/Behavioral: Negative for depression, suicidal ideas and hallucinations.     PHYSICAL EXAMINATION:  GENERAL:  82 y.o.-year-old patient lying in the bed with no acute distress.  NECK:  Supple, no jugular venous distention. No thyroid enlargement, no tenderness.  LUNGS: Normal breath sounds bilaterally, no wheezing, rales,rhonchi  No use of accessory muscles of respiration.  CARDIOVASCULAR: Tachycardia irregular, irregular. No murmurs, rubs, or gallops.  ABDOMEN: Soft, non-tender, non-distended. Bowel sounds present. No organomegaly or mass.  EXTREMITIES: No  pedal edema, cyanosis, or clubbing.  PSYCHIATRIC: The patient is alert and oriented x 3.  SKIN: No obvious rash, lesion, or ulcer.   DATA REVIEW:   CBC  Recent Labs Lab 07/24/15 0444  WBC 4.4  HGB 10.5*  HCT 32.9*  PLT 180    Chemistries   Recent Labs Lab 07/23/15 0321  NA 139  K 3.7  CL 109  CO2 24  GLUCOSE 85  BUN 10  CREATININE 0.83  CALCIUM 8.4*  AST 19  ALT 9*  ALKPHOS 60  BILITOT 0.4    Cardiac Enzymes  Recent Labs Lab 07/22/15 1155 07/22/15 2112 07/23/15 0321  TROPONINI <0.03 0.09* 0.40*    Microbiology Results  @[email or web address] @  RADIOLOGY:  Ct Angio Chest/abd/pel For Dissection W And/or W/wo  07/22/2015  CLINICAL DATA:  Hypertension. Atrial fibrillation. Clinical suspicion for aortic dissection. EXAM: CT ANGIOGRAPHY CHEST, ABDOMEN AND PELVIS TECHNIQUE: Multidetector CT imaging through the chest, abdomen and pelvis was performed using the standard protocol during bolus administration of intravenous contrast. Multiplanar reconstructed images and MIPs were obtained and reviewed to evaluate the vascular anatomy. CONTRAST:  100 mL Isovue 370 COMPARISON:  AP CT on 01/10/2013 FINDINGS: CTA CHEST FINDINGS Mild aneurysm of ascending thoracic aorta seen measuring 4.1 cm in maximum  diameter. Calcified atherosclerotic plaque noted, however there is no evidence of thoracic aortic dissection or mediastinal hematoma. Mild cardiomegaly is noted, without evidence of pericardial effusion. No lymphadenopathy identified within the thorax. No evidence pleural or pericardial effusion. Pulmonary arteries are well opacified and no pulmonary emboli visualized. Mild scarring seen in the right middle and lower lobes. No evidence of pulmonary infiltrate or pleural effusion. Review of the MIP images confirms the above findings. CTA ABDOMEN AND PELVIS FINDINGS Atherosclerotic plaque seen involving the abdominal aorta however there is no evidence of abdominal aortic aneurysm or dissection.  No evidence of iliac artery aneurysm or dissection. The liver, gallbladder, spleen, pancreas, adrenal glands, and kidneys are normal in appearance. No soft tissue masses or lymphadenopathy identified. Sigmoid diverticulosis is demonstrated, however there is no evidence of diverticulitis. No other inflammatory process or abnormal fluid collections identified. Lumbar spine degenerative changes noted with degenerative anterolisthesis seen at levels of L1-2 and L2-3. No suspicious lytic or sclerotic bone lesions identified. Review of the MIP images confirms the above findings. IMPRESSION: Mild aneurysm of ascending thoracic aorta measuring 4.1 cm. No evidence of aortic dissection or other acute findings. No evidence of abdominal aortic or iliac artery aneurysm or dissection. Colonic diverticulosis. No radiographic evidence of diverticulitis or other acute findings. Electronically Signed   By: Earle Gell M.D.   On: 07/22/2015 13:41      Management plans discussed with the patient and she is in agreement. Stable for discharge home with [facility]  Patient should follow up with PCP and Dr Nehemiah Massed  CODE STATUS:     Code Status Orders        Start     Ordered   07/22/15 1623  Full code   Continuous     07/22/15 1622    Code Status History    Date Active Date Inactive Code Status Order ID Comments User Context   This patient has a current code status but no historical code status.      TOTAL TIME TAKING CARE OF THIS PATIENT: 35 minutes.    Note: This dictation was prepared with Dragon dictation along with smaller phrase technology. Any transcriptional errors that result from this process are unintentional.  Chrishun Scheer M.D on 07/24/2015 at 10:39 AM  Between 7am to 6pm - Pager - [phone] After 6pm go to [email or web address] - password [email or web address] [facility]  Office  [phone]  CC: Primary care physician; [facility], Chrissie Noa, MD

## 2015-07-24 NOTE — ED Notes (Signed)
Pt up to bedside commode with one assist.

## 2015-07-25 ENCOUNTER — Encounter: Payer: Self-pay | Admitting: Gastroenterology

## 2015-07-25 DIAGNOSIS — I952 Hypotension due to drugs: Secondary | ICD-10-CM | POA: Diagnosis not present

## 2015-07-25 DIAGNOSIS — I482 Chronic atrial fibrillation: Secondary | ICD-10-CM | POA: Diagnosis not present

## 2015-07-25 DIAGNOSIS — I959 Hypotension, unspecified: Secondary | ICD-10-CM | POA: Diagnosis not present

## 2015-07-25 DIAGNOSIS — I480 Paroxysmal atrial fibrillation: Secondary | ICD-10-CM | POA: Diagnosis not present

## 2015-07-25 DIAGNOSIS — R001 Bradycardia, unspecified: Secondary | ICD-10-CM | POA: Diagnosis not present

## 2015-07-25 DIAGNOSIS — R748 Abnormal levels of other serum enzymes: Secondary | ICD-10-CM | POA: Diagnosis not present

## 2015-07-25 LAB — TROPONIN I
Troponin I: 0.37 ng/mL — ABNORMAL HIGH (ref <0.031)
Troponin I: 0.36 ng/mL — ABNORMAL HIGH (ref <0.031)

## 2015-07-25 LAB — BASIC METABOLIC PANEL
Anion gap: 8 (ref 5–15)
BUN: 18 mg/dL (ref 6–20)
CHLORIDE: 110 mmol/L (ref 101–111)
CO2: 20 mmol/L — AB (ref 22–32)
Calcium: 8.3 mg/dL — ABNORMAL LOW (ref 8.9–10.3)
Creatinine, Ser: 0.92 mg/dL (ref 0.44–1.00)
GFR calc Af Amer: >60 mL/min (ref >60)
GFR calc non Af Amer: 53 mL/min — ABNORMAL LOW (ref >60)
GLUCOSE: 69 mg/dL (ref 65–99)
POTASSIUM: 3.4 mmol/L — AB (ref 3.5–5.1)
Sodium: 138 mmol/L (ref 135–145)

## 2015-07-25 LAB — CBC
HEMATOCRIT: 30.8 % — AB (ref 35.0–47.0)
Hemoglobin: 9.7 g/dL — ABNORMAL LOW (ref 12.0–16.0)
MCH: 24.3 pg — AB (ref 26.0–34.0)
MCHC: 31.5 g/dL — AB (ref 32.0–36.0)
MCV: 76.9 fL — AB (ref 80.0–100.0)
Platelets: 171 10*3/uL (ref 150–440)
RBC: 4.01 MIL/uL (ref 3.80–5.20)
RDW: 22.3 % — AB (ref 11.5–14.5)
WBC: 4.7 10*3/uL (ref 3.6–11.0)

## 2015-07-25 LAB — GLUCOSE, CAPILLARY
GLUCOSE-CAPILLARY: 51 mg/dL — AB (ref 65–99)
Glucose-Capillary: 90 mg/dL (ref 65–99)

## 2015-07-25 MED ORDER — DIPHENHYDRAMINE HCL 25 MG PO TABS: 12.5000 mg | ORAL_TABLET | ORAL | Status: DC | PRN

## 2015-07-25 MED ORDER — DIPHENHYDRAMINE HCL 50 MG/ML IJ SOLN
12.5000 mg | Freq: Once | INTRAMUSCULAR | Status: AC
  Administered 2015-07-25: 12.5 mg via INTRAVENOUS
  Filled 2015-07-25: qty 1

## 2015-07-25 MED ORDER — DILTIAZEM HCL ER COATED BEADS 180 MG PO CP24: 180.0000 mg | ORAL_CAPSULE | Freq: Every day | ORAL | Status: DC

## 2015-07-25 MED ORDER — HYDROCODONE-ACETAMINOPHEN 5-325 MG PO TABS: 1.0000 | ORAL_TABLET | ORAL | Status: DC | PRN

## 2015-07-25 NOTE — Consult Note (Signed)
[facility] Cardiology Consultation Note  Patient ID: Colleen Nguyen, MRN: [id number], DOB/AGE: [birth year] 80 y.o. Admit date: 07/24/2015   Date of Consult: 07/25/2015 Primary Physician: Sofie Hartigan, MD Primary Cardiologist: Nehemiah Massed  Chief Complaint:  Chief Complaint  Patient presents with  . Hypotension   Reason for Consult:hypotesnion  HPI: 82 y.o. female with htn afib cad and recent dc after bleeding and hsd weakness fatigue and low bp most consistatn with medication over usage and circumstances without evidence of MI or chf at this time now in nsr  Past Medical History  Diagnosis Date  . Hypertension   . Atrial fibrillation ([place])   . Anxiety   . Anemia       Surgical History:  Past Surgical History  Procedure Laterality Date  . Appendectomy    . Abdominal hysterectomy    . Esophagogastroduodenoscopy (egd) with propofol N/A 07/23/2015    Procedure: ESOPHAGOGASTRODUODENOSCOPY (EGD) WITH PROPOFOL;  Surgeon: Lucilla Lame, MD;  Location: [facility] ENDOSCOPY;  Service: Endoscopy;  Laterality: N/A;  . Colonoscopy with propofol N/A 07/23/2015    Procedure: COLONOSCOPY WITH PROPOFOL;  Surgeon: Lucilla Lame, MD;  Location: [facility] ENDOSCOPY;  Service: Endoscopy;  Laterality: N/A;     Home Meds: Prior to Admission medications   Medication Sig Start Date End Date Taking? Authorizing Provider  albuterol (PROVENTIL HFA;VENTOLIN HFA) 108 (90 BASE) MCG/ACT inhaler Inhale 2 puffs into the lungs every 6 (six) hours as needed for wheezing or shortness of breath. 01/05/15  Yes Victorino Dike, FNP  ALPRAZolam (XANAX) 0.5 MG tablet Take 0.5 mg by mouth 2 (two) times daily as needed for anxiety or sleep.   Yes Historical Provider, MD  feeding supplement (BOOST / RESOURCE BREEZE) LIQD Take 1 Container by mouth 3 (three) times daily between meals. 07/24/15  Yes Sital Mody, MD  lisinopril (PRINIVIL,ZESTRIL) 10 MG tablet Take 10 mg by mouth 2 (two) times daily.    Yes Historical Provider, MD   metoprolol tartrate (LOPRESSOR) 25 MG tablet Take 25 mg by mouth 2 (two) times daily as needed (for increased heart rate).    Yes Historical Provider, MD  omeprazole (PRILOSEC) 20 MG capsule Take 20 mg by mouth daily.   Yes Historical Provider, MD  tiZANidine (ZANAFLEX) 4 MG tablet Take 2-4 mg by mouth every 8 (eight) hours as needed for muscle spasms.    Yes Historical Provider, MD  diltiazem (CARDIZEM CD) 180 MG 24 hr capsule Take 1 capsule (180 mg total) by mouth daily. 07/25/15   Bettey Costa, MD  HYDROcodone-acetaminophen (NORCO/VICODIN) 5-325 MG tablet Take 1 tablet by mouth every 4 (four) hours as needed for moderate pain. 07/25/15   Bettey Costa, MD    Inpatient Medications:  . diltiazem  30 mg Oral Q6H  . docusate sodium  100 mg Oral BID  . feeding supplement  1 Container Oral TID BM  . heparin  5,000 Units Subcutaneous Q8H  . pantoprazole  40 mg Oral Daily      Allergies:  Allergies  Allergen Reactions  . Carisoprodol Other (See Comments)    Reaction:  Hypersomnolence   . Phenergan [Promethazine] Other (See Comments)    Reaction:  Hallucinations   . Norco [Hydrocodone-Acetaminophen] Itching and Rash  . Pravastatin Rash    Social History   Social History  . Marital Status: Widowed    Spouse Name: N/A  . Number of Children: N/A  . Years of Education: N/A   Occupational History  . Not on file.  Social History Main Topics  . Smoking status: Never Smoker   . Smokeless tobacco: Not on file  . Alcohol Use: No  . Drug Use: No  . Sexual Activity: Not on file   Other Topics Concern  . Not on file   Social History Narrative     Family History  Problem Relation Age of Onset  . Leukemia Mother   . Stroke Father   . CAD Brother      Review of Systems Positive for weakness Negative for: General:  chills, fever, night sweats or weight changes.  Cardiovascular: PND orthopnea syncope dizziness  Dermatological skin lesions rashes Respiratory: Cough  congestion Urologic: Frequent urination urination at night and hematuria Abdominal: negative for nausea, vomiting, diarrhea, bright red blood per rectum, melena, or hematemesis Neurologic: negative for visual changes, and/or hearing changes  All other systems reviewed and are otherwise negative except as noted above.  Labs:  Recent Labs  07/24/15 1516 07/24/15 1831 07/24/15 2345 07/25/15 0455  TROPONINI 0.52* 0.38* 0.37* 0.36*   Lab Results  Component Value Date   WBC 4.7 07/25/2015   HGB 9.7* 07/25/2015   HCT 30.8* 07/25/2015   MCV 76.9* 07/25/2015   PLT 171 07/25/2015    Recent Labs Lab 07/23/15 0321  07/25/15 0455  NA 139  < > 138  K 3.7  < > 3.4*  CL 109  < > 110  CO2 24  < > 20*  BUN 10  < > 18  CREATININE 0.83  < > 0.92  CALCIUM 8.4*  < > 8.3*  PROT 6.6  --   --   BILITOT 0.4  --   --   ALKPHOS 60  --   --   ALT 9*  --   --   AST 19  --   --   GLUCOSE 85  < > 69  < > = values in this interval not displayed. No results found for: CHOL, HDL, LDLCALC, TRIG No results found for: DDIMER  Radiology/Studies:  Ct Angio Chest/abd/pel For Dissection W And/or W/wo  07/22/2015  CLINICAL DATA:  Hypertension. Atrial fibrillation. Clinical suspicion for aortic dissection. EXAM: CT ANGIOGRAPHY CHEST, ABDOMEN AND PELVIS TECHNIQUE: Multidetector CT imaging through the chest, abdomen and pelvis was performed using the standard protocol during bolus administration of intravenous contrast. Multiplanar reconstructed images and MIPs were obtained and reviewed to evaluate the vascular anatomy. CONTRAST:  100 mL Isovue 370 COMPARISON:  AP CT on 01/10/2013 FINDINGS: CTA CHEST FINDINGS Mild aneurysm of ascending thoracic aorta seen measuring 4.1 cm in maximum diameter. Calcified atherosclerotic plaque noted, however there is no evidence of thoracic aortic dissection or mediastinal hematoma. Mild cardiomegaly is noted, without evidence of pericardial effusion. No lymphadenopathy identified  within the thorax. No evidence pleural or pericardial effusion. Pulmonary arteries are well opacified and no pulmonary emboli visualized. Mild scarring seen in the right middle and lower lobes. No evidence of pulmonary infiltrate or pleural effusion. Review of the MIP images confirms the above findings. CTA ABDOMEN AND PELVIS FINDINGS Atherosclerotic plaque seen involving the abdominal aorta however there is no evidence of abdominal aortic aneurysm or dissection. No evidence of iliac artery aneurysm or dissection. The liver, gallbladder, spleen, pancreas, adrenal glands, and kidneys are normal in appearance. No soft tissue masses or lymphadenopathy identified. Sigmoid diverticulosis is demonstrated, however there is no evidence of diverticulitis. No other inflammatory process or abnormal fluid collections identified. Lumbar spine degenerative changes noted with degenerative anterolisthesis seen at levels of L1-2  and L2-3. No suspicious lytic or sclerotic bone lesions identified. Review of the MIP images confirms the above findings. IMPRESSION: Mild aneurysm of ascending thoracic aorta measuring 4.1 cm. No evidence of aortic dissection or other acute findings. No evidence of abdominal aortic or iliac artery aneurysm or dissection. Colonic diverticulosis. No radiographic evidence of diverticulitis or other acute findings. Electronically Signed   By: Earle Gell M.D.   On: 07/22/2015 13:41    EKG: nsr  Weights: Filed Weights   07/24/15 1513 07/24/15 2118 07/25/15 0500  Weight: 100 lb (45.36 kg) 101 lb 3.2 oz (45.904 kg) 101 lb 3.2 oz (45.904 kg)     Physical Exam: Blood pressure 148/69, pulse 71, temperature 98.2 F (36.8 C), temperature source Oral, resp. rate 16, height 5' (1.524 m), weight 101 lb 3.2 oz (45.904 kg), SpO2 100 %. Body mass index is 19.76 kg/(m^2). General: Well developed, well nourished, in no acute distress. Head eyes ears nose throat: Normocephalic, atraumatic, sclera non-icteric, no  xanthomas, nares are without discharge. No apparent thyromegaly and/or mass  Lungs: Normal respiratory effort.  no wheezes, no rales, no rhonchi.  Heart: RRR with normal S1 S2. no murmur gallop, no rub, PMI is normal size and placement, carotid upstroke normal without bruit, jugular venous pressure is normal Abdomen: Soft, non-tender, non-distended with normoactive bowel sounds. No hepatomegaly. No rebound/guarding. No obvious abdominal masses. Abdominal aorta is normal size without bruit Extremities: No edema. no cyanosis, no clubbing, no ulcers  Peripheral : 2+ bilateral upper extremity pulses, 2+ bilateral femoral pulses, 2+ bilateral dorsal pedal pulse Neuro: Alert and oriented. No facial asymmetry. No focal deficit. Moves all extremities spontaneously. Musculoskeletal: Normal muscle tone without kyphosis Psych:  Responds to questions appropriately with a normal affect.    Assessment: 82yo with known afib now in nsr with appropriate tx having episode of bradycardia and hypotension consistant with medication overusage now improved without evidence of MI  Plan: 1.reinstate previous meds for htn and hr control and watch for improvments 2.no further cardic diagnositcs at this time 3.ok for dc to home with fu tomorrow  Signed, Corey Skains M.D. [facility] 07/25/2015, 3:29 PM

## 2015-07-25 NOTE — Care Management Note (Signed)
Case Management Note  Patient Details  Name: Allisson Zimny MRN: [id number] Date of Birth: 28-Jan-1923  Subjective/Objective:  Patient just discharged yesterday. TC to [facility] and they will call patient after discharging today to start previously ordered services of SN and PT. Patient has a Corporate investment banker. PT recommending rolling walker. Requested delivery from [place] with [facility]. Patient and daughter updated at bedside. No new concerns reported. Case closed                  Action/Plan:   Expected Discharge Date:     07/25/2015             Expected Discharge Plan:  [place]  In-House Referral:     Discharge planning Services     Post Acute Care Choice:  Durable Medical Equipment, Home Health Choice offered to:  Patient  DME Arranged:  Walker rolling DME Agency:  [place] Arranged:  RN, PT [facility] Agency:  [place]  Status of Service:  Completed, signed off  Medicare Important Message Given:    Date Medicare IM Given:    Medicare IM give by:    Date Additional Medicare IM Given:    Additional Medicare Important Message give by:     If discussed at [place] of Stay Meetings, dates discussed:    Additional Comments:  Jolly Mango, RN 07/25/2015, 9:54 AM

## 2015-07-25 NOTE — Discharge Summary (Signed)
[facility] [place] NAME: Colleen Nguyen    MR#:  [id number]  DATE OF BIRTH:  [birth year]  DATE OF ADMISSION:  07/24/2015 ADMITTING PHYSICIAN: Epifanio Lesches, MD  DATE OF DISCHARGE: 07/25/2015  PRIMARY CARE PHYSICIAN: FELDPAUSCH, DALE E, MD    ADMISSION DIAGNOSIS:  Elevated troponin [R79.89] Hypotension, unspecified hypotension type [I95.9]  DISCHARGE DIAGNOSIS:  Active Problems:   Bradycardia   SECONDARY DIAGNOSIS:   Past Medical History  Diagnosis Date  . Hypertension   . Atrial fibrillation ([place])   . Anxiety   . Anemia     HOSPITAL COURSE:   82 year old female who presented with symptomatic hypotension and bradycardia. For further details please further H&P.  1. Hypotension with bradycardia: This was due to diltiazem that was given in the morning for her atrial fibrillation. Her heart rate and blood pressure have much improved. Diltiazem was initially increased from last admission from 180 mg to 240 mg. It will now be back to 180 mg. She'll follow-up with her cardiologist Dr. Nehemiah Massed on Friday.  2. 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 Continue diltiazem 180 mg and when necessary metoprolol for elevated heart rates. At home her heart rates to run between 120s and 150s at times.  3. Essential hypertension: Blood pressure is now acceptable. Patient will continue on diltiazem, lisinopril and when necessary metoprolol. Her blood pressure was controlled on these medications at home prior to admission.  4. Protein calorie malnutrition: Continue boost supplementation.  5. Elevated troponin: This is due to demand ischemia from hypotension and not ACS.  DISCHARGE CONDITIONS AND DIET:   Stable for discharge on a regular diet  CONSULTS OBTAINED:  Treatment Team:  Corey Skains, MD  DRUG ALLERGIES:   Allergies  Allergen  Reactions  . Carisoprodol Other (See Comments)    Reaction:  Hypersomnolence   . Phenergan [Promethazine] Other (See Comments)    Reaction:  Hallucinations   . Norco [Hydrocodone-Acetaminophen] Itching and Rash  . Pravastatin Rash    DISCHARGE MEDICATIONS:   Current Discharge Medication List    CONTINUE these medications which have CHANGED   Details  diltiazem (CARDIZEM CD) 180 MG 24 hr capsule Take 1 capsule (180 mg total) by mouth daily. Qty: 30 capsule, Refills: 0    HYDROcodone-acetaminophen (NORCO/VICODIN) 5-325 MG tablet Take 1 tablet by mouth every 4 (four) hours as needed for moderate pain. Qty: 20 tablet, Refills: 0      CONTINUE these medications which have NOT CHANGED   Details  albuterol (PROVENTIL HFA;VENTOLIN HFA) 108 (90 BASE) MCG/ACT inhaler Inhale 2 puffs into the lungs every 6 (six) hours as needed for wheezing or shortness of breath. Qty: 1 Inhaler, Refills: 2    ALPRAZolam (XANAX) 0.5 MG tablet Take 0.5 mg by mouth 2 (two) times daily as needed for anxiety or sleep.    feeding supplement (BOOST / RESOURCE BREEZE) LIQD Take 1 Container by mouth 3 (three) times daily between meals. Qty: 30 Container, Refills: 0    lisinopril (PRINIVIL,ZESTRIL) 10 MG tablet Take 10 mg by mouth 2 (two) times daily.     metoprolol tartrate (LOPRESSOR) 25 MG tablet Take 25 mg by mouth 2 (two) times daily as needed (for increased heart rate).     omeprazole (PRILOSEC) 20 MG capsule Take 20 mg by mouth daily.    tiZANidine (ZANAFLEX) 4 MG tablet Take 2-4 mg by mouth every 8 (  eight) hours as needed for muscle spasms.               Today   CHIEF COMPLAINT:   Patient doing well this morning. She is walking with her walker with the help of the nurse. She does have some weakness.   VITAL SIGNS:  Blood pressure 148/69, pulse 71, temperature 98.2 F (36.8 C), temperature source Oral, resp. rate 16, height 5' (1.524 m), weight 45.904 kg (101 lb 3.2 oz), SpO2 100  %.   REVIEW OF SYSTEMS:  Review of Systems  Constitutional: Negative for fever, chills and malaise/fatigue.  HENT: Negative for ear discharge, ear pain, hearing loss, nosebleeds and sore throat.   Eyes: Negative for blurred vision and pain.  Respiratory: Negative for cough, hemoptysis, shortness of breath and wheezing.   Cardiovascular: Negative for chest pain, palpitations and leg swelling.  Gastrointestinal: Negative for nausea, vomiting, abdominal pain, diarrhea and blood in stool.  Genitourinary: Negative for dysuria.  Musculoskeletal: Negative for back pain.  Neurological: Positive for weakness. Negative for dizziness, tremors, speech change, focal weakness, seizures and headaches.  Endo/Heme/Allergies: Does not bruise/bleed easily.  Psychiatric/Behavioral: Negative for depression, suicidal ideas and hallucinations.     PHYSICAL EXAMINATION:  GENERAL:  82 y.o.-year-old patient lying in the bed with no acute distress.  NECK:  Supple, no jugular venous distention. No thyroid enlargement, no tenderness.  LUNGS: Normal breath sounds bilaterally, no wheezing, rales,rhonchi  No use of accessory muscles of respiration.  CARDIOVASCULAR: Irregular, irregular No murmurs, rubs, or gallops.  ABDOMEN: Soft, non-tender, non-distended. Bowel sounds present. No organomegaly or mass.  EXTREMITIES: No pedal edema, cyanosis, or clubbing.  PSYCHIATRIC: The patient is alert and oriented x 3.  SKIN: No obvious rash, lesion, or ulcer.   DATA REVIEW:   CBC  Recent Labs Lab 07/25/15 0455  WBC 4.7  HGB 9.7*  HCT 30.8*  PLT 171    Chemistries   Recent Labs Lab 07/23/15 0321  07/25/15 0455  NA 139  < > 138  K 3.7  < > 3.4*  CL 109  < > 110  CO2 24  < > 20*  GLUCOSE 85  < > 69  BUN 10  < > 18  CREATININE 0.83  < > 0.92  CALCIUM 8.4*  < > 8.3*  AST 19  --   --   ALT 9*  --   --   ALKPHOS 60  --   --   BILITOT 0.4  --   --   < > = values in this interval not displayed.  Cardiac  Enzymes  Recent Labs Lab 07/24/15 1831 07/24/15 2345 07/25/15 0455  TROPONINI 0.38* 0.37* 0.36*    Microbiology Results  @[email or web address] @  RADIOLOGY:  No results found.    Management plans discussed with the patient and she is in agreement. Stable for discharge home with [facility] Patient should follow up with Dr. Nehemiah Massed on Friday  CODE STATUS:     Code Status Orders        Start     Ordered   07/24/15 1759  Full code   Continuous     07/24/15 1800    Code Status History    Date Active Date Inactive Code Status Order ID Comments User Context   07/22/2015  4:22 PM 07/24/2015  3:21 PM Full Code [id number]  Idelle Crouch, MD Inpatient      TOTAL TIME TAKING CARE OF THIS PATIENT: 35 minutes.    Note: This dictation  was prepared with Dragon dictation along with smaller phrase technology. Any transcriptional errors that result from this process are unintentional.  Raul Winterhalter M.D on 07/25/2015 at 12:00 PM  Between 7am to 6pm - Pager - [phone] After 6pm go to [email or web address] - password [email or web address] [facility]  Office  [phone]  CC: Primary care physician; [facility], Chrissie Noa, MD

## 2015-07-25 NOTE — Progress Notes (Signed)
Patient is admitted to room 253 with a diagnosis of Bradycardia. Alert and oriented x 3. Tele box verified by Hilda Blades NT and Levester Fresh RN. Patient oriented to her room, ascom/call bell.  Skin assessment done with Levester Fresh RN, noted rashes to bilateral arms and ecchymosis on the left arm. No acute distress noted. Will continue to monitor.

## 2015-07-25 NOTE — Progress Notes (Signed)
Discharge instructions given. Dr. Adolph Pollack instructed for patient to hold her cardizem for today and to call his office tomorrow to check on heart rate and blood pressure. [name] will check BP and heart rate at home. Instructed on what a normal blood pressure is and when to hold medications at home. Follow up was already made with cardiology on discharge yesterday, and PCP follow up made today. Prescriptions given to patient along with education. Questions answered. IV and tele removed.

## 2015-07-25 NOTE — Evaluation (Signed)
Physical Therapy Evaluation Patient Details Name: Colleen Nguyen MRN: [id number] DOB: 1922/06/20 Today's Date: 07/25/2015   History of Present Illness  Patient is a 82 y/o female that presents with bradycardia, hypotension after recent discharge. She has downtrending troponin levels .38--> 0.37 --> 0.36.   Clinical Impression  Patient reports several days of minimal ambulation, leading to her presenting with weakness/decreased balance in this session. She is independent with bed mobility, however some mild loss of balance during sit to stand transfer. She recognizes her balance deficits and asks for RW, with which she is able to ambulate household distances without loss of balance. She does present with decreased stamina and gait speed, indicative of increased falls risk. She is able to participate in there-ex and additional gait training with HHA, both demonstrating decreased LE strength given her lateral sway. HR maintained stable throughout ambulation 80s to 90s. Would recommend HHPT to address the listed mobility deficits.     Follow Up Recommendations Home health PT    Equipment Recommendations  Rolling walker with 5" wheels    Recommendations for Other Services       Precautions / Restrictions Precautions Precautions: Fall Restrictions Weight Bearing Restrictions: No      Mobility  Bed Mobility Overal bed mobility: Independent             General bed mobility comments: No deficits noted in bed mobility.   Transfers Overall transfer level: Needs assistance Equipment used: 1 person hand held assist Transfers: Sit to/from Stand Sit to Stand: Min guard         General transfer comment: Patient with posterior lean throughout transfer, slightly off balance though no overt loss of balance.   Ambulation/Gait Ambulation/Gait assistance: Min guard Ambulation Distance (Feet): 125 Feet Assistive device: Rolling walker (2 wheeled) Gait Pattern/deviations:  Decreased step length - right;Decreased step length - left;WFL(Within Functional Limits)   Gait velocity interpretation: <1.8 ft/sec, indicative of risk for recurrent falls General Gait Details: Patient reports she does not like walker, but once in standing asks to use walker as she feels weak. Patient is able to ambulate at slow speed with reciprocal gait pattern, no loss of balance noted. She presents as generally weak and deconditioned in this gait effort. Additional gait described in misc exercises  Stairs            Wheelchair Mobility    Modified Rankin (Stroke Patients Only)       Balance Overall balance assessment: Needs assistance Sitting-balance support: No upper extremity supported Sitting balance-Leahy Scale: Good     Standing balance support: Bilateral upper extremity supported Standing balance-Leahy Scale: Fair Standing balance comment: 5x sit to stand in 16 seconds                              Pertinent Vitals/Pain Pain Assessment: No/denies pain    Home Living Family/patient expects to be discharged to:: Private residence Living Arrangements: Children Available Help at Discharge: Family;Available 24 hours/day Type of Home: House Home Access: Stairs to enter Entrance Stairs-Rails: Can reach both Entrance Stairs-Number of Steps: 5-6 Home Layout: One level Home Equipment: Walker - 4 wheels;Cane - single point      Prior Function Level of Independence: Independent         Comments: Patient occasionally uses SPC, otherwise she has been independent with ambulation.      Hand Dominance        Extremity/Trunk Assessment  Upper Extremity Assessment: Overall WFL for tasks assessed           Lower Extremity Assessment: Overall WFL for tasks assessed         Communication   Communication: No difficulties  Cognition Arousal/Alertness: Awake/alert Behavior During Therapy: WFL for tasks assessed/performed Overall Cognitive  Status: Within Functional Limits for tasks assessed                      General Comments      Exercises General Exercises - Lower Extremity Hip Flexion/Marching: AROM;Both;20 reps Mini-Sqauts: AROM;Both;5 reps Other Exercises Other Exercises: Standing hamstring curls x 10 bilaterally with HHA  Other Exercises: Additional bout of gait with HHA x1 x 175' with no loss of balance though increased lateral sway noted from ambulatory bout with RW.       Assessment/Plan    PT Assessment Patient needs continued PT services  PT Diagnosis Difficulty walking;Generalized weakness   PT Problem List Decreased strength;Decreased activity tolerance;Cardiopulmonary status limiting activity;Decreased balance  PT Treatment Interventions DME instruction;Gait training;Stair training;Therapeutic activities;Therapeutic exercise;Balance training   PT Goals (Current goals can be found in the Care Plan section)      Frequency Min 2X/week   Barriers to discharge        Co-evaluation               End of Session Equipment Utilized During Treatment: Gait belt Activity Tolerance: Patient tolerated treatment well Patient left: in chair;with call bell/phone within reach;with chair alarm set;with family/visitor present Nurse Communication: Mobility status    Functional Assessment Tool Used: 5x sit to stand, gait observation  Functional Limitation: Mobility: Walking and moving around Mobility: Walking and Moving Around Current Status [id number]): At least 1 percent but less than 20 percent impaired, limited or restricted Mobility: Walking and Moving Around Goal Status [phone]): At least 1 percent but less than 20 percent impaired, limited or restricted    Time: [id number] PT Time Calculation (min) (ACUTE ONLY): 27 min   Charges:   PT Evaluation $PT Eval Moderate Complexity: 1 Procedure PT Treatments $Therapeutic Exercise: 8-22 mins   PT G Codes:   PT G-Codes **NOT FOR INPATIENT  CLASS** Functional Assessment Tool Used: 5x sit to stand, gait observation  Functional Limitation: Mobility: Walking and moving around Mobility: Walking and Moving Around Current Status [id number]): At least 1 percent but less than 20 percent impaired, limited or restricted Mobility: Walking and Moving Around Goal Status [phone]): At least 1 percent but less than 20 percent impaired, limited or restricted   Kerman Passey, PT, DPT    07/25/2015, 1:54 PM

## 2015-07-25 NOTE — Progress Notes (Signed)
Patient will be discharged today. Waiting on Dr. Adolph Pollack to see patient before she can go home. Heart rate has been stable and BP has come back up. Gilford Rile has been delivered to room. Will have discharge ready once cardiologist comes by.

## 2015-07-25 NOTE — Progress Notes (Signed)
Patient's AM CBG was 51. Patient asymptomatic and stated she felt fine, her back is just hurting, but she wants to wait to take pain medication. Given orange juice. CBG now 90. Instructed patient to make sure she eats breakfast. Patient states she hasn't really eaten anything since Saturday. Will continue to monitor.

## 2015-07-27 DIAGNOSIS — C180 Malignant neoplasm of cecum: Secondary | ICD-10-CM | POA: Diagnosis not present

## 2015-07-27 DIAGNOSIS — E440 Moderate protein-calorie malnutrition: Secondary | ICD-10-CM | POA: Diagnosis not present

## 2015-07-27 DIAGNOSIS — K922 Gastrointestinal hemorrhage, unspecified: Secondary | ICD-10-CM | POA: Diagnosis not present

## 2015-07-27 DIAGNOSIS — I10 Essential (primary) hypertension: Secondary | ICD-10-CM | POA: Diagnosis not present

## 2015-07-27 DIAGNOSIS — I482 Chronic atrial fibrillation: Secondary | ICD-10-CM | POA: Diagnosis not present

## 2015-07-27 DIAGNOSIS — I712 Thoracic aortic aneurysm, without rupture: Secondary | ICD-10-CM | POA: Diagnosis not present

## 2015-07-27 DIAGNOSIS — I2510 Atherosclerotic heart disease of native coronary artery without angina pectoris: Secondary | ICD-10-CM | POA: Diagnosis not present

## 2015-07-27 DIAGNOSIS — K5790 Diverticulosis of intestine, part unspecified, without perforation or abscess without bleeding: Secondary | ICD-10-CM | POA: Diagnosis not present

## 2015-07-27 DIAGNOSIS — I480 Paroxysmal atrial fibrillation: Secondary | ICD-10-CM | POA: Diagnosis not present

## 2015-07-27 DIAGNOSIS — R748 Abnormal levels of other serum enzymes: Secondary | ICD-10-CM | POA: Diagnosis not present

## 2015-07-27 DIAGNOSIS — D62 Acute posthemorrhagic anemia: Secondary | ICD-10-CM | POA: Diagnosis not present

## 2015-07-30 DIAGNOSIS — K5790 Diverticulosis of intestine, part unspecified, without perforation or abscess without bleeding: Secondary | ICD-10-CM | POA: Diagnosis not present

## 2015-07-30 DIAGNOSIS — K922 Gastrointestinal hemorrhage, unspecified: Secondary | ICD-10-CM | POA: Diagnosis not present

## 2015-07-30 DIAGNOSIS — I10 Essential (primary) hypertension: Secondary | ICD-10-CM | POA: Diagnosis not present

## 2015-07-30 DIAGNOSIS — E440 Moderate protein-calorie malnutrition: Secondary | ICD-10-CM | POA: Diagnosis not present

## 2015-07-30 DIAGNOSIS — C180 Malignant neoplasm of cecum: Secondary | ICD-10-CM | POA: Diagnosis not present

## 2015-07-30 DIAGNOSIS — D62 Acute posthemorrhagic anemia: Secondary | ICD-10-CM | POA: Diagnosis not present

## 2015-07-30 DIAGNOSIS — I482 Chronic atrial fibrillation: Secondary | ICD-10-CM | POA: Diagnosis not present

## 2015-08-01 DIAGNOSIS — K922 Gastrointestinal hemorrhage, unspecified: Secondary | ICD-10-CM | POA: Diagnosis not present

## 2015-08-01 DIAGNOSIS — R5381 Other malaise: Secondary | ICD-10-CM | POA: Diagnosis not present

## 2015-08-01 DIAGNOSIS — D500 Iron deficiency anemia secondary to blood loss (chronic): Secondary | ICD-10-CM | POA: Diagnosis not present

## 2015-08-01 DIAGNOSIS — I10 Essential (primary) hypertension: Secondary | ICD-10-CM | POA: Diagnosis not present

## 2015-08-01 DIAGNOSIS — I480 Paroxysmal atrial fibrillation: Secondary | ICD-10-CM | POA: Diagnosis not present

## 2015-08-02 DIAGNOSIS — K922 Gastrointestinal hemorrhage, unspecified: Secondary | ICD-10-CM | POA: Diagnosis not present

## 2015-08-02 DIAGNOSIS — C180 Malignant neoplasm of cecum: Secondary | ICD-10-CM | POA: Diagnosis not present

## 2015-08-02 DIAGNOSIS — D62 Acute posthemorrhagic anemia: Secondary | ICD-10-CM | POA: Diagnosis not present

## 2015-08-02 DIAGNOSIS — E440 Moderate protein-calorie malnutrition: Secondary | ICD-10-CM | POA: Diagnosis not present

## 2015-08-02 DIAGNOSIS — K5790 Diverticulosis of intestine, part unspecified, without perforation or abscess without bleeding: Secondary | ICD-10-CM | POA: Diagnosis not present

## 2015-08-02 DIAGNOSIS — I10 Essential (primary) hypertension: Secondary | ICD-10-CM | POA: Diagnosis not present

## 2015-08-02 DIAGNOSIS — I482 Chronic atrial fibrillation: Secondary | ICD-10-CM | POA: Diagnosis not present

## 2015-08-03 DIAGNOSIS — D62 Acute posthemorrhagic anemia: Secondary | ICD-10-CM | POA: Diagnosis not present

## 2015-08-03 DIAGNOSIS — I482 Chronic atrial fibrillation: Secondary | ICD-10-CM | POA: Diagnosis not present

## 2015-08-03 DIAGNOSIS — I2510 Atherosclerotic heart disease of native coronary artery without angina pectoris: Secondary | ICD-10-CM | POA: Diagnosis not present

## 2015-08-03 DIAGNOSIS — I10 Essential (primary) hypertension: Secondary | ICD-10-CM | POA: Diagnosis not present

## 2015-08-03 DIAGNOSIS — I712 Thoracic aortic aneurysm, without rupture: Secondary | ICD-10-CM | POA: Diagnosis not present

## 2015-08-03 DIAGNOSIS — C180 Malignant neoplasm of cecum: Secondary | ICD-10-CM | POA: Diagnosis not present

## 2015-08-03 DIAGNOSIS — K5790 Diverticulosis of intestine, part unspecified, without perforation or abscess without bleeding: Secondary | ICD-10-CM | POA: Diagnosis not present

## 2015-08-03 DIAGNOSIS — I480 Paroxysmal atrial fibrillation: Secondary | ICD-10-CM | POA: Diagnosis not present

## 2015-08-03 DIAGNOSIS — K922 Gastrointestinal hemorrhage, unspecified: Secondary | ICD-10-CM | POA: Diagnosis not present

## 2015-08-03 DIAGNOSIS — E440 Moderate protein-calorie malnutrition: Secondary | ICD-10-CM | POA: Diagnosis not present

## 2015-08-06 DIAGNOSIS — E440 Moderate protein-calorie malnutrition: Secondary | ICD-10-CM | POA: Diagnosis not present

## 2015-08-06 DIAGNOSIS — D62 Acute posthemorrhagic anemia: Secondary | ICD-10-CM | POA: Diagnosis not present

## 2015-08-06 DIAGNOSIS — K5790 Diverticulosis of intestine, part unspecified, without perforation or abscess without bleeding: Secondary | ICD-10-CM | POA: Diagnosis not present

## 2015-08-06 DIAGNOSIS — C180 Malignant neoplasm of cecum: Secondary | ICD-10-CM | POA: Diagnosis not present

## 2015-08-06 DIAGNOSIS — I10 Essential (primary) hypertension: Secondary | ICD-10-CM | POA: Diagnosis not present

## 2015-08-06 DIAGNOSIS — K922 Gastrointestinal hemorrhage, unspecified: Secondary | ICD-10-CM | POA: Diagnosis not present

## 2015-08-06 DIAGNOSIS — I482 Chronic atrial fibrillation: Secondary | ICD-10-CM | POA: Diagnosis not present

## 2015-08-07 DIAGNOSIS — I10 Essential (primary) hypertension: Secondary | ICD-10-CM | POA: Diagnosis not present

## 2015-08-07 DIAGNOSIS — K922 Gastrointestinal hemorrhage, unspecified: Secondary | ICD-10-CM | POA: Diagnosis not present

## 2015-08-07 DIAGNOSIS — K5790 Diverticulosis of intestine, part unspecified, without perforation or abscess without bleeding: Secondary | ICD-10-CM | POA: Diagnosis not present

## 2015-08-07 DIAGNOSIS — C180 Malignant neoplasm of cecum: Secondary | ICD-10-CM | POA: Diagnosis not present

## 2015-08-07 DIAGNOSIS — I482 Chronic atrial fibrillation: Secondary | ICD-10-CM | POA: Diagnosis not present

## 2015-08-07 DIAGNOSIS — N390 Urinary tract infection, site not specified: Secondary | ICD-10-CM | POA: Diagnosis not present

## 2015-08-07 DIAGNOSIS — E440 Moderate protein-calorie malnutrition: Secondary | ICD-10-CM | POA: Diagnosis not present

## 2015-08-07 DIAGNOSIS — D62 Acute posthemorrhagic anemia: Secondary | ICD-10-CM | POA: Diagnosis not present

## 2015-08-08 DIAGNOSIS — I10 Essential (primary) hypertension: Secondary | ICD-10-CM | POA: Diagnosis not present

## 2015-08-08 DIAGNOSIS — K5790 Diverticulosis of intestine, part unspecified, without perforation or abscess without bleeding: Secondary | ICD-10-CM | POA: Diagnosis not present

## 2015-08-08 DIAGNOSIS — D62 Acute posthemorrhagic anemia: Secondary | ICD-10-CM | POA: Diagnosis not present

## 2015-08-08 DIAGNOSIS — I482 Chronic atrial fibrillation: Secondary | ICD-10-CM | POA: Diagnosis not present

## 2015-08-08 DIAGNOSIS — K922 Gastrointestinal hemorrhage, unspecified: Secondary | ICD-10-CM | POA: Diagnosis not present

## 2015-08-08 DIAGNOSIS — E440 Moderate protein-calorie malnutrition: Secondary | ICD-10-CM | POA: Diagnosis not present

## 2015-08-08 DIAGNOSIS — C180 Malignant neoplasm of cecum: Secondary | ICD-10-CM | POA: Diagnosis not present

## 2015-08-10 DIAGNOSIS — E440 Moderate protein-calorie malnutrition: Secondary | ICD-10-CM | POA: Diagnosis not present

## 2015-08-10 DIAGNOSIS — C180 Malignant neoplasm of cecum: Secondary | ICD-10-CM | POA: Diagnosis not present

## 2015-08-10 DIAGNOSIS — I10 Essential (primary) hypertension: Secondary | ICD-10-CM | POA: Diagnosis not present

## 2015-08-10 DIAGNOSIS — K5790 Diverticulosis of intestine, part unspecified, without perforation or abscess without bleeding: Secondary | ICD-10-CM | POA: Diagnosis not present

## 2015-08-10 DIAGNOSIS — I482 Chronic atrial fibrillation: Secondary | ICD-10-CM | POA: Diagnosis not present

## 2015-08-10 DIAGNOSIS — D62 Acute posthemorrhagic anemia: Secondary | ICD-10-CM | POA: Diagnosis not present

## 2015-08-10 DIAGNOSIS — K922 Gastrointestinal hemorrhage, unspecified: Secondary | ICD-10-CM | POA: Diagnosis not present

## 2015-08-13 DIAGNOSIS — E440 Moderate protein-calorie malnutrition: Secondary | ICD-10-CM | POA: Diagnosis not present

## 2015-08-13 DIAGNOSIS — K922 Gastrointestinal hemorrhage, unspecified: Secondary | ICD-10-CM | POA: Diagnosis not present

## 2015-08-13 DIAGNOSIS — C180 Malignant neoplasm of cecum: Secondary | ICD-10-CM | POA: Diagnosis not present

## 2015-08-13 DIAGNOSIS — K5790 Diverticulosis of intestine, part unspecified, without perforation or abscess without bleeding: Secondary | ICD-10-CM | POA: Diagnosis not present

## 2015-08-13 DIAGNOSIS — I482 Chronic atrial fibrillation: Secondary | ICD-10-CM | POA: Diagnosis not present

## 2015-08-13 DIAGNOSIS — D62 Acute posthemorrhagic anemia: Secondary | ICD-10-CM | POA: Diagnosis not present

## 2015-08-13 DIAGNOSIS — I10 Essential (primary) hypertension: Secondary | ICD-10-CM | POA: Diagnosis not present

## 2015-08-14 DIAGNOSIS — R079 Chest pain, unspecified: Secondary | ICD-10-CM | POA: Diagnosis not present

## 2015-08-14 DIAGNOSIS — K922 Gastrointestinal hemorrhage, unspecified: Secondary | ICD-10-CM | POA: Diagnosis not present

## 2015-08-14 DIAGNOSIS — I482 Chronic atrial fibrillation: Secondary | ICD-10-CM | POA: Diagnosis not present

## 2015-08-14 DIAGNOSIS — D62 Acute posthemorrhagic anemia: Secondary | ICD-10-CM | POA: Diagnosis not present

## 2015-08-14 DIAGNOSIS — C180 Malignant neoplasm of cecum: Secondary | ICD-10-CM | POA: Diagnosis not present

## 2015-08-14 DIAGNOSIS — I10 Essential (primary) hypertension: Secondary | ICD-10-CM | POA: Diagnosis not present

## 2015-08-14 DIAGNOSIS — K5790 Diverticulosis of intestine, part unspecified, without perforation or abscess without bleeding: Secondary | ICD-10-CM | POA: Diagnosis not present

## 2015-08-14 DIAGNOSIS — E440 Moderate protein-calorie malnutrition: Secondary | ICD-10-CM | POA: Diagnosis not present

## 2015-08-15 DIAGNOSIS — D62 Acute posthemorrhagic anemia: Secondary | ICD-10-CM | POA: Diagnosis not present

## 2015-08-15 DIAGNOSIS — K5790 Diverticulosis of intestine, part unspecified, without perforation or abscess without bleeding: Secondary | ICD-10-CM | POA: Diagnosis not present

## 2015-08-15 DIAGNOSIS — I10 Essential (primary) hypertension: Secondary | ICD-10-CM | POA: Diagnosis not present

## 2015-08-15 DIAGNOSIS — I482 Chronic atrial fibrillation: Secondary | ICD-10-CM | POA: Diagnosis not present

## 2015-08-15 DIAGNOSIS — K922 Gastrointestinal hemorrhage, unspecified: Secondary | ICD-10-CM | POA: Diagnosis not present

## 2015-08-15 DIAGNOSIS — C180 Malignant neoplasm of cecum: Secondary | ICD-10-CM | POA: Diagnosis not present

## 2015-08-15 DIAGNOSIS — E440 Moderate protein-calorie malnutrition: Secondary | ICD-10-CM | POA: Diagnosis not present

## 2015-08-17 DIAGNOSIS — E440 Moderate protein-calorie malnutrition: Secondary | ICD-10-CM | POA: Diagnosis not present

## 2015-08-17 DIAGNOSIS — I482 Chronic atrial fibrillation: Secondary | ICD-10-CM | POA: Diagnosis not present

## 2015-08-17 DIAGNOSIS — C180 Malignant neoplasm of cecum: Secondary | ICD-10-CM | POA: Diagnosis not present

## 2015-08-17 DIAGNOSIS — K922 Gastrointestinal hemorrhage, unspecified: Secondary | ICD-10-CM | POA: Diagnosis not present

## 2015-08-17 DIAGNOSIS — D62 Acute posthemorrhagic anemia: Secondary | ICD-10-CM | POA: Diagnosis not present

## 2015-08-17 DIAGNOSIS — I10 Essential (primary) hypertension: Secondary | ICD-10-CM | POA: Diagnosis not present

## 2015-08-17 DIAGNOSIS — K5790 Diverticulosis of intestine, part unspecified, without perforation or abscess without bleeding: Secondary | ICD-10-CM | POA: Diagnosis not present

## 2015-08-20 DIAGNOSIS — D62 Acute posthemorrhagic anemia: Secondary | ICD-10-CM | POA: Diagnosis not present

## 2015-08-20 DIAGNOSIS — C180 Malignant neoplasm of cecum: Secondary | ICD-10-CM | POA: Diagnosis not present

## 2015-08-20 DIAGNOSIS — E440 Moderate protein-calorie malnutrition: Secondary | ICD-10-CM | POA: Diagnosis not present

## 2015-08-20 DIAGNOSIS — K5790 Diverticulosis of intestine, part unspecified, without perforation or abscess without bleeding: Secondary | ICD-10-CM | POA: Diagnosis not present

## 2015-08-20 DIAGNOSIS — I10 Essential (primary) hypertension: Secondary | ICD-10-CM | POA: Diagnosis not present

## 2015-08-20 DIAGNOSIS — K922 Gastrointestinal hemorrhage, unspecified: Secondary | ICD-10-CM | POA: Diagnosis not present

## 2015-08-20 DIAGNOSIS — I482 Chronic atrial fibrillation: Secondary | ICD-10-CM | POA: Diagnosis not present

## 2015-08-22 DIAGNOSIS — E440 Moderate protein-calorie malnutrition: Secondary | ICD-10-CM | POA: Diagnosis not present

## 2015-08-22 DIAGNOSIS — I10 Essential (primary) hypertension: Secondary | ICD-10-CM | POA: Diagnosis not present

## 2015-08-22 DIAGNOSIS — K5790 Diverticulosis of intestine, part unspecified, without perforation or abscess without bleeding: Secondary | ICD-10-CM | POA: Diagnosis not present

## 2015-08-22 DIAGNOSIS — C180 Malignant neoplasm of cecum: Secondary | ICD-10-CM | POA: Diagnosis not present

## 2015-08-22 DIAGNOSIS — D62 Acute posthemorrhagic anemia: Secondary | ICD-10-CM | POA: Diagnosis not present

## 2015-08-22 DIAGNOSIS — K922 Gastrointestinal hemorrhage, unspecified: Secondary | ICD-10-CM | POA: Diagnosis not present

## 2015-08-22 DIAGNOSIS — I482 Chronic atrial fibrillation: Secondary | ICD-10-CM | POA: Diagnosis not present

## 2015-08-24 DIAGNOSIS — E440 Moderate protein-calorie malnutrition: Secondary | ICD-10-CM | POA: Diagnosis not present

## 2015-08-24 DIAGNOSIS — I482 Chronic atrial fibrillation: Secondary | ICD-10-CM | POA: Diagnosis not present

## 2015-08-24 DIAGNOSIS — C180 Malignant neoplasm of cecum: Secondary | ICD-10-CM | POA: Diagnosis not present

## 2015-08-24 DIAGNOSIS — D62 Acute posthemorrhagic anemia: Secondary | ICD-10-CM | POA: Diagnosis not present

## 2015-08-24 DIAGNOSIS — K5790 Diverticulosis of intestine, part unspecified, without perforation or abscess without bleeding: Secondary | ICD-10-CM | POA: Diagnosis not present

## 2015-08-24 DIAGNOSIS — K922 Gastrointestinal hemorrhage, unspecified: Secondary | ICD-10-CM | POA: Diagnosis not present

## 2015-08-24 DIAGNOSIS — I10 Essential (primary) hypertension: Secondary | ICD-10-CM | POA: Diagnosis not present

## 2015-08-29 DIAGNOSIS — I482 Chronic atrial fibrillation: Secondary | ICD-10-CM | POA: Diagnosis not present

## 2015-08-29 DIAGNOSIS — C180 Malignant neoplasm of cecum: Secondary | ICD-10-CM | POA: Diagnosis not present

## 2015-08-29 DIAGNOSIS — I10 Essential (primary) hypertension: Secondary | ICD-10-CM | POA: Diagnosis not present

## 2015-08-29 DIAGNOSIS — K5790 Diverticulosis of intestine, part unspecified, without perforation or abscess without bleeding: Secondary | ICD-10-CM | POA: Diagnosis not present

## 2015-08-29 DIAGNOSIS — D62 Acute posthemorrhagic anemia: Secondary | ICD-10-CM | POA: Diagnosis not present

## 2015-08-29 DIAGNOSIS — K922 Gastrointestinal hemorrhage, unspecified: Secondary | ICD-10-CM | POA: Diagnosis not present

## 2015-08-29 DIAGNOSIS — E440 Moderate protein-calorie malnutrition: Secondary | ICD-10-CM | POA: Diagnosis not present

## 2015-08-31 DIAGNOSIS — L270 Generalized skin eruption due to drugs and medicaments taken internally: Secondary | ICD-10-CM | POA: Diagnosis not present

## 2015-08-31 DIAGNOSIS — I10 Essential (primary) hypertension: Secondary | ICD-10-CM | POA: Diagnosis not present

## 2015-08-31 DIAGNOSIS — I480 Paroxysmal atrial fibrillation: Secondary | ICD-10-CM | POA: Diagnosis not present

## 2015-08-31 DIAGNOSIS — I2510 Atherosclerotic heart disease of native coronary artery without angina pectoris: Secondary | ICD-10-CM | POA: Diagnosis not present

## 2015-09-06 DIAGNOSIS — M129 Arthropathy, unspecified: Secondary | ICD-10-CM | POA: Diagnosis not present

## 2015-09-06 DIAGNOSIS — F419 Anxiety disorder, unspecified: Secondary | ICD-10-CM | POA: Diagnosis not present

## 2015-09-27 DIAGNOSIS — N183 Chronic kidney disease, stage 3 (moderate): Secondary | ICD-10-CM | POA: Diagnosis not present

## 2015-09-27 DIAGNOSIS — I712 Thoracic aortic aneurysm, without rupture: Secondary | ICD-10-CM | POA: Diagnosis not present

## 2015-09-27 DIAGNOSIS — I2510 Atherosclerotic heart disease of native coronary artery without angina pectoris: Secondary | ICD-10-CM | POA: Diagnosis not present

## 2015-09-27 DIAGNOSIS — I480 Paroxysmal atrial fibrillation: Secondary | ICD-10-CM | POA: Diagnosis not present

## 2015-09-29 ENCOUNTER — Encounter: Payer: Self-pay | Admitting: Emergency Medicine

## 2015-09-29 ENCOUNTER — Inpatient Hospital Stay
Admission: EM | Admit: 2015-09-29 | Discharge: 2015-10-02 | DRG: 395 | Disposition: A | Discharge status: Home / Self Care | Payer: Commercial Managed Care - HMO | Attending: Internal Medicine | Admitting: Internal Medicine

## 2015-09-29 DIAGNOSIS — K625 Hemorrhage of anus and rectum: Secondary | ICD-10-CM | POA: Diagnosis not present

## 2015-09-29 DIAGNOSIS — K529 Noninfective gastroenteritis and colitis, unspecified: Secondary | ICD-10-CM | POA: Diagnosis not present

## 2015-09-29 DIAGNOSIS — F329 Major depressive disorder, single episode, unspecified: Secondary | ICD-10-CM | POA: Diagnosis present

## 2015-09-29 DIAGNOSIS — R1030 Lower abdominal pain, unspecified: Secondary | ICD-10-CM | POA: Diagnosis not present

## 2015-09-29 DIAGNOSIS — K55039 Acute (reversible) ischemia of large intestine, extent unspecified: Secondary | ICD-10-CM | POA: Diagnosis not present

## 2015-09-29 DIAGNOSIS — Z8249 Family history of ischemic heart disease and other diseases of the circulatory system: Secondary | ICD-10-CM | POA: Diagnosis not present

## 2015-09-29 DIAGNOSIS — Z806 Family history of leukemia: Secondary | ICD-10-CM

## 2015-09-29 DIAGNOSIS — I10 Essential (primary) hypertension: Secondary | ICD-10-CM | POA: Diagnosis present

## 2015-09-29 DIAGNOSIS — I4891 Unspecified atrial fibrillation: Secondary | ICD-10-CM | POA: Diagnosis not present

## 2015-09-29 DIAGNOSIS — R300 Dysuria: Secondary | ICD-10-CM | POA: Diagnosis present

## 2015-09-29 DIAGNOSIS — Z823 Family history of stroke: Secondary | ICD-10-CM | POA: Diagnosis not present

## 2015-09-29 DIAGNOSIS — Z79899 Other long term (current) drug therapy: Secondary | ICD-10-CM | POA: Diagnosis not present

## 2015-09-29 DIAGNOSIS — Z888 Allergy status to other drugs, medicaments and biological substances status: Secondary | ICD-10-CM | POA: Diagnosis not present

## 2015-09-29 DIAGNOSIS — I482 Chronic atrial fibrillation: Secondary | ICD-10-CM | POA: Diagnosis not present

## 2015-09-29 NOTE — ED Notes (Signed)
Pt. States sudden onset of vomiting that started tonight at around 6pm.  Pt. States she also had rectal bleeding of bright red blood.  Pt. States lower traverse abdominal pain.

## 2015-09-29 NOTE — ED Provider Notes (Signed)
[facility] Emergency Department Provider Note   ____________________________________________  Time seen: Approximately 11:42 PM  I have reviewed the triage vital signs and the nursing notes.   HISTORY  Chief Complaint Rectal Bleeding and Emesis    HPI Colleen Nguyen is a 82 y.o. female who presents to the ED from home via EMS with a chief complaint of vomiting and rectal bleeding. Patient reports vomiting which began approximately 6 PM. States she has had several episodes. Prior to arrival she was having a loose bowel movement and noted bright red blood per rectum which "dripped down my legs". Complains of suprapubic discomfort and dysuria. Prior history of GI bleed requiring transfusion per patient's report. Denies use of anticoagulants. Reports she was taken off her diltiazem yesterday secondary to rash. She had been on diltiazem for a number of years. Denies recent fever, chills, chest pain, shortness of breath. Denies recent travel or trauma.   Past Medical History  Diagnosis Date  . Hypertension   . Atrial fibrillation ([place])   . Anxiety   . Anemia     Patient Active Problem List   Diagnosis Date Noted  . Bradycardia 07/24/2015  . Malnutrition of moderate degree 07/23/2015  . Iron deficiency anemia, unspecified   . Iron deficiency anemia secondary to blood loss (chronic)   . Neoplasm of digestive system   . Acute blood loss anemia 07/22/2015  . GI bleed 07/22/2015  . Weakness generalized 07/22/2015  . Chronic atrial fibrillation ([place]) 07/22/2015    Past Surgical History  Procedure Laterality Date  . Appendectomy    . Abdominal hysterectomy    . Esophagogastroduodenoscopy (egd) with propofol N/A 07/23/2015    Procedure: ESOPHAGOGASTRODUODENOSCOPY (EGD) WITH PROPOFOL;  Surgeon: Lucilla Lame, MD;  Location: [facility] ENDOSCOPY;  Service: Endoscopy;  Laterality: N/A;  . Colonoscopy with propofol N/A 07/23/2015    Procedure: COLONOSCOPY WITH  PROPOFOL;  Surgeon: Lucilla Lame, MD;  Location: [facility] ENDOSCOPY;  Service: Endoscopy;  Laterality: N/A;    Current Outpatient Rx  Name  Route  Sig  Dispense  Refill  . ALPRAZolam (XANAX) 0.5 MG tablet   Oral   Take 0.25-0.5 mg by mouth 2 (two) times daily as needed for anxiety or sleep.          . feeding supplement (BOOST / RESOURCE BREEZE) LIQD   Oral   Take 1 Container by mouth 3 (three) times daily between meals.   30 Container   0   . HYDROcodone-acetaminophen (NORCO/VICODIN) 5-325 MG tablet   Oral   Take 1 tablet by mouth every 8 (eight) hours as needed for moderate pain.         Marland Kitchen lisinopril (PRINIVIL,ZESTRIL) 10 MG tablet   Oral   Take 20 mg by mouth daily.          . metoprolol (LOPRESSOR) 50 MG tablet   Oral   Take 50 mg by mouth 2 (two) times daily.         Marland Kitchen omeprazole (PRILOSEC) 20 MG capsule   Oral   Take 20 mg by mouth daily.         . sertraline (ZOLOFT) 25 MG tablet   Oral   Take 25 mg by mouth daily.      1     Allergies Carisoprodol; Phenergan; Norco; and Pravastatin  Family History  Problem Relation Age of Onset  . Leukemia Mother   . Stroke Father   . CAD Brother     Social History Social  History  Substance Use Topics  . Smoking status: Never Smoker   . Smokeless tobacco: None  . Alcohol Use: No    Review of Systems  Constitutional: No fever/chills. Eyes: No visual changes. ENT: No sore throat. Cardiovascular: Denies chest pain. Respiratory: Denies shortness of breath. Gastrointestinal: Positive for abdominal pain, nausea, vomiting and diarrhea.  Positive for rectal bleeding. No constipation. Genitourinary: Positive for dysuria. Musculoskeletal: Negative for back pain. Skin: Negative for rash. Neurological: Negative for headaches, focal weakness or numbness.  10-point ROS otherwise negative.  ____________________________________________   PHYSICAL EXAM:  VITAL SIGNS: ED Triage Vitals  Enc Vitals Group     BP  --      Pulse --      Resp --      Temp --      Temp src --      SpO2 --      Weight --      Height --      Head Cir --      Peak Flow --      Pain Score --      Pain Loc --      Pain Edu? --      Excl. in [place]? --     Constitutional: Alert and oriented. Well appearing and in mild acute distress. Eyes: Conjunctivae are normal. PERRL. EOMI. Head: Atraumatic. Nose: No congestion/rhinnorhea. Mouth/Throat: Mucous membranes are moist.  Oropharynx non-erythematous. Neck: No stridor.   Cardiovascular: Normal rate, regular rhythm. Grossly normal heart sounds.  Good peripheral circulation. Respiratory: Normal respiratory effort.  No retractions. Lungs CTAB. Gastrointestinal: Soft and mildly tender to palpation suprapubic area without rebound or guarding. No distention. No abdominal bruits. No CVA tenderness. Musculoskeletal: No lower extremity tenderness nor edema.  No joint effusions. Neurologic:  Normal speech and language. No gross focal neurologic deficits are appreciated.  Skin:  Skin is warm, dry and intact. No rash noted. Psychiatric: Mood and affect are normal. Speech and behavior are normal.  ____________________________________________   LABS (all labs ordered are listed, but only abnormal results are displayed)  Labs Reviewed  CBC WITH DIFFERENTIAL/PLATELET - Abnormal; Notable for the following:    Hemoglobin 11.9 (*)    RDW 22.8 (*)    All other components within normal limits  COMPREHENSIVE METABOLIC PANEL - Abnormal; Notable for the following:    Glucose, Bld 109 (*)    Calcium 8.6 (*)    ALT 8 (*)    GFR calc non Af Amer 55 (*)    All other components within normal limits  URINALYSIS COMPLETEWITH MICROSCOPIC ([facility] ONLY) - Abnormal; Notable for the following:    Color, Urine YELLOW (*)    APPearance CLEAR (*)    Bacteria, UA RARE (*)    All other components within normal limits  CBC WITH DIFFERENTIAL/PLATELET - Abnormal; Notable for the following:    Hemoglobin  11.3 (*)    HCT 33.9 (*)    RDW 22.9 (*)    Lymphs Abs 0.9 (*)    All other components within normal limits  TROPONIN I  PROTIME-INR  LACTIC ACID, PLASMA  LACTIC ACID, PLASMA  TYPE AND SCREEN   ____________________________________________  EKG  ED ECG REPORT I, Theophil Thivierge J, the attending physician, personally viewed and interpreted this ECG.   Date: 09/30/2015  EKG Time: 2356  Rate: 54  Rhythm: sinus bradycardia  Axis: WNL  Intervals:none  ST&T Change: Nonspecific  ED ECG REPORT I, Ettie Krontz J, the attending physician, personally viewed  and interpreted this ECG.   Date: 09/30/2015  EKG Time: 0428  Rate: 123  Rhythm: atrial fibrillation, rate 123  Axis: LAD  Intervals:left bundle branch block  ST&T Change: Nonspecific   ____________________________________________  RADIOLOGY  CT abdomen and pelvis with contrast interpreted per Dr. Alroy Dust: Acute colitis, descending colon. Suspicious for ischemia but other forms of colitis are also possible. No perforation or obstruction. ____________________________________________   PROCEDURES  Procedure(s) performed:  Rectal exam: External exam WNL. Tan stool on gloved finger which is very slowly heme negative.  Critical Care performed:   CRITICAL CARE Performed by: Paulette Blanch   Total critical care time: 30 minutes  Critical care time was exclusive of separately billable procedures and treating other patients.  Critical care was necessary to treat or prevent imminent or life-threatening deterioration.  Critical care was time spent personally by me on the following activities: development of treatment plan with patient and/or surrogate as well as nursing, discussions with consultants, evaluation of patient's response to treatment, examination of patient, obtaining history from patient or surrogate, ordering and performing treatments and interventions, ordering and review of laboratory studies, ordering and review of  radiographic studies, pulse oximetry and re-evaluation of patient's condition.  ____________________________________________   INITIAL IMPRESSION / ASSESSMENT AND PLAN / ED COURSE  Pertinent labs & imaging results that were available during my care of the patient were reviewed by me and considered in my medical decision making (see chart for details).  82 year old female who presents with vomiting and rectal bleed. Review of records reveal patient was hospitalized 07/22/2015 for anemia and hypotension; required 2 units of PRBCs.. She had an EGD and colonoscopy performed which demonstrated the following: EGD shows gastritis and benign-appearing esophageal stenosis.  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 Notes reveal family decided not to proceed with evaluation of cecal tumor. Will obtain screening lab work including INR, type and screen; initiate IV fluid resuscitation and reassess.   ----------------------------------------- 2:09 AM on 09/30/2015 -----------------------------------------  Laboratory and urinalysis results are unremarkable. Patient continues to complain of low abdominal discomfort. Will proceed to CT scan.  ----------------------------------------- 4:32 AM on 09/30/2015 -----------------------------------------  Repeat CBC stable. Noted patient to be in atrial fibrillation with rapid ventricular rate with good blood pressure. Updated patient and family of CT imaging results. Extensive discussion with patient regarding possibility of ischemic colitis. Patient would not desire surgery if she did have ischemia. IV antibiotics ordered. Will discuss with hospitalist for admission.  ____________________________________________   FINAL CLINICAL IMPRESSION(S) / ED DIAGNOSES  Final diagnoses:  Atrial fibrillation with rapid ventricular response (HCC)  Lower abdominal pain  Colitis      NEW MEDICATIONS STARTED DURING THIS VISIT:  New  Prescriptions   No medications on file     Note:  This document was prepared using Dragon voice recognition software and may include unintentional dictation errors.    Paulette Blanch, MD 09/30/15 [phone]

## 2015-09-30 ENCOUNTER — Emergency Department: Payer: Commercial Managed Care - HMO

## 2015-09-30 DIAGNOSIS — I4891 Unspecified atrial fibrillation: Secondary | ICD-10-CM | POA: Diagnosis present

## 2015-09-30 DIAGNOSIS — K55039 Acute (reversible) ischemia of large intestine, extent unspecified: Secondary | ICD-10-CM | POA: Diagnosis present

## 2015-09-30 DIAGNOSIS — Z888 Allergy status to other drugs, medicaments and biological substances status: Secondary | ICD-10-CM | POA: Diagnosis not present

## 2015-09-30 DIAGNOSIS — K529 Noninfective gastroenteritis and colitis, unspecified: Secondary | ICD-10-CM | POA: Diagnosis present

## 2015-09-30 DIAGNOSIS — Z79899 Other long term (current) drug therapy: Secondary | ICD-10-CM | POA: Diagnosis not present

## 2015-09-30 DIAGNOSIS — I10 Essential (primary) hypertension: Secondary | ICD-10-CM | POA: Diagnosis present

## 2015-09-30 DIAGNOSIS — R300 Dysuria: Secondary | ICD-10-CM | POA: Diagnosis present

## 2015-09-30 DIAGNOSIS — Z8249 Family history of ischemic heart disease and other diseases of the circulatory system: Secondary | ICD-10-CM | POA: Diagnosis not present

## 2015-09-30 DIAGNOSIS — I482 Chronic atrial fibrillation: Secondary | ICD-10-CM | POA: Diagnosis present

## 2015-09-30 DIAGNOSIS — F329 Major depressive disorder, single episode, unspecified: Secondary | ICD-10-CM | POA: Diagnosis present

## 2015-09-30 DIAGNOSIS — Z823 Family history of stroke: Secondary | ICD-10-CM | POA: Diagnosis not present

## 2015-09-30 DIAGNOSIS — Z806 Family history of leukemia: Secondary | ICD-10-CM | POA: Diagnosis not present

## 2015-09-30 LAB — CBC WITH DIFFERENTIAL/PLATELET
BASOS ABS: 0 10*3/uL (ref 0–0.1)
BASOS PCT: 0 %
Basophils Absolute: 0 10*3/uL (ref 0–0.1)
Basophils Relative: 0 %
EOS PCT: 0 %
Eosinophils Absolute: 0 10*3/uL (ref 0–0.7)
Eosinophils Absolute: 0.1 10*3/uL (ref 0–0.7)
Eosinophils Relative: 1 %
HCT: 33.9 % — ABNORMAL LOW (ref 35.0–47.0)
HEMATOCRIT: 36.1 % (ref 35.0–47.0)
Hemoglobin: 11.3 g/dL — ABNORMAL LOW (ref 12.0–16.0)
Hemoglobin: 11.9 g/dL — ABNORMAL LOW (ref 12.0–16.0)
LYMPHS ABS: 0.9 10*3/uL — AB (ref 1.0–3.6)
LYMPHS PCT: 12 %
LYMPHS PCT: 14 %
Lymphs Abs: 1.1 10*3/uL (ref 1.0–3.6)
MCH: 27.8 pg (ref 26.0–34.0)
MCH: 27.8 pg (ref 26.0–34.0)
MCHC: 32.8 g/dL (ref 32.0–36.0)
MCHC: 33.4 g/dL (ref 32.0–36.0)
MCV: 83.3 fL (ref 80.0–100.0)
MCV: 84.8 fL (ref 80.0–100.0)
MONO ABS: 0.6 10*3/uL (ref 0.2–0.9)
MONOS PCT: 8 %
Monocytes Absolute: 0.7 10*3/uL (ref 0.2–0.9)
Monocytes Relative: 9 %
NEUTROS ABS: 6.2 10*3/uL (ref 1.4–6.5)
NEUTROS PCT: 76 %
Neutro Abs: 6 10*3/uL (ref 1.4–6.5)
Neutrophils Relative %: 80 %
PLATELETS: 194 10*3/uL (ref 150–440)
PLATELETS: 213 10*3/uL (ref 150–440)
RBC: 4.07 MIL/uL (ref 3.80–5.20)
RBC: 4.26 MIL/uL (ref 3.80–5.20)
RDW: 22.8 % — AB (ref 11.5–14.5)
RDW: 22.9 % — AB (ref 11.5–14.5)
WBC: 7.6 10*3/uL (ref 3.6–11.0)
WBC: 8.2 10*3/uL (ref 3.6–11.0)

## 2015-09-30 LAB — COMPREHENSIVE METABOLIC PANEL
ALK PHOS: 83 U/L (ref 38–126)
ALT: 8 U/L — AB (ref 14–54)
AST: 22 U/L (ref 15–41)
Albumin: 3.8 g/dL (ref 3.5–5.0)
Anion gap: 8 (ref 5–15)
BUN: 16 mg/dL (ref 6–20)
CALCIUM: 8.6 mg/dL — AB (ref 8.9–10.3)
CHLORIDE: 108 mmol/L (ref 101–111)
CO2: 23 mmol/L (ref 22–32)
CREATININE: 0.89 mg/dL (ref 0.44–1.00)
GFR calc Af Amer: >60 mL/min (ref >60)
GFR, EST NON AFRICAN AMERICAN: 55 mL/min — AB (ref >60)
Glucose, Bld: 109 mg/dL — ABNORMAL HIGH (ref 65–99)
Potassium: 4.6 mmol/L (ref 3.5–5.1)
SODIUM: 139 mmol/L (ref 135–145)
Total Bilirubin: 0.8 mg/dL (ref 0.3–1.2)
Total Protein: 7 g/dL (ref 6.5–8.1)

## 2015-09-30 LAB — TYPE AND SCREEN
ABO/RH(D): O POS
ANTIBODY SCREEN: NEGATIVE

## 2015-09-30 LAB — URINALYSIS COMPLETE WITH MICROSCOPIC (ARMC ONLY)
BILIRUBIN URINE: NEGATIVE
GLUCOSE, UA: NEGATIVE mg/dL
Hgb urine dipstick: NEGATIVE
KETONES UR: NEGATIVE mg/dL
LEUKOCYTES UA: NEGATIVE
NITRITE: NEGATIVE
Protein, ur: NEGATIVE mg/dL
SPECIFIC GRAVITY, URINE: 1.011 (ref 1.005–1.030)
Squamous Epithelial / LPF: NONE SEEN
pH: 6 (ref 5.0–8.0)

## 2015-09-30 LAB — LACTIC ACID, PLASMA
Lactic Acid, Venous: 1.1 mmol/L (ref 0.5–1.9)
Lactic Acid, Venous: 1.3 mmol/L (ref 0.5–1.9)

## 2015-09-30 LAB — TSH: TSH: 6.138 u[IU]/mL — ABNORMAL HIGH (ref 0.350–4.500)

## 2015-09-30 LAB — PROTIME-INR
INR: 1.01
Prothrombin Time: 13.5 seconds (ref 11.4–15.0)

## 2015-09-30 LAB — TROPONIN I: Troponin I: <0.03 ng/mL (ref <0.03)

## 2015-09-30 LAB — HEMOGLOBIN A1C: Hgb A1c MFr Bld: 5.9 % (ref 4.0–6.0)

## 2015-09-30 MED ORDER — ALPRAZOLAM 0.25 MG PO TABS
0.2500 mg | ORAL_TABLET | Freq: Two times a day (BID) | ORAL | Status: DC | PRN
  Administered 2015-09-30 – 2015-10-01 (×2): 0.25 mg via ORAL
  Filled 2015-09-30 (×2): qty 1

## 2015-09-30 MED ORDER — DILTIAZEM HCL 30 MG PO TABS
30.0000 mg | ORAL_TABLET | Freq: Three times a day (TID) | ORAL | Status: DC
Start: 2015-09-30 — End: 2015-10-01
  Administered 2015-09-30 – 2015-10-01 (×2): 30 mg via ORAL
  Filled 2015-09-30 (×2): qty 1

## 2015-09-30 MED ORDER — DIATRIZOATE MEGLUMINE & SODIUM 66-10 % PO SOLN
15.0000 mL | ORAL | Status: AC
  Administered 2015-09-30: 15 mL via ORAL

## 2015-09-30 MED ORDER — ACETAMINOPHEN 325 MG PO TABS: 650.0000 mg | ORAL_TABLET | Freq: Four times a day (QID) | ORAL | Status: DC | PRN

## 2015-09-30 MED ORDER — DIPHENHYDRAMINE HCL 50 MG/ML IJ SOLN: 12.5000 mg | Freq: Four times a day (QID) | INTRAMUSCULAR | Status: DC | PRN

## 2015-09-30 MED ORDER — PANTOPRAZOLE SODIUM 40 MG PO TBEC
40.0000 mg | DELAYED_RELEASE_TABLET | Freq: Every day | ORAL | Status: DC
Start: 2015-09-30 — End: 2015-09-30

## 2015-09-30 MED ORDER — ACETAMINOPHEN 650 MG RE SUPP: 650.0000 mg | Freq: Four times a day (QID) | RECTAL | Status: DC | PRN

## 2015-09-30 MED ORDER — DILTIAZEM HCL 100 MG IV SOLR
5.0000 mg/h | INTRAVENOUS | Status: DC
  Administered 2015-09-30: 5 mg/h via INTRAVENOUS
  Filled 2015-09-30: qty 100

## 2015-09-30 MED ORDER — AMOXICILLIN-POT CLAVULANATE 875-125 MG PO TABS
1.0000 | ORAL_TABLET | Freq: Two times a day (BID) | ORAL | Status: DC
  Administered 2015-09-30 – 2015-10-01 (×3): 1 via ORAL
  Filled 2015-09-30 (×3): qty 1

## 2015-09-30 MED ORDER — CIPROFLOXACIN IN D5W 400 MG/200ML IV SOLN
400.0000 mg | Freq: Once | INTRAVENOUS | Status: AC
  Administered 2015-09-30: 400 mg via INTRAVENOUS
  Filled 2015-09-30: qty 200

## 2015-09-30 MED ORDER — IOPAMIDOL (ISOVUE-370) INJECTION 76%: 75.0000 mL | Freq: Once | INTRAVENOUS | Status: DC | PRN

## 2015-09-30 MED ORDER — DILTIAZEM HCL 60 MG PO TABS
60.0000 mg | ORAL_TABLET | Freq: Three times a day (TID) | ORAL | Status: DC
  Administered 2015-09-30: 60 mg via ORAL
  Filled 2015-09-30: qty 1

## 2015-09-30 MED ORDER — DILTIAZEM HCL 25 MG/5ML IV SOLN
10.0000 mg | Freq: Once | INTRAVENOUS | Status: AC
  Administered 2015-09-30: 10 mg via INTRAVENOUS
  Filled 2015-09-30: qty 5

## 2015-09-30 MED ORDER — SODIUM CHLORIDE 0.9% FLUSH
3.0000 mL | Freq: Two times a day (BID) | INTRAVENOUS | Status: DC
  Administered 2015-09-30 – 2015-10-01 (×3): 3 mL via INTRAVENOUS

## 2015-09-30 MED ORDER — METRONIDAZOLE IN NACL 5-0.79 MG/ML-% IV SOLN
500.0000 mg | Freq: Once | INTRAVENOUS | Status: AC
  Administered 2015-09-30: 500 mg via INTRAVENOUS
  Filled 2015-09-30: qty 100

## 2015-09-30 MED ORDER — SODIUM CHLORIDE 0.9 % IV SOLN
INTRAVENOUS | Status: DC
Start: 2015-09-30 — End: 2015-09-30
  Administered 2015-09-30: 07:00:00 via INTRAVENOUS

## 2015-09-30 MED ORDER — SERTRALINE HCL 50 MG PO TABS
25.0000 mg | ORAL_TABLET | Freq: Every day | ORAL | Status: DC
  Administered 2015-09-30 – 2015-10-02 (×3): 25 mg via ORAL
  Filled 2015-09-30 (×3): qty 1

## 2015-09-30 MED ORDER — DOCUSATE SODIUM 100 MG PO CAPS
100.0000 mg | ORAL_CAPSULE | Freq: Two times a day (BID) | ORAL | Status: DC
  Administered 2015-09-30 – 2015-10-01 (×2): 100 mg via ORAL
  Filled 2015-09-30 (×4): qty 1

## 2015-09-30 MED ORDER — CIPROFLOXACIN IN D5W 400 MG/200ML IV SOLN: 400.0000 mg | Freq: Once | INTRAVENOUS | Status: DC

## 2015-09-30 MED ORDER — LISINOPRIL 5 MG PO TABS: 5.0000 mg | ORAL_TABLET | Freq: Every day | ORAL | Status: DC

## 2015-09-30 MED ORDER — IOPAMIDOL (ISOVUE-300) INJECTION 61%
75.0000 mL | Freq: Once | INTRAVENOUS | Status: AC | PRN
  Administered 2015-09-30: 75 mL via INTRAVENOUS

## 2015-09-30 MED ORDER — ONDANSETRON HCL 4 MG/2ML IJ SOLN: 4.0000 mg | Freq: Four times a day (QID) | INTRAMUSCULAR | Status: DC | PRN

## 2015-09-30 MED ORDER — HYDROCODONE-ACETAMINOPHEN 5-325 MG PO TABS
1.0000 | ORAL_TABLET | Freq: Three times a day (TID) | ORAL | Status: DC | PRN
Start: 2015-09-30 — End: 2015-10-02
  Administered 2015-09-30 – 2015-10-01 (×2): 1 via ORAL
  Filled 2015-09-30 (×2): qty 1

## 2015-09-30 MED ORDER — MORPHINE SULFATE (PF) 2 MG/ML IV SOLN
2.0000 mg | Freq: Once | INTRAVENOUS | Status: AC
  Administered 2015-09-30: 2 mg via INTRAVENOUS

## 2015-09-30 MED ORDER — MORPHINE SULFATE (PF) 2 MG/ML IV SOLN
1.0000 mg | INTRAVENOUS | Status: DC | PRN
  Administered 2015-10-01: 1 mg via INTRAVENOUS
  Filled 2015-09-30: qty 1

## 2015-09-30 MED ORDER — LISINOPRIL 20 MG PO TABS: 20.0000 mg | ORAL_TABLET | Freq: Every day | ORAL | Status: DC

## 2015-09-30 MED ORDER — SODIUM CHLORIDE 0.9 % IV SOLN
INTRAVENOUS | Status: DC
  Administered 2015-09-30 – 2015-10-01 (×3): via INTRAVENOUS

## 2015-09-30 MED ORDER — FAMOTIDINE IN NACL 20-0.9 MG/50ML-% IV SOLN
20.0000 mg | INTRAVENOUS | Status: DC
  Administered 2015-09-30 – 2015-10-02 (×3): 20 mg via INTRAVENOUS
  Filled 2015-09-30 (×3): qty 50

## 2015-09-30 MED ORDER — METOPROLOL TARTRATE 50 MG PO TABS
50.0000 mg | ORAL_TABLET | Freq: Two times a day (BID) | ORAL | Status: DC
  Administered 2015-09-30 – 2015-10-02 (×3): 50 mg via ORAL
  Filled 2015-09-30 (×4): qty 1

## 2015-09-30 MED ORDER — MORPHINE SULFATE (PF) 2 MG/ML IV SOLN
INTRAVENOUS | Status: AC
  Administered 2015-09-30: 2 mg via INTRAVENOUS
  Filled 2015-09-30: qty 1

## 2015-09-30 MED ORDER — ONDANSETRON HCL 4 MG/2ML IJ SOLN
4.0000 mg | Freq: Once | INTRAMUSCULAR | Status: AC
  Administered 2015-09-30: 4 mg via INTRAVENOUS
  Filled 2015-09-30: qty 2

## 2015-09-30 MED ORDER — ONDANSETRON HCL 4 MG PO TABS: 4.0000 mg | ORAL_TABLET | Freq: Four times a day (QID) | ORAL | Status: DC | PRN

## 2015-09-30 MED ORDER — BOOST / RESOURCE BREEZE PO LIQD
1.0000 | Freq: Three times a day (TID) | ORAL | Status: DC
  Administered 2015-10-01 (×2): 1 via ORAL

## 2015-09-30 NOTE — Progress Notes (Signed)
Vital signs taken at 0800 didn't record.

## 2015-09-30 NOTE — Progress Notes (Signed)
Small clots in urine.  Urine is not pink.  Suspect the clots were from her rectum.  She has had "sprinkles" of loose stool each time she pees, with streaks of blood.

## 2015-09-30 NOTE — ED Notes (Signed)
Pt. And pt. Family states pt. Has had lower traverse abdominal pain for the last 3 nights.  Pt. States tonight at around 6 pm sudden onset of vomiting black emesis and bright red blood in bowel movement.  Pt. And pt. Family states pt. Had colonoscopy in April of this year.  Pt. States they found abscess or growth.

## 2015-09-30 NOTE — ED Notes (Signed)
Pharmacy called for ordered Diltiazem drip

## 2015-09-30 NOTE — ED Notes (Signed)
Dr Marcille Blanco at bedside to assess for admission

## 2015-09-30 NOTE — ED Notes (Signed)
Report called to Lattie Haw, RN on 2a by April, RN.

## 2015-09-30 NOTE — ED Notes (Signed)
Pt just finished first bottle of oral contrast; understands to start second bottle at 0310; pt denies nausea; family x 2 at bedside

## 2015-09-30 NOTE — ED Notes (Signed)
Dr sung at bedside; pt's HR 135, showing V-tach on monitor

## 2015-09-30 NOTE — Progress Notes (Signed)
Order for famotidine 20 mg IV q12h was changed to q24h per renal function per protocol.   Lenis Noon, PharmD 09/30/2015 6:31 AM

## 2015-09-30 NOTE — ED Notes (Signed)
Pt noted to go in and out of V-tach, HR 89-147; pt denies chest pain; resting quietly in bed

## 2015-09-30 NOTE — ED Notes (Signed)
ED tech Sarah in to help pt with using the toilet; CT tech then in to take pt for ordered scan

## 2015-09-30 NOTE — Progress Notes (Signed)
Patient ID: Colleen Nguyen, female   DOB: 10/04/1922, 82 y.o.   MRN: [id number] [facility] NAME: Colleen Nguyen    MR#:  [id number]  DATE OF BIRTH:  December 20, 1922  SUBJECTIVE:  Came in with one episode of BRBPR and coffee ground emesis None since admission. No abd pain or fever  REVIEW OF SYSTEMS:   Review of Systems  Constitutional: Negative for fever, chills and weight loss.  HENT: Negative for ear discharge, ear pain and nosebleeds.   Eyes: Negative for blurred vision, pain and discharge.  Respiratory: Negative for sputum production, shortness of breath, wheezing and stridor.   Cardiovascular: Negative for chest pain, palpitations, orthopnea and PND.  Gastrointestinal: Positive for vomiting and blood in stool. Negative for nausea, abdominal pain and diarrhea.  Genitourinary: Negative for urgency and frequency.  Musculoskeletal: Negative for back pain and joint pain.  Neurological: Negative for sensory change, speech change, focal weakness and weakness.  Psychiatric/Behavioral: Negative for depression and hallucinations. The patient is not nervous/anxious.   All other systems reviewed and are negative.  Tolerating Diet:CLD Tolerating PT: pending  DRUG ALLERGIES:   Allergies  Allergen Reactions  . Carisoprodol Other (See Comments)    Reaction:  Hypersomnolence   . Phenergan [Promethazine] Other (See Comments)    Reaction:  Hallucinations   . Norco [Hydrocodone-Acetaminophen] Itching and Rash  . Pravastatin Rash    VITALS:  Blood pressure 122/56, pulse 51, temperature 98.8 F (37.1 C), temperature source Oral, resp. rate 30, height 5' (1.524 m), weight 43.545 kg (96 lb), SpO2 96 %.  PHYSICAL EXAMINATION:   Physical Exam  GENERAL:  82 y.o.-year-old patient lying in the bed with no acute distress.  EYES: Pupils equal, round, reactive to light and accommodation. No scleral icterus. Extraocular muscles intact.   HEENT: Head atraumatic, normocephalic. Oropharynx and nasopharynx clear.  NECK:  Supple, no jugular venous distention. No thyroid enlargement, no tenderness.  LUNGS: Normal breath sounds bilaterally, no wheezing, rales, rhonchi. No use of accessory muscles of respiration.  CARDIOVASCULAR: S1, S2 normal. No murmurs, rubs, or gallops.  ABDOMEN: Soft, nontender, nondistended. Bowel sounds present. No organomegaly or mass.  EXTREMITIES: No cyanosis, clubbing or edema b/l.    NEUROLOGIC: Cranial nerves II through XII are intact. No focal Motor or sensory deficits b/l.   PSYCHIATRIC:  patient is alert and oriented x 3.  SKIN: No obvious rash, lesion, or ulcer.   LABORATORY PANEL:  CBC  Recent Labs Lab 09/30/15 0420  WBC 7.6  HGB 11.3*  HCT 33.9*  PLT 194    Chemistries   Recent Labs Lab 09/29/15 2334  NA 139  K 4.6  CL 108  CO2 23  GLUCOSE 109*  BUN 16  CREATININE 0.89  CALCIUM 8.6*  AST 22  ALT 8*  ALKPHOS 83  BILITOT 0.8   Cardiac Enzymes  Recent Labs Lab 09/29/15 2334  TROPONINI <0.03   RADIOLOGY:  Ct Abdomen Pelvis W Contrast  09/30/2015  CLINICAL DATA:  Sudden onset of vomiting and rectal bleeding around 18:00 EXAM: CT ABDOMEN AND PELVIS WITH CONTRAST TECHNIQUE: Multidetector CT imaging of the abdomen and pelvis was performed using the standard protocol following bolus administration of intravenous contrast. CONTRAST:  38mL ISOVUE-300 IOPAMIDOL (ISOVUE-300) INJECTION 61% COMPARISON:  07/22/2015 FINDINGS: There is marked mural thickening and inflammation involving the descending colon with rather abrupt demarcation. This is suspicious for ischemia, as it does correspond to a fairly typical vascular territory. Nonischemic forms  of colitis are also possible. There is no extraluminal air. There is no bowel obstruction. There is no ascites. There are unremarkable appearances of the liver, gallbladder, bile ducts, pancreas, spleen, adrenals and kidneys. Ureters and urinary  bladder are unremarkable. No acute finding is present in the lower chest. No significant skeletal lesion is evident. Moderately severe lumbar degenerative disc disease is present with multilevel retrolisthesis and anterolisthesis. IMPRESSION: Acute colitis, descending colon. Suspicious for ischemia but other forms of colitis are also possible. No perforation or obstruction. Electronically Signed   By: Andreas Newport M.D.   On: 09/30/2015 04:21   ASSESSMENT AND PLAN:   82 year old female admitted for colitis. 1.Acute Ischemic Colitis: - the patient states that she does not want any surgical intervention if she has ischemic gut.  -IV antibiotics and IV PPI for now .  -She  meets criteria for sepsis via tachycardia and tachypnea however this is likely directly related to her atrial fibrillation. -known h/o of ulcerated cecal tumor and diverticulosis (noted on colonoscopy in 06/2015) and EGD showed gastritis  2. Atrial fibrillation:  -currently off Cardizem gtt -cont po metoprolol  3. Essential hypertension: Continue metoprolol and lisinopril  4. Depression: Continue Zoloft  5. DVT prophylaxis: SCDs  6. GI prophylaxis: Ion PPI  Case discussed with Care Management/Social Worker. Management plans discussed with the patient, family and they are in agreement.  CODE STATUS: Full  DVT Prophylaxis: SCD/TED  TOTAL TIME TAKING CARE OF THIS PATIENT: 30 minutes.  >50% time spent on counselling and coordination of care  POSSIBLE D/C IN 1-2 DAYS, DEPENDING ON CLINICAL CONDITION.  Note: This dictation was prepared with Dragon dictation along with smaller phrase technology. Any transcriptional errors that result from this process are unintentional.  Nickson Middlesworth M.D on 09/30/2015 at 10:22 AM  Between 7am to 6pm - Pager - [phone]  After 6pm go to [email or web address] - password [email or web address] [facility]  [facility]  Office  [phone]  CC: Primary care physician; [facility], Chrissie Noa, MD

## 2015-09-30 NOTE — H&P (Signed)
Colleen Nguyen is an 82 y.o. female.   Chief Complaint: Rectal bleeding HPI: The patient with past medical history of gastrointestinal cancer and atrial fibrillation presents emergency department complaining of one episode of bright red bleeding per rectum. The patient states that her blood loss was painless however it was accompanied with nausea. She has had multiple episodes of nonbloody nonbilious emesis. CT scan of the abdomen revealed colitis. The patient was given IV Flagyl and ciprofloxacin prior to the emergency department staff contacted the hospitalist service for admission.  Past Medical History  Diagnosis Date  . Hypertension   . Atrial fibrillation ([place])   . Anxiety   . Anemia     Past Surgical History  Procedure Laterality Date  . Appendectomy    . Abdominal hysterectomy    . Esophagogastroduodenoscopy (egd) with propofol N/A 07/23/2015    Procedure: ESOPHAGOGASTRODUODENOSCOPY (EGD) WITH PROPOFOL;  Surgeon: Lucilla Lame, MD;  Location: [facility] ENDOSCOPY;  Service: Endoscopy;  Laterality: N/A;  . Colonoscopy with propofol N/A 07/23/2015    Procedure: COLONOSCOPY WITH PROPOFOL;  Surgeon: Lucilla Lame, MD;  Location: [facility] ENDOSCOPY;  Service: Endoscopy;  Laterality: N/A;    Family History  Problem Relation Age of Onset  . Leukemia Mother   . Stroke Father   . CAD Brother    Social History:  reports that she has never smoked. She does not have any smokeless tobacco history on file. She reports that she does not drink alcohol or use illicit drugs.  Allergies:  Allergies  Allergen Reactions  . Carisoprodol Other (See Comments)    Reaction:  Hypersomnolence   . Phenergan [Promethazine] Other (See Comments)    Reaction:  Hallucinations   . Norco [Hydrocodone-Acetaminophen] Itching and Rash  . Pravastatin Rash    Medications Prior to Admission  Medication Sig Dispense Refill  . ALPRAZolam (XANAX) 0.5 MG tablet Take 0.25-0.5 mg by mouth 2 (two) times daily as needed for  anxiety or sleep.     . feeding supplement (BOOST / RESOURCE BREEZE) LIQD Take 1 Container by mouth 3 (three) times daily between meals. 30 Container 0  . HYDROcodone-acetaminophen (NORCO/VICODIN) 5-325 MG tablet Take 1 tablet by mouth every 8 (eight) hours as needed for moderate pain.    Marland Kitchen lisinopril (PRINIVIL,ZESTRIL) 10 MG tablet Take 20 mg by mouth daily.     . metoprolol (LOPRESSOR) 50 MG tablet Take 50 mg by mouth 2 (two) times daily.    Marland Kitchen omeprazole (PRILOSEC) 20 MG capsule Take 20 mg by mouth daily.    . sertraline (ZOLOFT) 25 MG tablet Take 25 mg by mouth daily.  1    Results for orders placed or performed during the hospital encounter of 09/29/15 (from the past 48 hour(s))  CBC with Differential     Status: Abnormal   Collection Time: 09/29/15 11:34 PM  Result Value Ref Range   WBC 8.2 3.6 - 11.0 K/uL   RBC 4.26 3.80 - 5.20 MIL/uL   Hemoglobin 11.9 (L) 12.0 - 16.0 g/dL   HCT 36.1 35.0 - 47.0 %   MCV 84.8 80.0 - 100.0 fL   MCH 27.8 26.0 - 34.0 pg   MCHC 32.8 32.0 - 36.0 g/dL   RDW 22.8 (H) 11.5 - 14.5 %   Platelets 213 150 - 440 K/uL   Neutrophils Relative % 76 %   Neutro Abs 6.2 1.4 - 6.5 K/uL   Lymphocytes Relative 14 %   Lymphs Abs 1.1 1.0 - 3.6 K/uL   Monocytes Relative 9 %  Monocytes Absolute 0.7 0.2 - 0.9 K/uL   Eosinophils Relative 1 %   Eosinophils Absolute 0.1 0 - 0.7 K/uL   Basophils Relative 0 %   Basophils Absolute 0.0 0 - 0.1 K/uL  Comprehensive metabolic panel     Status: Abnormal   Collection Time: 09/29/15 11:34 PM  Result Value Ref Range   Sodium 139 135 - 145 mmol/L   Potassium 4.6 3.5 - 5.1 mmol/L    Comment: HEMOLYSIS AT THIS LEVEL MAY AFFECT RESULT   Chloride 108 101 - 111 mmol/L   CO2 23 22 - 32 mmol/L   Glucose, Bld 109 (H) 65 - 99 mg/dL   BUN 16 6 - 20 mg/dL   Creatinine, Ser 0.89 0.44 - 1.00 mg/dL   Calcium 8.6 (L) 8.9 - 10.3 mg/dL   Total Protein 7.0 6.5 - 8.1 g/dL   Albumin 3.8 3.5 - 5.0 g/dL   AST 22 15 - 41 U/L   ALT 8 (L) 14 - 54  U/L   Alkaline Phosphatase 83 38 - 126 U/L   Total Bilirubin 0.8 0.3 - 1.2 mg/dL   GFR calc non Af Amer 55 (L) >60 mL/min   GFR calc Af Amer >60 >60 mL/min    Comment: (NOTE) The eGFR has been calculated using the CKD EPI equation. This calculation has not been validated in all clinical situations. eGFR's persistently <60 mL/min signify possible Chronic Kidney Disease.    Anion gap 8 5 - 15  Troponin I     Status: None   Collection Time: 09/29/15 11:34 PM  Result Value Ref Range   Troponin I <0.03 <0.03 ng/mL  Protime-INR     Status: None   Collection Time: 09/29/15 11:34 PM  Result Value Ref Range   Prothrombin Time 13.5 11.4 - 15.0 seconds   INR 1.01   Type and screen [place]     Status: None   Collection Time: 09/30/15 12:06 AM  Result Value Ref Range   ABO/RH(D) O POS    Antibody Screen NEG    Sample Expiration 10/03/2015   Urinalysis complete, with microscopic ([facility] only)     Status: Abnormal   Collection Time: 09/30/15  1:09 AM  Result Value Ref Range   Color, Urine YELLOW (A) YELLOW   APPearance CLEAR (A) CLEAR   Glucose, UA NEGATIVE NEGATIVE mg/dL   Bilirubin Urine NEGATIVE NEGATIVE   Ketones, ur NEGATIVE NEGATIVE mg/dL   Specific Gravity, Urine 1.011 1.005 - 1.030   Hgb urine dipstick NEGATIVE NEGATIVE   pH 6.0 5.0 - 8.0   Protein, ur NEGATIVE NEGATIVE mg/dL   Nitrite NEGATIVE NEGATIVE   Leukocytes, UA NEGATIVE NEGATIVE   RBC / HPF 0-5 0 - 5 RBC/hpf   WBC, UA 0-5 0 - 5 WBC/hpf   Bacteria, UA RARE (A) NONE SEEN   Squamous Epithelial / LPF NONE SEEN NONE SEEN   Mucous PRESENT    Hyaline Casts, UA PRESENT   CBC with Differential     Status: Abnormal   Collection Time: 09/30/15  4:20 AM  Result Value Ref Range   WBC 7.6 3.6 - 11.0 K/uL   RBC 4.07 3.80 - 5.20 MIL/uL   Hemoglobin 11.3 (L) 12.0 - 16.0 g/dL   HCT 33.9 (L) 35.0 - 47.0 %   MCV 83.3 80.0 - 100.0 fL   MCH 27.8 26.0 - 34.0 pg   MCHC 33.4 32.0 - 36.0 g/dL   RDW 22.9 (H)  11.5 - 14.5 %  Platelets 194 150 - 440 K/uL   Neutrophils Relative % 80 %   Neutro Abs 6.0 1.4 - 6.5 K/uL   Lymphocytes Relative 12 %   Lymphs Abs 0.9 (L) 1.0 - 3.6 K/uL   Monocytes Relative 8 %   Monocytes Absolute 0.6 0.2 - 0.9 K/uL   Eosinophils Relative 0 %   Eosinophils Absolute 0.0 0 - 0.7 K/uL   Basophils Relative 0 %   Basophils Absolute 0.0 0 - 0.1 K/uL  Lactic acid, plasma     Status: None   Collection Time: 09/30/15  4:52 AM  Result Value Ref Range   Lactic Acid, Venous 1.1 0.5 - 1.9 mmol/L   Ct Abdomen Pelvis W Contrast  09/30/2015  CLINICAL DATA:  Sudden onset of vomiting and rectal bleeding around 18:00 EXAM: CT ABDOMEN AND PELVIS WITH CONTRAST TECHNIQUE: Multidetector CT imaging of the abdomen and pelvis was performed using the standard protocol following bolus administration of intravenous contrast. CONTRAST:  10m ISOVUE-300 IOPAMIDOL (ISOVUE-300) INJECTION 61% COMPARISON:  07/22/2015 FINDINGS: There is marked mural thickening and inflammation involving the descending colon with rather abrupt demarcation. This is suspicious for ischemia, as it does correspond to a fairly typical vascular territory. Nonischemic forms of colitis are also possible. There is no extraluminal air. There is no bowel obstruction. There is no ascites. There are unremarkable appearances of the liver, gallbladder, bile ducts, pancreas, spleen, adrenals and kidneys. Ureters and urinary bladder are unremarkable. No acute finding is present in the lower chest. No significant skeletal lesion is evident. Moderately severe lumbar degenerative disc disease is present with multilevel retrolisthesis and anterolisthesis. IMPRESSION: Acute colitis, descending colon. Suspicious for ischemia but other forms of colitis are also possible. No perforation or obstruction. Electronically Signed   By: DAndreas NewportM.D.   On: 09/30/2015 04:21    Review of Systems  Constitutional: Negative for fever and chills.  HENT:  Negative for sore throat and tinnitus.   Eyes: Negative for blurred vision and redness.  Respiratory: Negative for cough and shortness of breath.   Cardiovascular: Negative for chest pain, palpitations, orthopnea and PND.  Gastrointestinal: Positive for nausea, vomiting, abdominal pain and blood in stool. Negative for diarrhea.  Genitourinary: Negative for dysuria, urgency and frequency.  Musculoskeletal: Negative for myalgias and joint pain.  Skin: Negative for rash.       No lesions  Neurological: Negative for speech change, focal weakness and weakness.  Endo/Heme/Allergies: Does not bruise/bleed easily.       No temperature intolerance  Psychiatric/Behavioral: Negative for depression and suicidal ideas.    Blood pressure 108/64, pulse 137, temperature 98.8 F (37.1 C), temperature source Oral, resp. rate 30, height 5' (1.524 m), weight 43.545 kg (96 lb), SpO2 96 %. Physical Exam  Vitals reviewed. Constitutional: She is oriented to person, place, and time. She appears well-developed and well-nourished. No distress.  HENT:  Head: Normocephalic and atraumatic.  Mouth/Throat: Oropharynx is clear and moist.  Eyes: Conjunctivae and EOM are normal. Pupils are equal, round, and reactive to light. No scleral icterus.  Neck: Normal range of motion. Neck supple. No JVD present. No tracheal deviation present. No thyromegaly present.  Cardiovascular: Normal rate, regular rhythm and normal heart sounds.  Exam reveals no gallop and no friction rub.   No murmur heard. Respiratory: Effort normal and breath sounds normal.  GI: Soft. Bowel sounds are normal. She exhibits no distension and no mass. There is tenderness. There is guarding (Voluntary). There is no rebound.  Genitourinary:  Deferred  Musculoskeletal: Normal range of motion.  Lymphadenopathy:    She has no cervical adenopathy.  Neurological: She is alert and oriented to person, place, and time. No cranial nerve deficit. She exhibits  normal muscle tone.  Skin: Skin is warm and dry. Rash (Right forearm) noted. There is erythema.  Psychiatric: She has a normal mood and affect. Her behavior is normal. Judgment and thought content normal.     Assessment/Plan This is a 82 year old female admitted for colitis. 1. Colitis: Possibly ischemic; the patient states that she does not want any surgical intervention if she has ischemic gut. IV antibiotics and IV PPI for now . She intermittently meets criteria for sepsis via tachycardia and tachypnea however this is likely directly related to her atrial fibrillation. 2. Atrial fibrillation: With rapid ventricular rate; notably the patient's physician discontinued her long-standing home med of diltiazem as he thought this may have caused the rash on her arm. She has not had the antiarrhythmic for 2 days. She was given diltiazem 10 mg IV the emergency department prior to the initiation of a diltiazem drip. Monitor telemetry. Restart oral diltiazem for maintenance dosing. 3. Essential hypertension: Continue metoprolol and lisinopril 4. Depression: Continue Zoloft 5. DVT prophylaxis: SCDs 6. GI prophylaxis: IV pantoprazole The patient is a full code. Time spent on admission orders and patient care approximately 45 minutes  Harrie Foreman, MD 09/30/2015, 6:21 AM

## 2015-09-30 NOTE — ED Notes (Signed)
Pt in CT.

## 2015-09-30 NOTE — Progress Notes (Addendum)
Dr. Posey Pronto  Notified of HR running in 60's, B/P stable requested to stop Cardizem drip. New order to stop drip. Drip discontinued.  Will continue to monitor pt.

## 2015-09-30 NOTE — Progress Notes (Signed)
Pt. Arrived to unit via stretcher, staff transferred to bed. Tele applied, box verified with Merry Proud, CNA.  pt in a-fib, on Cardizem drip at 5. Pt. Alert and oriented. No signs or c/o pain at this time. Pt. Assisted to use bed pan, where there was urine/coffee ground liquid stool mixture. Cipro IV started, NS started at 146ml/hr. Skin verified with Baldo Ash, RN. Ecchymosis to BUE, flat red rash to right forearm, pt. Stated is from one of her medications, she's just not sure which one. Stated the rash does not hurt nor does it itch. How to use ascom and call bell system explained. General room orientation given. Bed alarm activated. Bed is in lowest position. Will continue to monitor pt.

## 2015-09-30 NOTE — Progress Notes (Signed)
Remains NPO for possible Peg tube. Received meds crushed with applesauce.

## 2015-10-01 LAB — CBC
HCT: 28.8 % — ABNORMAL LOW (ref 35.0–47.0)
HEMOGLOBIN: 9.5 g/dL — AB (ref 12.0–16.0)
MCH: 27.6 pg (ref 26.0–34.0)
MCHC: 33 g/dL (ref 32.0–36.0)
MCV: 83.4 fL (ref 80.0–100.0)
Platelets: 170 10*3/uL (ref 150–440)
RBC: 3.45 MIL/uL — ABNORMAL LOW (ref 3.80–5.20)
RDW: 22.4 % — ABNORMAL HIGH (ref 11.5–14.5)
WBC: 4.4 10*3/uL (ref 3.6–11.0)

## 2015-10-01 MED ORDER — AMOXICILLIN-POT CLAVULANATE 500-125 MG PO TABS
1.0000 | ORAL_TABLET | Freq: Two times a day (BID) | ORAL | Status: DC
  Administered 2015-10-01 – 2015-10-02 (×2): 500 mg via ORAL
  Filled 2015-10-01 (×3): qty 1

## 2015-10-01 NOTE — Progress Notes (Signed)
Pt alert and oriented x4, no complaints of pain or discomfort.  Bed in low position, call bell within reach.  Bed alarms on and functioning.  Assessment done and charted.  Will continue to monitor and do hourly rounding throughout the shift 

## 2015-10-01 NOTE — Care Management Important Message (Signed)
Important Message  Patient Details  Name: Colleen Nguyen MRN: [id number] Date of Birth: 01/01/1923   Medicare Important Message Given:  Yes    Jolly Mango, RN 10/01/2015, 2:30 PM

## 2015-10-01 NOTE — Progress Notes (Signed)
Patient ID: Colleen Nguyen, female   DOB: 07/31/1922, 82 y.o.   MRN: [id number] [place] [facility] [place] NAME: Colleen Nguyen    MR#:  [id number]  DATE OF BIRTH:  1922/05/31  SUBJECTIVE:  Came in with one episode of BRBPR and coffee ground emesis None since admission. No abd pain or fever Had one episode of diarrhea today  REVIEW OF SYSTEMS:   Review of Systems  Constitutional: Negative for fever, chills and weight loss.  HENT: Negative for ear discharge, ear pain and nosebleeds.   Eyes: Negative for blurred vision, pain and discharge.  Respiratory: Negative for sputum production, shortness of breath, wheezing and stridor.   Cardiovascular: Negative for chest pain, palpitations, orthopnea and PND.  Gastrointestinal: Positive for vomiting and blood in stool. Negative for nausea, abdominal pain and diarrhea.  Genitourinary: Negative for urgency and frequency.  Musculoskeletal: Negative for back pain and joint pain.  Neurological: Negative for sensory change, speech change, focal weakness and weakness.  Psychiatric/Behavioral: Negative for depression and hallucinations. The patient is not nervous/anxious.   All other systems reviewed and are negative.  Tolerating Diet:soft Tolerating PT: pending  DRUG ALLERGIES:   Allergies  Allergen Reactions  . Carisoprodol Other (See Comments)    Reaction:  Hypersomnolence   . Phenergan [Promethazine] Other (See Comments)    Reaction:  Hallucinations   . Norco [Hydrocodone-Acetaminophen] Itching and Rash  . Pravastatin Rash    VITALS:  Blood pressure 147/54, pulse 50, temperature 97.5 F (36.4 C), temperature source Oral, resp. rate 18, height 5' (1.524 m), weight 43.908 kg (96 lb 12.8 oz), SpO2 97 %.  PHYSICAL EXAMINATION:   Physical Exam  GENERAL:  82 y.o.-year-old patient lying in the bed with no acute distress.  EYES: Pupils equal, round, reactive to light and accommodation. No  scleral icterus. Extraocular muscles intact.  HEENT: Head atraumatic, normocephalic. Oropharynx and nasopharynx clear.  NECK:  Supple, no jugular venous distention. No thyroid enlargement, no tenderness.  LUNGS: Normal breath sounds bilaterally, no wheezing, rales, rhonchi. No use of accessory muscles of respiration.  CARDIOVASCULAR: S1, S2 normal. No murmurs, rubs, or gallops.  ABDOMEN: Soft, nontender, nondistended. Bowel sounds present. No organomegaly or mass.  EXTREMITIES: No cyanosis, clubbing or edema b/l.    NEUROLOGIC: Cranial nerves II through XII are intact. No focal Motor or sensory deficits b/l.   PSYCHIATRIC:  patient is alert and oriented x 3.  SKIN: No obvious rash, lesion, or ulcer.   LABORATORY PANEL:  CBC  Recent Labs Lab 10/01/15 0331  WBC 4.4  HGB 9.5*  HCT 28.8*  PLT 170    Chemistries   Recent Labs Lab 09/29/15 2334  NA 139  K 4.6  CL 108  CO2 23  GLUCOSE 109*  BUN 16  CREATININE 0.89  CALCIUM 8.6*  AST 22  ALT 8*  ALKPHOS 83  BILITOT 0.8   Cardiac Enzymes  Recent Labs Lab 09/29/15 2334  TROPONINI <0.03   RADIOLOGY:  Ct Abdomen Pelvis W Contrast  09/30/2015  CLINICAL DATA:  Sudden onset of vomiting and rectal bleeding around 18:00 EXAM: CT ABDOMEN AND PELVIS WITH CONTRAST TECHNIQUE: Multidetector CT imaging of the abdomen and pelvis was performed using the standard protocol following bolus administration of intravenous contrast. CONTRAST:  3mL ISOVUE-300 IOPAMIDOL (ISOVUE-300) INJECTION 61% COMPARISON:  07/22/2015 FINDINGS: There is marked mural thickening and inflammation involving the descending colon with rather abrupt demarcation. This is suspicious for ischemia, as it does correspond  to a fairly typical vascular territory. Nonischemic forms of colitis are also possible. There is no extraluminal air. There is no bowel obstruction. There is no ascites. There are unremarkable appearances of the liver, gallbladder, bile ducts, pancreas,  spleen, adrenals and kidneys. Ureters and urinary bladder are unremarkable. No acute finding is present in the lower chest. No significant skeletal lesion is evident. Moderately severe lumbar degenerative disc disease is present with multilevel retrolisthesis and anterolisthesis. IMPRESSION: Acute colitis, descending colon. Suspicious for ischemia but other forms of colitis are also possible. No perforation or obstruction. Electronically Signed   By: Andreas Newport M.D.   On: 09/30/2015 04:21   ASSESSMENT AND PLAN:   82 year old female admitted for colitis. 1.Acute Ischemic Colitis: - the patient states that she does not want any surgical intervention if she has ischemic gut.  -IV antibiotics and IV PPI for now .  -She  meets criteria for sepsis via tachycardia and tachypnea however this is likely directly related to her atrial fibrillation. -known h/o of ulcerated cecal tumor and diverticulosis (noted on colonoscopy in 06/2015) and EGD showed gastritis  2. Atrial fibrillation:  -currently off Cardizem gtt -cont po metoprolol  3. Essential hypertension: Continue metoprolol and lisinopril  4. Depression: Continue Zoloft  5. DVT prophylaxis: SCDs  6. GI prophylaxis: Ion PPI  Will keep her today to ensure diarrhea does not worsen  Case discussed with Care Management/Social Worker. Management plans discussed with the patient, family and they are in agreement.  CODE STATUS: Full  DVT Prophylaxis: SCD/TED  TOTAL TIME TAKING CARE OF THIS PATIENT: 30 minutes.  >50% time spent on counselling and coordination of care  POSSIBLE D/C IN 1 DAYS, DEPENDING ON CLINICAL CONDITION.  Note: This dictation was prepared with Dragon dictation along with smaller phrase technology. Any transcriptional errors that result from this process are unintentional.  Rene Sizelove M.D on 10/01/2015 at 11:40 AM  Between 7am to 6pm - Pager - [phone]  After 6pm go to [email or web address] - password [email or web address]  [facility]  [facility]  Office  [phone]  CC: Primary care physician; [facility], Chrissie Noa, MD

## 2015-10-01 NOTE — Progress Notes (Signed)
Pt complaining of pain and was medicated will reassess in 30 minutes

## 2015-10-01 NOTE — Progress Notes (Signed)
Pt a/o. Son at bedside. Iv uncomfortable. reisted in rt forearm. Ambulated with single (standby) assist. Walked the loop around station. tol well. 2 bm's this shift. No c/o of pain

## 2015-10-02 MED ORDER — AMOXICILLIN-POT CLAVULANATE 500-125 MG PO TABS: 1.0000 | ORAL_TABLET | Freq: Two times a day (BID) | ORAL | Status: DC

## 2015-10-02 MED ORDER — LISINOPRIL 10 MG PO TABS
10.0000 mg | ORAL_TABLET | Freq: Once | ORAL | Status: AC
  Administered 2015-10-02: 10 mg via ORAL
  Filled 2015-10-02: qty 1

## 2015-10-02 NOTE — Progress Notes (Signed)
PATIENT DISCHARGE SUMMARY AND F/U CARE GIVEN TO BOTH PT AND SON VERBALIZED UNDERSTANDING, DISCHARGE IN A STABLE CONDITION .

## 2015-10-02 NOTE — Discharge Summary (Signed)
[facility] NAME: Colleen Nguyen    MR#:  [id number]  DATE OF BIRTH:  [birth year]  DATE OF ADMISSION:  09/29/2015 ADMITTING PHYSICIAN: Harrie Foreman, MD  DATE OF DISCHARGE: 10/02/15  PRIMARY CARE PHYSICIAN: FELDPAUSCH, DALE E, MD    ADMISSION DIAGNOSIS:  Colitis [K52.9] Lower abdominal pain [R10.30] Atrial fibrillation with rapid ventricular response ([place]) [I48.91]  DISCHARGE DIAGNOSIS:  Acute Colitis Chronic Afib  HTN  SECONDARY DIAGNOSIS:   Past Medical History  Diagnosis Date  . Hypertension   . Atrial fibrillation ([place])   . Anxiety   . Anemia     HOSPITAL COURSE:  82 year old female admitted for colitis. 1.Acute Ischemic Colitis: - the patient states that she does not want any surgical intervention if she has ischemic gut.  -oral augmentin .  -She meets criteria for sepsis via tachycardia and tachypnea however this is likely directly related to her atrial fibrillation. -known h/o of ulcerated cecal tumor and diverticulosis (noted on colonoscopy in 06/2015) and EGD showed gastritis  2. Atrial fibrillation:  -currently off Cardizem gtt -cont po metoprolol  3. Essential hypertension: Continue metoprolol and lisinopril  4. Depression: Continue Zoloft  5. DVT prophylaxis: SCDs  6. GI prophylaxis: Ion PPI  Overall stable D/c home D/w son and daughter  CONSULTS OBTAINED:     DRUG ALLERGIES:   Allergies  Allergen Reactions  . Carisoprodol Other (See Comments)    Reaction:  Hypersomnolence   . Phenergan [Promethazine] Other (See Comments)    Reaction:  Hallucinations   . Norco [Hydrocodone-Acetaminophen] Itching and Rash  . Pravastatin Rash    DISCHARGE MEDICATIONS:   Current Discharge Medication List    START taking these medications   Details  amoxicillin-clavulanate (AUGMENTIN) 500-125 MG tablet Take 1 tablet (500 mg total) by mouth every 12 (twelve) hours. Qty: 12 tablet,  Refills: 0      CONTINUE these medications which have NOT CHANGED   Details  ALPRAZolam (XANAX) 0.5 MG tablet Take 0.25-0.5 mg by mouth 2 (two) times daily as needed for anxiety or sleep.     feeding supplement (BOOST / RESOURCE BREEZE) LIQD Take 1 Container by mouth 3 (three) times daily between meals. Qty: 30 Container, Refills: 0    HYDROcodone-acetaminophen (NORCO/VICODIN) 5-325 MG tablet Take 1 tablet by mouth every 8 (eight) hours as needed for moderate pain.    lisinopril (PRINIVIL,ZESTRIL) 10 MG tablet Take 20 mg by mouth daily.     metoprolol (LOPRESSOR) 50 MG tablet Take 50 mg by mouth 2 (two) times daily.    omeprazole (PRILOSEC) 20 MG capsule Take 20 mg by mouth daily.    sertraline (ZOLOFT) 25 MG tablet Take 25 mg by mouth daily. Refills: 1        If you experience worsening of your admission symptoms, develop shortness of breath, life threatening emergency, suicidal or homicidal thoughts you must seek medical attention immediately by calling 911 or calling your MD immediately  if symptoms less severe.  You Must read complete instructions/literature along with all the possible adverse reactions/side effects for all the Medicines you take and that have been prescribed to you. Take any new Medicines after you have completely understood and accept all the possible adverse reactions/side effects.   Please note  You were cared for by a hospitalist during your hospital stay. If you have any questions about your discharge medications or the care you received while you were in the hospital after you  are discharged, you can call the unit and asked to speak with the hospitalist on call if the hospitalist that took care of you is not available. Once you are discharged, your primary care physician will handle any further medical issues. Please note that NO REFILLS for any discharge medications will be authorized once you are discharged, as it is imperative that you return to your  primary care physician (or establish a relationship with a primary care physician if you do not have one) for your aftercare needs so that they can reassess your need for medications and monitor your lab values. Today   SUBJECTIVE   No new complaints. Had 2 small brown stools  VITAL SIGNS:  Blood pressure 142/60, pulse 56, temperature 98.6 F (37 C), temperature source Oral, resp. rate 20, height 5' (1.524 m), weight 45.133 kg (99 lb 8 oz), SpO2 97 %.  I/O:   Intake/Output Summary (Last 24 hours) at 10/02/15 0721 Last data filed at 10/01/15 1800  Gross per 24 hour  Intake    760 ml  Output    400 ml  Net    360 ml    PHYSICAL EXAMINATION:  GENERAL:  82 y.o.-year-old patient lying in the bed with no acute distress.  EYES: Pupils equal, round, reactive to light and accommodation. No scleral icterus. Extraocular muscles intact.  HEENT: Head atraumatic, normocephalic. Oropharynx and nasopharynx clear.  NECK:  Supple, no jugular venous distention. No thyroid enlargement, no tenderness.  LUNGS: Normal breath sounds bilaterally, no wheezing, rales,rhonchi or crepitation. No use of accessory muscles of respiration.  CARDIOVASCULAR: S1, S2 normal. No murmurs, rubs, or gallops.  ABDOMEN: Soft, non-tender, non-distended. Bowel sounds present. No organomegaly or mass.  EXTREMITIES: No pedal edema, cyanosis, or clubbing.  NEUROLOGIC: Cranial nerves II through XII are intact. Muscle strength 5/5 in all extremities. Sensation intact. Gait not checked.  PSYCHIATRIC: The patient is alert and oriented x 3.  SKIN: No obvious rash, lesion, or ulcer.   DATA REVIEW:   CBC   Recent Labs Lab 10/01/15 0331  WBC 4.4  HGB 9.5*  HCT 28.8*  PLT 170    Chemistries   Recent Labs Lab 09/29/15 2334  NA 139  K 4.6  CL 108  CO2 23  GLUCOSE 109*  BUN 16  CREATININE 0.89  CALCIUM 8.6*  AST 22  ALT 8*  ALKPHOS 83  BILITOT 0.8    Microbiology Results   No results found for this or any  previous visit (from the past 240 hour(s)).  RADIOLOGY:  No results found.   Management plans discussed with the patient, family and they are in agreement.  CODE STATUS:     Code Status Orders        Start     Ordered   09/30/15 0614  Full code   Continuous     09/30/15 0613    Code Status History    Date Active Date Inactive Code Status Order ID Comments User Context   07/24/2015  6:00 PM 07/25/2015  6:08 PM Full Code [id number]  Epifanio Lesches, MD ED   07/22/2015  4:22 PM 07/24/2015  3:21 PM Full Code [id number]  Idelle Crouch, MD Inpatient      TOTAL TIME TAKING CARE OF THIS PATIENT: 40 minutes.    Dahna Hattabaugh M.D on 10/02/2015 at 7:21 AM  Between 7am to 6pm - Pager - [phone] After 6pm go to [email or web address] - [facility]  Office  [phone]  CC: Primary care physician; [facility], Chrissie Noa, MD

## 2015-10-02 NOTE — Discharge Instructions (Signed)
Soft diet

## 2015-10-02 NOTE — Progress Notes (Signed)
DR PATEL WAS INFORMED OF PT'S HIGH BP 165/55 AND ORDER A ONE TIME LISINOPRIL TO BE GIVEN BEFORE DISCHARGE

## 2015-10-05 DIAGNOSIS — M129 Arthropathy, unspecified: Secondary | ICD-10-CM | POA: Diagnosis not present

## 2015-10-05 DIAGNOSIS — F419 Anxiety disorder, unspecified: Secondary | ICD-10-CM | POA: Diagnosis not present

## 2015-10-05 DIAGNOSIS — I480 Paroxysmal atrial fibrillation: Secondary | ICD-10-CM | POA: Diagnosis not present

## 2015-10-31 DIAGNOSIS — I480 Paroxysmal atrial fibrillation: Secondary | ICD-10-CM | POA: Diagnosis not present

## 2015-10-31 DIAGNOSIS — R001 Bradycardia, unspecified: Secondary | ICD-10-CM | POA: Diagnosis not present

## 2015-10-31 DIAGNOSIS — I10 Essential (primary) hypertension: Secondary | ICD-10-CM | POA: Diagnosis not present

## 2015-10-31 DIAGNOSIS — I2510 Atherosclerotic heart disease of native coronary artery without angina pectoris: Secondary | ICD-10-CM | POA: Diagnosis not present

## 2016-01-07 DIAGNOSIS — F419 Anxiety disorder, unspecified: Secondary | ICD-10-CM | POA: Diagnosis not present

## 2016-01-07 DIAGNOSIS — I480 Paroxysmal atrial fibrillation: Secondary | ICD-10-CM | POA: Diagnosis not present

## 2016-01-07 DIAGNOSIS — I10 Essential (primary) hypertension: Secondary | ICD-10-CM | POA: Diagnosis not present

## 2016-01-10 ENCOUNTER — Other Ambulatory Visit: Payer: Self-pay

## 2016-01-10 NOTE — Patient Outreach (Addendum)
[facility]) Care Management  01/10/2016  Colleen Nguyen 06-19-1922 [id number]   Telephone Screen  Referral Date: 01/09/16 Referral Source: EMMI Prevent Call Referral Reason: " trouble with BP and meds"    Outreach attempt # 1 to patient. Spoke with patient who gave verbal consent and requested that RN CM complete call with her dtr-Colleen Nguyen who normally handles her affairs.    Social: Patient resides in her home along with her dtr. She is independent with ADLs and requires some assistance with IADLs. Dtr takes patient to appts. She denies any recent falls. Dtr reports that patient has walker in the home but doesn't normally need to use it.  Conditions: Patient has history of HTN, A-Fib, anxiety and anemia. Dtr states that she was having issues with patient's BP at one time. She reports BP was running in the 180s/80s. However, she reports that she has been monitoring BP multiple times a day here lately and BP has been in the 130s. Patient asymptomatic when BP elevated. Dtr reports that lately patient's A-Fib has been controlled.   Medications: Dtr reported that patient is taking 7 meds. She states she had an issues with insurance not paying for BP med as refill was too soon/early. She states that issue has since resolved.    Appointments:Patient has no upcoming appts.   Advance Directives: Patient does not have any and declined further info at this time.   Consent: [facility] services reviewed and discussed. Patient/dtr gave verbal consent for services. Patient has no care coordination needs at this time but agreeable to further education and support in HTN management.    Plan: RN CM will notify [facility] administrative assistant of case status. RN CM will make outreach attempt to patient within a month.  RN CM will send successful outreach letter to patient.   [name], RN,BSN,CCM [place] Management Telephonic Care Management Coordinator Direct Phone:  [phone] Toll Free: [phone] Fax: [phone]

## 2016-01-29 ENCOUNTER — Other Ambulatory Visit: Payer: Self-pay

## 2016-01-29 NOTE — Patient Outreach (Signed)
[facility]) Care Management  01/29/2016  Colleen Nguyen 1922/09/07 [id number]   Initial Assessment Attempt   Outreach attempt to patient. Spoke with dtr who reported patient was asleep and requested call back at another time.     Plan: RN CM will make outreach attempt to patient within a week.   [name], RN,BSN,CCM [place] Management Telephonic Care Management Coordinator Direct Phone: [phone] Toll Free: [phone] Fax: [phone]

## 2016-01-31 ENCOUNTER — Other Ambulatory Visit: Payer: Self-pay

## 2016-01-31 NOTE — Patient Outreach (Signed)
[facility]) Care Management  01/31/2016  Colleen Nguyen [birth year] [id number]   Initial Assessment Attempt   Outreach attempt #2. No answer at present.    Plan: RN CM will make outreach attempt to patient within a week.  [name], RN,BSN,CCM [place] Management Telephonic Care Management Coordinator Direct Phone: [phone] Toll Free: [phone] Fax: [phone]

## 2016-02-04 DIAGNOSIS — F419 Anxiety disorder, unspecified: Secondary | ICD-10-CM | POA: Diagnosis not present

## 2016-02-04 DIAGNOSIS — M129 Arthropathy, unspecified: Secondary | ICD-10-CM | POA: Diagnosis not present

## 2016-02-04 DIAGNOSIS — I10 Essential (primary) hypertension: Secondary | ICD-10-CM | POA: Diagnosis not present

## 2016-02-05 ENCOUNTER — Other Ambulatory Visit: Payer: Self-pay

## 2016-02-05 NOTE — Patient Outreach (Signed)
[facility] [facility]) Care Management  02/05/2016  Colleen Nguyen 02-11-1923 [id number]   Initial Assessment Attempt     Outreach attempt to patient. Line busy after multiple attempts.   Plan: RN CM will make outreach attempt to patient within three business days.   [name], RN,BSN,CCM [name] Telephonic Care Management Coordinator Direct Phone: [phone] Toll Free: [phone] Fax: [phone]

## 2016-02-06 ENCOUNTER — Other Ambulatory Visit: Payer: Self-pay

## 2016-02-06 NOTE — Patient Outreach (Signed)
[place] [facility]) Care Management  02/06/2016  Colleen Nguyen Nov 07, 1922 [id number]     Outreach call to patient. Spoke with patinet. She voices that she is doing well. She reports she had MD appt recently and had a good appt. Patient's dtr continues to assist and help her manage her care needs. Patient reports that her BP has been very good and no further BP concerns or needs. Patient states she is doing well and does not feel she needs [facility] services right now.     Plan: RN CM will notify [facility] administrative assistant of case closure. RN CM will send MD case closure letter.   [name], RN,BSN,CCM [place] Management Telephonic Care Management Coordinator Direct Phone: [phone] Toll Free: [phone] Fax: [phone]

## 2016-02-12 ENCOUNTER — Other Ambulatory Visit: Payer: Self-pay

## 2016-02-12 NOTE — Patient Outreach (Signed)
[facility]) Care Management  02/12/2016  Colleen Nguyen [birth year] [id number]     Telephone Screen  Referral Date: 02/11/16 Referral Source: Nurse Call Center Referral Reason: "MD increased Zoloft, now won't eat, can't walk, acts like a zombie"    Outreach attempt # 1 to patient. Spoke with patient who consents to speak with dtr. Dtr reports that patient is doing "much better." She contacted MD office and had Zoloft dosage decreased by half. She reports that patient is now "back to her old self." She voices patient is eating, walking and talking. She denies any further questions/needs or concerns at this time. Dtr aware of how to contact Nurse Line if needed again in the future.       Plan: RN CM will notify [facility] administrative assistant of case status.  [name], RN,BSN,CCM [place] Management Telephonic Care Management Coordinator Direct Phone: [phone] Toll Free: [phone] Fax: [phone]

## 2016-02-15 ENCOUNTER — Emergency Department
Admission: EM | Admit: 2016-02-15 | Discharge: 2016-02-15 | Disposition: A | Discharge status: Home / Self Care | Payer: Commercial Managed Care - HMO | Attending: Emergency Medicine | Admitting: Emergency Medicine

## 2016-02-15 DIAGNOSIS — E860 Dehydration: Secondary | ICD-10-CM | POA: Diagnosis not present

## 2016-02-15 DIAGNOSIS — R531 Weakness: Secondary | ICD-10-CM

## 2016-02-15 DIAGNOSIS — I10 Essential (primary) hypertension: Secondary | ICD-10-CM | POA: Diagnosis not present

## 2016-02-15 DIAGNOSIS — Z79899 Other long term (current) drug therapy: Secondary | ICD-10-CM | POA: Diagnosis not present

## 2016-02-15 DIAGNOSIS — R627 Adult failure to thrive: Secondary | ICD-10-CM | POA: Diagnosis present

## 2016-02-15 DIAGNOSIS — R404 Transient alteration of awareness: Secondary | ICD-10-CM | POA: Diagnosis not present

## 2016-02-15 LAB — CBC WITH DIFFERENTIAL/PLATELET
BASOS ABS: 0 10*3/uL (ref 0–0.1)
Basophils Relative: 1 %
EOS PCT: 4 %
Eosinophils Absolute: 0.1 10*3/uL (ref 0–0.7)
HCT: 32.4 % — ABNORMAL LOW (ref 35.0–47.0)
HEMOGLOBIN: 10.7 g/dL — AB (ref 12.0–16.0)
LYMPHS ABS: 1.1 10*3/uL (ref 1.0–3.6)
LYMPHS PCT: 40 %
MCH: 29.7 pg (ref 26.0–34.0)
MCHC: 33.2 g/dL (ref 32.0–36.0)
MCV: 89.4 fL (ref 80.0–100.0)
Monocytes Absolute: 0.6 10*3/uL (ref 0.2–0.9)
Monocytes Relative: 21 %
NEUTROS PCT: 36 %
Neutro Abs: 1 10*3/uL — ABNORMAL LOW (ref 1.4–6.5)
PLATELETS: 155 10*3/uL (ref 150–440)
RBC: 3.62 MIL/uL — AB (ref 3.80–5.20)
RDW: 15.5 % — ABNORMAL HIGH (ref 11.5–14.5)
WBC: 2.7 10*3/uL — AB (ref 3.6–11.0)

## 2016-02-15 LAB — COMPREHENSIVE METABOLIC PANEL
ALK PHOS: 44 U/L (ref 38–126)
ALT: 8 U/L — AB (ref 14–54)
AST: 27 U/L (ref 15–41)
Albumin: 3.4 g/dL — ABNORMAL LOW (ref 3.5–5.0)
Anion gap: 5 (ref 5–15)
BUN: 13 mg/dL (ref 6–20)
CALCIUM: 8.4 mg/dL — AB (ref 8.9–10.3)
CO2: 25 mmol/L (ref 22–32)
CREATININE: 0.85 mg/dL (ref 0.44–1.00)
Chloride: 103 mmol/L (ref 101–111)
GFR calc Af Amer: >60 mL/min (ref >60)
GFR, EST NON AFRICAN AMERICAN: 58 mL/min — AB (ref >60)
Glucose, Bld: 87 mg/dL (ref 65–99)
Potassium: 4 mmol/L (ref 3.5–5.1)
SODIUM: 133 mmol/L — AB (ref 135–145)
Total Bilirubin: 1 mg/dL (ref 0.3–1.2)
Total Protein: 6.3 g/dL — ABNORMAL LOW (ref 6.5–8.1)

## 2016-02-15 LAB — URINALYSIS COMPLETE WITH MICROSCOPIC (ARMC ONLY)
Bacteria, UA: NONE SEEN
Bilirubin Urine: NEGATIVE
Glucose, UA: NEGATIVE mg/dL
Hgb urine dipstick: NEGATIVE
Ketones, ur: NEGATIVE mg/dL
Leukocytes, UA: NEGATIVE
Nitrite: NEGATIVE
PH: 7 (ref 5.0–8.0)
PROTEIN: NEGATIVE mg/dL
RBC / HPF: NONE SEEN RBC/hpf (ref 0–5)
Specific Gravity, Urine: 1.005 (ref 1.005–1.030)
WBC UA: NONE SEEN WBC/hpf (ref 0–5)

## 2016-02-15 LAB — TROPONIN I: Troponin I: <0.03 ng/mL (ref <0.03)

## 2016-02-15 MED ORDER — SODIUM CHLORIDE 0.9 % IV BOLUS (SEPSIS)
500.0000 mL | Freq: Once | INTRAVENOUS | Status: AC
  Administered 2016-02-15: 500 mL via INTRAVENOUS

## 2016-02-15 MED ORDER — SODIUM CHLORIDE 0.9 % IV SOLN
1000.0000 mL | Freq: Once | INTRAVENOUS | Status: AC
  Administered 2016-02-15: 1000 mL via INTRAVENOUS

## 2016-02-15 NOTE — ED Triage Notes (Signed)
Pt from home via EMS, family reports pt lethargic and unable to eat recently. Pt A &O x4

## 2016-02-15 NOTE — ED Provider Notes (Addendum)
[facility] Emergency Department Provider Note        Time seen: ----------------------------------------- 12:57 PM on 02/15/2016 -----------------------------------------    I have reviewed the triage vital signs and the nursing notes.   HISTORY  Chief Complaint Failure To Thrive    HPI Colleen Nguyen is a 82 y.o. female who presents to ER from home by EMS for lethargy and poor by mouth intake. Patient states she has not had an appetite, denies fevers, chills, chest pain, shortness of breath, vomiting or diarrhea. Patient denies recent changes in her symptoms. Patient states she just feels weak currently.   Past Medical History:  Diagnosis Date  . Anemia   . Anxiety   . Atrial fibrillation ([place])   . Hypertension     Patient Active Problem List   Diagnosis Date Noted  . Atrial fibrillation with RVR ([place]) 09/30/2015  . Bradycardia 07/24/2015  . Malnutrition of moderate degree 07/23/2015  . Iron deficiency anemia, unspecified   . Iron deficiency anemia secondary to blood loss (chronic)   . Neoplasm of digestive system   . Acute blood loss anemia 07/22/2015  . GI bleed 07/22/2015  . Weakness generalized 07/22/2015  . Chronic atrial fibrillation ([place]) 07/22/2015    Past Surgical History:  Procedure Laterality Date  . ABDOMINAL HYSTERECTOMY    . APPENDECTOMY    . COLONOSCOPY WITH PROPOFOL N/A 07/23/2015   Procedure: COLONOSCOPY WITH PROPOFOL;  Surgeon: Lucilla Lame, MD;  Location: [facility] ENDOSCOPY;  Service: Endoscopy;  Laterality: N/A;  . ESOPHAGOGASTRODUODENOSCOPY (EGD) WITH PROPOFOL N/A 07/23/2015   Procedure: ESOPHAGOGASTRODUODENOSCOPY (EGD) WITH PROPOFOL;  Surgeon: Lucilla Lame, MD;  Location: [facility] ENDOSCOPY;  Service: Endoscopy;  Laterality: N/A;    Allergies Carisoprodol; Phenergan [promethazine]; Norco [hydrocodone-acetaminophen]; and Pravastatin  Social History Social History  Substance Use Topics  . Smoking status: Never Smoker  .  Smokeless tobacco: Not on file  . Alcohol use No    Review of Systems Constitutional: Negative for fever. Cardiovascular: Negative for chest pain. Respiratory: Negative for shortness of breath. Gastrointestinal: Negative for abdominal pain, vomiting and diarrhea. Genitourinary: Negative for dysuria. Musculoskeletal: Negative for back pain. Skin: Negative for rash. Neurological: Positive for generalized weakness  10-point ROS otherwise negative.  ____________________________________________   PHYSICAL EXAM:  VITAL SIGNS: ED Triage Vitals  Enc Vitals Group     BP 02/15/16 1226 (!) 168/69     Pulse Rate 02/15/16 1231 94     Resp --      Temp 02/15/16 1232 97.4 F (36.3 C)     Temp src --      SpO2 02/15/16 1231 100 %     Weight 02/15/16 1228 100 lb (45.4 kg)     Height 02/15/16 1228 5\' 1"  (1.549 m)     Head Circumference --      Peak Flow --      Pain Score --      Pain Loc --      Pain Edu? --      Excl. in [place]? --     Constitutional: Alert and oriented. Well appearing and in no distress. Eyes: Conjunctivae are normal. PERRL. Normal extraocular movements. ENT   Head: Normocephalic and atraumatic.   Nose: No congestion/rhinnorhea.   Mouth/Throat: Mucous membranes are moist.   Neck: No stridor. Cardiovascular: Slow rate, regular rhythm. No murmurs, rubs, or gallops. Respiratory: Normal respiratory effort without tachypnea nor retractions. Breath sounds are clear and equal bilaterally. No wheezes/rales/rhonchi. Gastrointestinal: Soft and nontender. Normal bowel sounds  Musculoskeletal: Nontender with normal range of motion in all extremities. No lower extremity tenderness nor edema. Neurologic:  Normal speech and language. No gross focal neurologic deficits are appreciated.  Skin:  Skin is warm, dry and intact. No rash noted. Psychiatric: Mood and affect are normal. Speech and behavior are normal.  ____________________________________________  EKG:  Interpreted by me.Sinus bradycardia with a rate of 46 bpm, prolonged PR interval, normal QRS width, normal QT, LVH  ____________________________________________  ED COURSE:  Pertinent labs & imaging results that were available during my care of the patient were reviewed by me and considered in my medical decision making (see chart for details). Clinical Course   Patient presents to the ER with weakness and likely dehydration. She will receive IV fluids and basic labs.  Procedures ____________________________________________   LABS (pertinent positives/negatives)  Labs Reviewed  CBC WITH DIFFERENTIAL/PLATELET - Abnormal; Notable for the following:       Result Value   WBC 2.7 (*)    RBC 3.62 (*)    Hemoglobin 10.7 (*)    HCT 32.4 (*)    RDW 15.5 (*)    Neutro Abs 1.0 (*)    All other components within normal limits  COMPREHENSIVE METABOLIC PANEL - Abnormal; Notable for the following:    Sodium 133 (*)    Calcium 8.4 (*)    Total Protein 6.3 (*)    Albumin 3.4 (*)    ALT 8 (*)    GFR calc non Af Amer 58 (*)    All other components within normal limits  URINALYSIS COMPLETEWITH MICROSCOPIC ([facility] ONLY) - Abnormal; Notable for the following:    Color, Urine STRAW (*)    APPearance CLEAR (*)    Squamous Epithelial / LPF 0-5 (*)    All other components within normal limits  TROPONIN I  CBG MONITORING, ED   ____________________________________________  FINAL ASSESSMENT AND PLAN  Weakness, dehydration  Plan: Patient with labs and imaging as dictated above. Patient's no distress, has eaten here as well as received IV fluid. Family thinks she may have overdone it yesterday with housework, they also report increased stress at home. She has declined hospitalization after numerous recommendations. Family states she has a slow heart rate at baseline. I've advised for the family to bring her back tomorrow if she is no better.   Earleen Newport, MD   Note: This dictation  was prepared with Dragon dictation. Any transcriptional errors that result from this process are unintentional    Earleen Newport, MD 02/15/16 1514    Earleen Newport, MD 02/15/16 [phone]

## 2016-03-05 DIAGNOSIS — I2510 Atherosclerotic heart disease of native coronary artery without angina pectoris: Secondary | ICD-10-CM | POA: Diagnosis not present

## 2016-03-05 DIAGNOSIS — I340 Nonrheumatic mitral (valve) insufficiency: Secondary | ICD-10-CM | POA: Diagnosis not present

## 2016-03-05 DIAGNOSIS — I480 Paroxysmal atrial fibrillation: Secondary | ICD-10-CM | POA: Diagnosis not present

## 2016-04-23 DIAGNOSIS — H353124 Nonexudative age-related macular degeneration, left eye, advanced atrophic with subfoveal involvement: Secondary | ICD-10-CM | POA: Diagnosis not present

## 2016-05-25 ENCOUNTER — Emergency Department: Payer: Medicare HMO

## 2016-05-25 ENCOUNTER — Emergency Department
Admission: EM | Admit: 2016-05-25 | Discharge: 2016-05-25 | Disposition: A | Discharge status: Home / Self Care | Payer: Medicare HMO | Attending: Emergency Medicine | Admitting: Emergency Medicine

## 2016-05-25 ENCOUNTER — Encounter: Payer: Self-pay | Admitting: Emergency Medicine

## 2016-05-25 DIAGNOSIS — Z79899 Other long term (current) drug therapy: Secondary | ICD-10-CM | POA: Insufficient documentation

## 2016-05-25 DIAGNOSIS — R509 Fever, unspecified: Secondary | ICD-10-CM | POA: Diagnosis present

## 2016-05-25 DIAGNOSIS — I10 Essential (primary) hypertension: Secondary | ICD-10-CM | POA: Insufficient documentation

## 2016-05-25 DIAGNOSIS — R05 Cough: Secondary | ICD-10-CM | POA: Diagnosis not present

## 2016-05-25 DIAGNOSIS — J069 Acute upper respiratory infection, unspecified: Secondary | ICD-10-CM

## 2016-05-25 LAB — BASIC METABOLIC PANEL
Anion gap: 10 (ref 5–15)
BUN: 12 mg/dL (ref 6–20)
CALCIUM: 8.9 mg/dL (ref 8.9–10.3)
CO2: 23 mmol/L (ref 22–32)
CREATININE: 0.8 mg/dL (ref 0.44–1.00)
Chloride: 100 mmol/L — ABNORMAL LOW (ref 101–111)
GFR calc non Af Amer: >60 mL/min (ref >60)
Glucose, Bld: 87 mg/dL (ref 65–99)
Potassium: 3.8 mmol/L (ref 3.5–5.1)
SODIUM: 133 mmol/L — AB (ref 135–145)

## 2016-05-25 LAB — CBC WITH DIFFERENTIAL/PLATELET
BASOS PCT: 0 %
Basophils Absolute: 0 10*3/uL (ref 0–0.1)
EOS ABS: 0.1 10*3/uL (ref 0–0.7)
EOS PCT: 2 %
HCT: 30 % — ABNORMAL LOW (ref 35.0–47.0)
HEMOGLOBIN: 10.8 g/dL — AB (ref 12.0–16.0)
Lymphocytes Relative: 20 %
Lymphs Abs: 1 10*3/uL (ref 1.0–3.6)
MCH: 32.4 pg (ref 26.0–34.0)
MCHC: 36.1 g/dL — ABNORMAL HIGH (ref 32.0–36.0)
MCV: 90 fL (ref 80.0–100.0)
Monocytes Absolute: 0.6 10*3/uL (ref 0.2–0.9)
Monocytes Relative: 11 %
NEUTROS PCT: 67 %
Neutro Abs: 3.3 10*3/uL (ref 1.4–6.5)
PLATELETS: 196 10*3/uL (ref 150–440)
RBC: 3.34 MIL/uL — AB (ref 3.80–5.20)
RDW: 14 % (ref 11.5–14.5)
WBC: 4.9 10*3/uL (ref 3.6–11.0)

## 2016-05-25 LAB — TROPONIN I

## 2016-05-25 LAB — INFLUENZA PANEL BY PCR (TYPE A & B)
INFLAPCR: NEGATIVE
Influenza B By PCR: NEGATIVE

## 2016-05-25 NOTE — ED Provider Notes (Signed)
[facility] Emergency Department Provider Note ____________________________________________   I have reviewed the triage vital signs and the triage nursing note.  HISTORY  Chief Complaint Cough and Fever   Historian Patient  HPI Colleen Nguyen is a 83 y.o. female who lives at home with her daughter, states that on Friday she felt some chills, but without any documented fevers, and has had some nasal congestion and mild sore throat/sinus drainage yesterday and today. Her daughter was coming to the emergency department to get evaluated, and so she was going to be evaluated as well.  Chest pain or palpitations. No dizziness or passing out. No focal weakness or numbness. No shortness of breath. Mild occasional cough which she describes as a coming from her throat rather than her lungs.  No abdominal pain or diarrhea.    Past Medical History:  Diagnosis Date  . Anemia   . Anxiety   . Atrial fibrillation ([place])   . Hypertension     Patient Active Problem List   Diagnosis Date Noted  . Atrial fibrillation with RVR ([place]) 09/30/2015  . Bradycardia 07/24/2015  . Malnutrition of moderate degree 07/23/2015  . Iron deficiency anemia, unspecified   . Iron deficiency anemia secondary to blood loss (chronic)   . Neoplasm of digestive system   . Acute blood loss anemia 07/22/2015  . GI bleed 07/22/2015  . Weakness generalized 07/22/2015  . Chronic atrial fibrillation ([place]) 07/22/2015    Past Surgical History:  Procedure Laterality Date  . ABDOMINAL HYSTERECTOMY    . APPENDECTOMY    . COLONOSCOPY WITH PROPOFOL N/A 07/23/2015   Procedure: COLONOSCOPY WITH PROPOFOL;  Surgeon: Lucilla Lame, MD;  Location: [facility] ENDOSCOPY;  Service: Endoscopy;  Laterality: N/A;  . ESOPHAGOGASTRODUODENOSCOPY (EGD) WITH PROPOFOL N/A 07/23/2015   Procedure: ESOPHAGOGASTRODUODENOSCOPY (EGD) WITH PROPOFOL;  Surgeon: Lucilla Lame, MD;  Location: [facility] ENDOSCOPY;  Service: Endoscopy;   Laterality: N/A;    Prior to Admission medications   Medication Sig Start Date End Date Taking? Authorizing Provider  ALPRAZolam Duanne Moron) 0.5 MG tablet Take 0.25-0.5 mg by mouth 2 (two) times daily as needed for anxiety or sleep.     Historical Provider, MD  HYDROcodone-acetaminophen (NORCO/VICODIN) 5-325 MG tablet Take 1 tablet by mouth every 8 (eight) hours as needed for moderate pain.    Historical Provider, MD  lisinopril (PRINIVIL,ZESTRIL) 10 MG tablet Take 10 mg by mouth daily. If blood pressure is high.    Historical Provider, MD  lisinopril-hydrochlorothiazide (PRINZIDE,ZESTORETIC) 20-12.5 MG tablet Take 1 tablet by mouth daily.    Historical Provider, MD  metoprolol (LOPRESSOR) 50 MG tablet Take 25 mg by mouth 2 (two) times daily.  09/27/15   Historical Provider, MD  omeprazole (PRILOSEC) 20 MG capsule Take 20 mg by mouth daily.    Historical Provider, MD  sertraline (ZOLOFT) 50 MG tablet Take 25 mg by mouth daily. 09/06/15   Historical Provider, MD    Allergies  Allergen Reactions  . Carisoprodol Other (See Comments)    Reaction:  Hypersomnolence   . Phenergan [Promethazine] Other (See Comments)    Reaction:  Hallucinations   . Norco [Hydrocodone-Acetaminophen] Itching and Rash  . Pravastatin Rash    Family History  Problem Relation Age of Onset  . Leukemia Mother   . Stroke Father   . CAD Brother     Social History Social History  Substance Use Topics  . Smoking status: Never Smoker  . Smokeless tobacco: Not on file  . Alcohol use No  Review of Systems  Constitutional: Negative for fever.  Subjective chills on Friday, none since. Eyes: Negative for visual changes. ENT: Positive for mild sore throat. Cardiovascular: Negative for chest pain. Respiratory: Negative for shortness of breath. Gastrointestinal: Negative for abdominal pain, vomiting and diarrhea. Genitourinary: Negative for dysuria. Musculoskeletal: Negative for back pain. Skin: Negative for  rash. Neurological: Negative for headache. 10 point Review of Systems otherwise negative ____________________________________________   PHYSICAL EXAM:  VITAL SIGNS: ED Triage Vitals  Enc Vitals Group     BP 05/25/16 1001 (!) 161/55     Pulse Rate 05/25/16 1001 (!) 51     Resp 05/25/16 1001 20     Temp 05/25/16 1001 98.7 F (37.1 C)     Temp Source 05/25/16 1001 Oral     SpO2 05/25/16 1001 99 %     Weight 05/25/16 1002 97 lb (44 kg)     Height 05/25/16 1002 5\' 1"  (1.549 m)     Head Circumference --      Peak Flow --      Pain Score 05/25/16 1002 5     Pain Loc --      Pain Edu? --      Excl. in [place]? --      Constitutional: Alert and oriented. Well appearing and in no distress. HEENT   Head: Normocephalic and atraumatic.      Eyes: Conjunctivae are normal. PERRL. Normal extraocular movements.      Ears:         Nose: No congestion/rhinnorhea.   Mouth/Throat: Mucous membranes are moist.  Very mild pharyngeal erythema. No exudates or swelling or shift.   Neck: No stridor. Cardiovascular/Chest: Tachycardic rate, regular rhythm.  No murmurs, rubs, or gallops. Respiratory: Normal respiratory effort without tachypnea nor retractions. Breath sounds are clear and equal bilaterally. No wheezes/rales/rhonchi. Gastrointestinal: Soft. No distention, no guarding, no rebound. Nontender.  Genitourinary/rectal: Deferred Musculoskeletal: Nontender with normal range of motion in all extremities. No joint effusions.  No lower extremity tenderness.  No edema. Neurologic:  Normal speech and language. No gross or focal neurologic deficits are appreciated. Skin:  Skin is warm, dry and intact. No rash noted. Psychiatric: Mood and affect are normal. Speech and behavior are normal. Patient exhibits appropriate insight and judgment.   ____________________________________________  LABS (pertinent positives/negatives)  Labs Reviewed  BASIC METABOLIC PANEL - Abnormal; Notable for the  following:       Result Value   Sodium 133 (*)    Chloride 100 (*)    All other components within normal limits  CBC WITH DIFFERENTIAL/PLATELET - Abnormal; Notable for the following:    RBC 3.34 (*)    Hemoglobin 10.8 (*)    HCT 30.0 (*)    MCHC 36.1 (*)    All other components within normal limits  INFLUENZA PANEL BY PCR (TYPE A & B)  TROPONIN I    ____________________________________________    EKG I, Lisa Roca, MD, the attending physician have personally viewed and interpreted all ECGs.  50 bpm. normal sinus rhythm. Narrow icterus. Normal axis. Normal ST and T-wave. Q wave anteriorly. ____________________________________________  RADIOLOGY All Xrays were viewed by me. Imaging interpreted by Radiologist.  Chest x-ray two-view: IMPRESSION: Cardiomegaly without evidence of acute cardiopulmonary disease. __________________________________________  PROCEDURES  Procedure(s) performed: None  Critical Care performed: None  ____________________________________________   ED COURSE / ASSESSMENT AND PLAN  Pertinent labs & imaging results that were available during my care of the patient were reviewed by me and  considered in my medical decision making (see chart for details).    Ms. Hervey Ard is overall well-appearing without any respiratory distress. She is not reported fevers and is not febrile here. She doesn't have a significant body aches, but given the fluids so, and I now, I did elect to go ahead and test for this. The only real complaint that she discusses is some sinus drainage and mild sore throat although her throat is overall well-appearing.  I suspect some seasonal allergies or a slight viral upper respiratory infection.  I checked EKG and troponin given the throat pain, and her age to make sure this was not some sort of unusual anginal equivalent. These are reassuring.  It sounds to me as if she decided to get checked out mostly because her family member also  decided to come to the ER to get checked out.  ED evaluation is reassuring. Okay for discharge home. I recommended admission for close follow-up with primary care doctor.    CONSULTATIONS:   None  Patient / Family / Caregiver informed of clinical course, medical decision-making process, and agree with plan.   I discussed return precautions, follow-up instructions, and discharge instructions with patient and/or family.   ___________________________________________   FINAL CLINICAL IMPRESSION(S) / ED DIAGNOSES   Final diagnoses:  Viral upper respiratory tract infection              Note: This dictation was prepared with Dragon dictation. Any transcriptional errors that result from this process are unintentional    Lisa Roca, MD 05/25/16 1315

## 2016-05-25 NOTE — ED Notes (Signed)
FLU swab was completed and sent to the lab for testing.

## 2016-05-25 NOTE — ED Triage Notes (Signed)
Pt c/o cough , congestion with fever since Friday. Pt is in NAD on arrival. Speaks in complete sentences without difficulty.

## 2016-05-25 NOTE — Discharge Instructions (Signed)
You were evaluated for nasal congestion and sore throat, and although no certain cause was found, and your exam and evaluation are reassuring in the emergency department today. As we discussed, I'm most suspicious of a virus or cold.  Return to the emergency department immediately for any worsening trouble breathing, shortness of breath, fever, chest pain, altered mental status, dizziness or passing out, or any other symptoms concerning to you.

## 2016-06-09 DIAGNOSIS — E7800 Pure hypercholesterolemia, unspecified: Secondary | ICD-10-CM | POA: Diagnosis not present

## 2016-06-09 DIAGNOSIS — I10 Essential (primary) hypertension: Secondary | ICD-10-CM | POA: Diagnosis not present

## 2016-06-09 DIAGNOSIS — F419 Anxiety disorder, unspecified: Secondary | ICD-10-CM | POA: Diagnosis not present

## 2016-06-09 DIAGNOSIS — K219 Gastro-esophageal reflux disease without esophagitis: Secondary | ICD-10-CM | POA: Diagnosis not present

## 2016-06-09 DIAGNOSIS — M129 Arthropathy, unspecified: Secondary | ICD-10-CM | POA: Diagnosis not present

## 2016-06-09 DIAGNOSIS — I480 Paroxysmal atrial fibrillation: Secondary | ICD-10-CM | POA: Diagnosis not present

## 2016-06-09 DIAGNOSIS — I712 Thoracic aortic aneurysm, without rupture: Secondary | ICD-10-CM | POA: Diagnosis not present

## 2016-09-17 DIAGNOSIS — I480 Paroxysmal atrial fibrillation: Secondary | ICD-10-CM | POA: Diagnosis not present

## 2016-09-17 DIAGNOSIS — I712 Thoracic aortic aneurysm, without rupture: Secondary | ICD-10-CM | POA: Diagnosis not present

## 2016-09-17 DIAGNOSIS — I10 Essential (primary) hypertension: Secondary | ICD-10-CM | POA: Diagnosis not present

## 2016-09-17 DIAGNOSIS — I2510 Atherosclerotic heart disease of native coronary artery without angina pectoris: Secondary | ICD-10-CM | POA: Diagnosis not present

## 2016-09-17 DIAGNOSIS — N183 Chronic kidney disease, stage 3 (moderate): Secondary | ICD-10-CM | POA: Diagnosis not present

## 2016-09-22 DIAGNOSIS — I480 Paroxysmal atrial fibrillation: Secondary | ICD-10-CM | POA: Diagnosis not present

## 2016-10-16 DIAGNOSIS — I340 Nonrheumatic mitral (valve) insufficiency: Secondary | ICD-10-CM | POA: Diagnosis not present

## 2016-10-16 DIAGNOSIS — I480 Paroxysmal atrial fibrillation: Secondary | ICD-10-CM | POA: Diagnosis not present

## 2016-10-16 DIAGNOSIS — I2510 Atherosclerotic heart disease of native coronary artery without angina pectoris: Secondary | ICD-10-CM | POA: Diagnosis not present

## 2016-10-16 DIAGNOSIS — I351 Nonrheumatic aortic (valve) insufficiency: Secondary | ICD-10-CM | POA: Diagnosis not present

## 2016-12-10 DIAGNOSIS — K219 Gastro-esophageal reflux disease without esophagitis: Secondary | ICD-10-CM | POA: Diagnosis not present

## 2016-12-10 DIAGNOSIS — F419 Anxiety disorder, unspecified: Secondary | ICD-10-CM | POA: Diagnosis not present

## 2016-12-10 DIAGNOSIS — R05 Cough: Secondary | ICD-10-CM | POA: Diagnosis not present

## 2016-12-10 DIAGNOSIS — I10 Essential (primary) hypertension: Secondary | ICD-10-CM | POA: Diagnosis not present

## 2016-12-10 DIAGNOSIS — E7800 Pure hypercholesterolemia, unspecified: Secondary | ICD-10-CM | POA: Diagnosis not present

## 2016-12-10 DIAGNOSIS — M129 Arthropathy, unspecified: Secondary | ICD-10-CM | POA: Diagnosis not present

## 2016-12-10 DIAGNOSIS — I480 Paroxysmal atrial fibrillation: Secondary | ICD-10-CM | POA: Diagnosis not present

## 2017-02-05 ENCOUNTER — Emergency Department: Payer: Medicare HMO

## 2017-02-05 ENCOUNTER — Other Ambulatory Visit: Payer: Self-pay

## 2017-02-05 ENCOUNTER — Encounter: Payer: Self-pay | Admitting: *Deleted

## 2017-02-05 ENCOUNTER — Observation Stay
Admission: EM | Admit: 2017-02-05 | Discharge: 2017-02-06 | Disposition: A | Discharge status: Home / Self Care | Payer: Medicare HMO | Attending: Internal Medicine | Admitting: Internal Medicine

## 2017-02-05 DIAGNOSIS — Z885 Allergy status to narcotic agent status: Secondary | ICD-10-CM | POA: Diagnosis not present

## 2017-02-05 DIAGNOSIS — E860 Dehydration: Secondary | ICD-10-CM | POA: Diagnosis not present

## 2017-02-05 DIAGNOSIS — E871 Hypo-osmolality and hyponatremia: Secondary | ICD-10-CM | POA: Diagnosis not present

## 2017-02-05 DIAGNOSIS — Z79899 Other long term (current) drug therapy: Secondary | ICD-10-CM | POA: Insufficient documentation

## 2017-02-05 DIAGNOSIS — N390 Urinary tract infection, site not specified: Secondary | ICD-10-CM | POA: Diagnosis not present

## 2017-02-05 DIAGNOSIS — I10 Essential (primary) hypertension: Secondary | ICD-10-CM | POA: Diagnosis not present

## 2017-02-05 DIAGNOSIS — R55 Syncope and collapse: Secondary | ICD-10-CM

## 2017-02-05 DIAGNOSIS — I700 Atherosclerosis of aorta: Secondary | ICD-10-CM | POA: Insufficient documentation

## 2017-02-05 DIAGNOSIS — Z888 Allergy status to other drugs, medicaments and biological substances status: Secondary | ICD-10-CM | POA: Diagnosis not present

## 2017-02-05 DIAGNOSIS — K219 Gastro-esophageal reflux disease without esophagitis: Secondary | ICD-10-CM | POA: Diagnosis not present

## 2017-02-05 DIAGNOSIS — N179 Acute kidney failure, unspecified: Secondary | ICD-10-CM | POA: Insufficient documentation

## 2017-02-05 DIAGNOSIS — R531 Weakness: Secondary | ICD-10-CM | POA: Diagnosis not present

## 2017-02-05 DIAGNOSIS — F419 Anxiety disorder, unspecified: Secondary | ICD-10-CM | POA: Diagnosis not present

## 2017-02-05 DIAGNOSIS — E876 Hypokalemia: Secondary | ICD-10-CM | POA: Diagnosis not present

## 2017-02-05 DIAGNOSIS — R42 Dizziness and giddiness: Secondary | ICD-10-CM | POA: Diagnosis not present

## 2017-02-05 DIAGNOSIS — R918 Other nonspecific abnormal finding of lung field: Secondary | ICD-10-CM | POA: Diagnosis not present

## 2017-02-05 DIAGNOSIS — M6281 Muscle weakness (generalized): Secondary | ICD-10-CM | POA: Diagnosis not present

## 2017-02-05 DIAGNOSIS — R262 Difficulty in walking, not elsewhere classified: Secondary | ICD-10-CM | POA: Diagnosis not present

## 2017-02-05 DIAGNOSIS — I4891 Unspecified atrial fibrillation: Secondary | ICD-10-CM | POA: Diagnosis not present

## 2017-02-05 LAB — URINALYSIS, COMPLETE (UACMP) WITH MICROSCOPIC
Bilirubin Urine: NEGATIVE
Glucose, UA: NEGATIVE mg/dL
HGB URINE DIPSTICK: NEGATIVE
Ketones, ur: NEGATIVE mg/dL
Nitrite: POSITIVE — AB
PROTEIN: 30 mg/dL — AB
Specific Gravity, Urine: 1.014 (ref 1.005–1.030)
pH: 6 (ref 5.0–8.0)

## 2017-02-05 LAB — BASIC METABOLIC PANEL
Anion gap: 9 (ref 5–15)
BUN: 25 mg/dL — AB (ref 6–20)
CHLORIDE: 98 mmol/L — AB (ref 101–111)
CO2: 25 mmol/L (ref 22–32)
Calcium: 9.1 mg/dL (ref 8.9–10.3)
Creatinine, Ser: 1.14 mg/dL — ABNORMAL HIGH (ref 0.44–1.00)
GFR calc Af Amer: 47 mL/min — ABNORMAL LOW (ref >60)
GFR calc non Af Amer: 40 mL/min — ABNORMAL LOW (ref >60)
GLUCOSE: 108 mg/dL — AB (ref 65–99)
POTASSIUM: 3.4 mmol/L — AB (ref 3.5–5.1)
Sodium: 132 mmol/L — ABNORMAL LOW (ref 135–145)

## 2017-02-05 LAB — CBC
HEMATOCRIT: 33 % — AB (ref 35.0–47.0)
Hemoglobin: 11.1 g/dL — ABNORMAL LOW (ref 12.0–16.0)
MCH: 31.1 pg (ref 26.0–34.0)
MCHC: 33.7 g/dL (ref 32.0–36.0)
MCV: 92.2 fL (ref 80.0–100.0)
Platelets: 201 10*3/uL (ref 150–440)
RBC: 3.58 MIL/uL — ABNORMAL LOW (ref 3.80–5.20)
RDW: 13.4 % (ref 11.5–14.5)
WBC: 3.6 10*3/uL (ref 3.6–11.0)

## 2017-02-05 LAB — TROPONIN I

## 2017-02-05 MED ORDER — ONDANSETRON HCL 4 MG PO TABS: 4.0000 mg | ORAL_TABLET | Freq: Four times a day (QID) | ORAL | Status: DC | PRN

## 2017-02-05 MED ORDER — PANTOPRAZOLE SODIUM 40 MG PO TBEC
40.0000 mg | DELAYED_RELEASE_TABLET | Freq: Every day | ORAL | Status: DC
  Administered 2017-02-06: 40 mg via ORAL
  Filled 2017-02-05: qty 1

## 2017-02-05 MED ORDER — DEXTROSE 5 % IV SOLN
1.0000 g | INTRAVENOUS | Status: DC
  Filled 2017-02-05: qty 10

## 2017-02-05 MED ORDER — ONDANSETRON HCL 4 MG/2ML IJ SOLN: 4.0000 mg | Freq: Four times a day (QID) | INTRAMUSCULAR | Status: DC | PRN

## 2017-02-05 MED ORDER — SODIUM CHLORIDE 0.9 % IV SOLN
INTRAVENOUS | Status: DC
  Administered 2017-02-05: 23:00:00 via INTRAVENOUS

## 2017-02-05 MED ORDER — CEFTRIAXONE SODIUM IN DEXTROSE 20 MG/ML IV SOLN
1.0000 g | Freq: Once | INTRAVENOUS | Status: AC
  Administered 2017-02-05: 1 g via INTRAVENOUS
  Filled 2017-02-05: qty 50

## 2017-02-05 MED ORDER — HYDROCODONE-ACETAMINOPHEN 5-325 MG PO TABS: 1.0000 | ORAL_TABLET | Freq: Three times a day (TID) | ORAL | Status: DC | PRN

## 2017-02-05 MED ORDER — ALPRAZOLAM 0.25 MG PO TABS
0.2500 mg | ORAL_TABLET | Freq: Two times a day (BID) | ORAL | Status: DC | PRN
  Administered 2017-02-05: 0.5 mg via ORAL
  Filled 2017-02-05: qty 2

## 2017-02-05 MED ORDER — ACETAMINOPHEN 650 MG RE SUPP: 650.0000 mg | Freq: Four times a day (QID) | RECTAL | Status: DC | PRN

## 2017-02-05 MED ORDER — ENOXAPARIN SODIUM 30 MG/0.3ML ~~LOC~~ SOLN
30.0000 mg | SUBCUTANEOUS | Status: DC
  Filled 2017-02-05: qty 0.3

## 2017-02-05 MED ORDER — ACETAMINOPHEN 325 MG PO TABS: 650.0000 mg | ORAL_TABLET | Freq: Four times a day (QID) | ORAL | Status: DC | PRN

## 2017-02-05 MED ORDER — METOPROLOL TARTRATE 25 MG PO TABS
25.0000 mg | ORAL_TABLET | Freq: Two times a day (BID) | ORAL | Status: DC
  Administered 2017-02-05 – 2017-02-06 (×2): 25 mg via ORAL
  Filled 2017-02-05 (×2): qty 1

## 2017-02-05 NOTE — ED Triage Notes (Signed)
Pt c/o sudden onset of generalized weakness starting today. Pt c/o dysuria yesterday, denies today. Pt denies n/v/d and fever at this time. Pt denies CP, c/o shortness of breath that was relieved by O2 administered by EMS en route. Initial call to EMS reported hypotension w/ pressures 90's/40's taken by first responders. Pt had normative pressure for EMS evaluation. Pt removed from O2 during triage and maintained SaO2 in high 90's. Pt in no acute respiratory distress at this time.

## 2017-02-05 NOTE — ED Provider Notes (Signed)
[facility] Emergency Department Provider Note  ____________________________________________   I have reviewed the triage vital signs and the nursing notes.   HISTORY  Chief Complaint Weakness    HPI Colleen Nguyen is a 83 y.o. female she has no complaints at this time.  She states she felt lightheaded earlier but no longer does.  She states she was just sitting there and became unwell feeling.  She has a completely negative review of systems otherwise at this time she states she is back to her baseline, she does notice though that she has had dysuria" smelling" urine recently.      Past Medical History:  Diagnosis Date  . Anemia   . Anxiety   . Atrial fibrillation ([place])   . Hypertension     Patient Active Problem List   Diagnosis Date Noted  . Atrial fibrillation with RVR ([place]) 09/30/2015  . Bradycardia 07/24/2015  . Malnutrition of moderate degree 07/23/2015  . Iron deficiency anemia, unspecified   . Iron deficiency anemia secondary to blood loss (chronic)   . Neoplasm of digestive system   . Acute blood loss anemia 07/22/2015  . GI bleed 07/22/2015  . Weakness generalized 07/22/2015  . Chronic atrial fibrillation ([place]) 07/22/2015    Past Surgical History:  Procedure Laterality Date  . ABDOMINAL HYSTERECTOMY    . APPENDECTOMY      Prior to Admission medications   Medication Sig Start Date End Date Taking? Authorizing Provider  ALPRAZolam Duanne Moron) 0.5 MG tablet Take 0.25-0.5 mg by mouth 2 (two) times daily as needed for anxiety or sleep.     [provider]  HYDROcodone-acetaminophen (NORCO/VICODIN) 5-325 MG tablet Take 1 tablet by mouth every 8 (eight) hours as needed for moderate pain.    [provider]  lisinopril (PRINIVIL,ZESTRIL) 10 MG tablet Take 10 mg by mouth daily. If blood pressure is high.    [provider]  lisinopril-hydrochlorothiazide (PRINZIDE,ZESTORETIC) 20-12.5 MG tablet Take 1 tablet by  mouth daily.    [provider]  metoprolol (LOPRESSOR) 50 MG tablet Take 25 mg by mouth 2 (two) times daily.  09/27/15   [provider]  omeprazole (PRILOSEC) 20 MG capsule Take 20 mg by mouth daily.    [provider]  sertraline (ZOLOFT) 50 MG tablet Take 25 mg by mouth daily. 09/06/15   [provider]    Allergies Carisoprodol; Phenergan [promethazine]; Norco [hydrocodone-acetaminophen]; and Pravastatin  Family History  Problem Relation Age of Onset  . Leukemia Mother   . Stroke Father   . CAD Brother     Social History Social History   Tobacco Use  . Smoking status: Never Smoker  . Smokeless tobacco: Never Used  Substance Use Topics  . Alcohol use: No  . Drug use: No    Review of Systems Constitutional: No fever/chills Eyes: No visual changes. ENT: No sore throat. No stiff neck no neck pain Cardiovascular: Denies chest pain. Respiratory: Denies shortness of breath. Gastrointestinal:   no vomiting.  No diarrhea.  No constipation. Genitourinary: Negative for dysuria. Musculoskeletal: Negative lower extremity swelling Skin: Negative for rash. Neurological: Negative for severe headaches, focal weakness or numbness.   ____________________________________________   PHYSICAL EXAM:  VITAL SIGNS: ED Triage Vitals  Enc Vitals Group     BP 02/05/17 1713 (!) 154/74     Pulse Rate 02/05/17 1713 (!) 54     Resp 02/05/17 1713 20     Temp 02/05/17 1713 (!) 97.5 F (36.4 C)  Temp Source 02/05/17 1713 Oral     SpO2 02/05/17 1713 100 %     Weight 02/05/17 1716 101 lb (45.8 kg)     Height 02/05/17 1716 5' (1.524 m)     Head Circumference --      Peak Flow --      Pain Score 02/05/17 1706 7     Pain Loc --      Pain Edu? --      Excl. in [place]? --     Constitutional: Alert and oriented. Well appearing and in no acute distress. Eyes: Conjunctivae are normal Head: Atraumatic HEENT: No congestion/rhinnorhea. Mucous membranes are  moist.  Oropharynx non-erythematous Neck:   Nontender with no meningismus, no masses, no stridor Cardiovascular: Normal rate, regular rhythm. Grossly normal heart sounds.  Good peripheral circulation. Respiratory: Normal respiratory effort.  No retractions. Lungs CTAB. Abdominal: Soft and nontender. No distention. No guarding no rebound Back:  There is no focal tenderness or step off.  there is no midline tenderness there are no lesions noted. there is no CVA tenderness Musculoskeletal: No lower extremity tenderness, no upper extremity tenderness. No joint effusions, no DVT signs strong distal pulses no edema Neurologic:  Normal speech and language. No gross focal neurologic deficits are appreciated.  Skin:  Skin is warm, dry and intact. No rash noted. Psychiatric: Mood and affect are normal. Speech and behavior are normal.  ____________________________________________   LABS (all labs ordered are listed, but only abnormal results are displayed)  Labs Reviewed  BASIC METABOLIC PANEL  CBC  URINALYSIS, COMPLETE (UACMP) WITH MICROSCOPIC  TROPONIN I  CBG MONITORING, ED    Pertinent labs  results that were available during my care of the patient were reviewed by me and considered in my medical decision making (see chart for details). ____________________________________________  EKG  I personally interpreted any EKGs ordered by me or triage Sinus, bradycardia rate 53 bpm no acute ST elevation or depression normal axis ____________________________________________  RADIOLOGY  Pertinent labs & imaging results that were available during my care of the patient were reviewed by me and considered in my medical decision making (see chart for details). If possible, patient and/or family made aware of any abnormal findings. ____________________________________________    PROCEDURES  Procedure(s) performed: None  Procedures  Critical Care performed:  None  ____________________________________________   INITIAL IMPRESSION / ASSESSMENT AND PLAN / ED COURSE  Pertinent labs & imaging results that were available during my care of the patient were reviewed by me and considered in my medical decision making (see chart for details).  She with no complaints of any variety at this time aside from dysuria presents with a near syncopal episode she felt like she might pass out but did not no trauma, no headache no chest pain.  Patient did apparently have transient low oxygen sats.  No chest pain no dyspnea however, no history of PE or DVT and at this time she has none of those complaints.    ____________________________________________   FINAL CLINICAL IMPRESSION(S) / ED DIAGNOSES  Final diagnoses:  None      This chart was dictated using voice recognition software.  Despite best efforts to proofread,  errors can occur which can change meaning.      Schuyler Amor, MD 02/05/17 [phone]

## 2017-02-05 NOTE — Progress Notes (Signed)
Pt's daughter/pt refused lovenox. Daughter states pt has an aneurysm near her heart. Dr Marcille Blanco notified. Received order to discontinue lovenox, apply TEDS and SCD's

## 2017-02-05 NOTE — H&P (Signed)
[facility] [place] NAME: Colleen Nguyen    MR#:  [id number]  DATE OF BIRTH:  [birth year]  DATE OF ADMISSION:  02/05/2017  PRIMARY CARE PHYSICIAN: Sofie Hartigan, MD   REQUESTING/REFERRING PHYSICIAN: Dr. Charlotte Crumb  CHIEF COMPLAINT:   Chief Complaint  Patient presents with  . Weakness    HISTORY OF PRESENT ILLNESS:  Colleen Nguyen  is a 83 y.o. female with a known history of essential hypertension, anxiety, GERD who presents to the hospital due to generalized weakness. Patient says she has been feeling increasingly weak at home over the past few days. She's had no appetite and also her blood pressure at home was noted to be somewhat low today and therefore she came to the ER for further evaluation. He can also complains of some dysuria, urinary frequency over the past few days in the emergency room patient was noted to be in acute kidney injury would also a urinary tract infection and hospitalist services were contacted further treatment and evaluation. Patient denies any chest pain, shortness of breath, abdominal pain, fever, chills, headache, dizziness or any other associated symptoms presently.  PAST MEDICAL HISTORY:   Past Medical History:  Diagnosis Date  . Anemia   . Anxiety   . Atrial fibrillation ([place])   . Hypertension     PAST SURGICAL HISTORY:   Past Surgical History:  Procedure Laterality Date  . ABDOMINAL HYSTERECTOMY    . APPENDECTOMY      SOCIAL HISTORY:   Social History   Tobacco Use  . Smoking status: Never Smoker  . Smokeless tobacco: Never Used  Substance Use Topics  . Alcohol use: No    FAMILY HISTORY:   Family History  Problem Relation Age of Onset  . Leukemia Mother   . Stroke Father   . CAD Brother     DRUG ALLERGIES:   Allergies  Allergen Reactions  . Carisoprodol Other (See Comments)    Reaction:  Hypersomnolence   . Phenergan [Promethazine] Other (See Comments)    Reaction:   Hallucinations   . Norco [Hydrocodone-Acetaminophen] Itching and Rash  . Pravastatin Rash    REVIEW OF SYSTEMS:   Review of Systems  Constitutional: Negative for chills, fever and weight loss.  HENT: Negative for congestion, nosebleeds and tinnitus.   Eyes: Negative for blurred vision, double vision and redness.  Respiratory: Negative for cough, hemoptysis, shortness of breath and wheezing.   Cardiovascular: Negative for chest pain, orthopnea, leg swelling and PND.  Gastrointestinal: Negative for abdominal pain, diarrhea, melena, nausea and vomiting.  Genitourinary: Positive for dysuria, frequency and urgency. Negative for hematuria.  Musculoskeletal: Negative for falls and joint pain.  Neurological: Positive for weakness. Negative for dizziness, tingling, sensory change, focal weakness, seizures and headaches.  Endo/Heme/Allergies: Negative for polydipsia. Does not bruise/bleed easily.  Psychiatric/Behavioral: Negative for depression and memory loss. The patient is not nervous/anxious.   All other systems reviewed and are negative.   MEDICATIONS AT HOME:   Prior to Admission medications   Medication Sig Start Date End Date Taking? Authorizing Provider  lisinopril-hydrochlorothiazide (PRINZIDE,ZESTORETIC) 20-12.5 MG tablet Take 1 tablet by mouth daily.   Yes [provider]  metoprolol (LOPRESSOR) 50 MG tablet Take 25 mg by mouth 2 (two) times daily.  09/27/15  Yes [provider]  omeprazole (PRILOSEC) 20 MG capsule Take 20 mg by mouth daily.   Yes [provider]  ALPRAZolam Duanne Moron) 0.5 MG tablet Take 0.25-0.5 mg by mouth  2 (two) times daily as needed for anxiety or sleep.     [provider]  HYDROcodone-acetaminophen (NORCO/VICODIN) 5-325 MG tablet Take 1 tablet by mouth every 8 (eight) hours as needed for moderate pain.    [provider]      VITAL SIGNS:  Blood pressure (!) 180/82, pulse 62, temperature (!) 97.5 F (36.4 C),  temperature source Oral, resp. rate 19, height 5' (1.524 m), weight 45.8 kg (101 lb), SpO2 99 %.  PHYSICAL EXAMINATION:  Physical Exam  GENERAL:  83 y.o.-year-old patient lying in the bed in no acute distress.  EYES: Pupils equal, round, reactive to light and accommodation. No scleral icterus. Extraocular muscles intact.  HEENT: Head atraumatic, normocephalic. Oropharynx and nasopharynx clear. No oropharyngeal erythema, moist oral mucosa  NECK:  Supple, no jugular venous distention. No thyroid enlargement, no tenderness.  LUNGS: Normal breath sounds bilaterally, no wheezing, rales, rhonchi. No use of accessory muscles of respiration.  CARDIOVASCULAR: S1, S2 RRR. No murmurs, rubs, gallops, clicks.  ABDOMEN: Soft, nontender, nondistended. Bowel sounds present. No organomegaly or mass.  EXTREMITIES: No pedal edema, cyanosis, or clubbing. + 2 pedal & radial pulses b/l.   NEUROLOGIC: Cranial nerves II through XII are intact. No focal Motor or sensory deficits appreciated b/l. Globally weak PSYCHIATRIC: The patient is alert and oriented x 3.  SKIN: No obvious rash, lesion, or ulcer.   LABORATORY PANEL:   CBC Recent Labs  Lab 02/05/17 1743  WBC 3.6  HGB 11.1*  HCT 33.0*  PLT 201   ------------------------------------------------------------------------------------------------------------------  Chemistries  Recent Labs  Lab 02/05/17 1743  NA 132*  K 3.4*  CL 98*  CO2 25  GLUCOSE 108*  BUN 25*  CREATININE 1.14*  CALCIUM 9.1   ------------------------------------------------------------------------------------------------------------------  Cardiac Enzymes Recent Labs  Lab 02/05/17 1743  TROPONINI <0.03   ------------------------------------------------------------------------------------------------------------------  RADIOLOGY:  Dg Chest 2 View  Result Date: 02/05/2017 CLINICAL DATA:  Weakness EXAM: CHEST  2 VIEW COMPARISON:  05/25/2016, 01/05/2015, 06/03/2008  FINDINGS: Hyperinflation with chronic scarring or thickening at the right CP angle. Possible pleural-based nodule at the right base versus superimposed nipple shadow. Mild cardiomegaly. Aortic atherosclerosis. No pneumothorax. IMPRESSION: 1. Hyperinflation.  No acute infiltrate or edema 2. Mild cardiomegaly 3. Nodular opacity at the right lung base, may reflect pleural based lung nodule versus superimposed nipple shadow. Short interval radiographic follow-up with nipple marker suggested; alternatively CT could be considered. Electronically Signed   By: Donavan Foil M.D.   On: 02/05/2017 18:51     IMPRESSION AND PLAN:   83 year old female with past medical history of anxiety, essential hypertension, GERD who presents to the hospital due to generalized weakness with urinary frequency, urgency and noted to have urinary tract infection with acute kidney injury.  1. Urinary tract infection-this is the cause of patient's generalized weakness. We'll treat the patient with IV ceftriaxone, follow urine cultures. Patient is clinically afebrile and hemodynamic stable.  2. Acute kidney injury-secondary to poor by mouth intake and also dehydration. -we will hydrate the patient with IV fluids, follow BUN and creatinine and urine output. Hold lisinopril/HCTZ.  3. Generalized weakness-secondary to #1 and 2. Continue IV antibiotics, IV fluids, we'll get a physical therapy consult to assess mobility.  4. Essential HTN - cont. Metoprolol. Hold HCTZ/Lisinopril due to ARF.   5. GERD - cont. Protonix.     All the records are reviewed and case discussed with ED provider. Management plans discussed with the patient, family and they are in agreement.  CODE STATUS: Full code  TOTAL TIME TAKING CARE OF THIS PATIENT: 45 minutes.    Henreitta Leber M.D on 02/05/2017 at 7:45 PM  Between 7am to 6pm - Pager - [phone]  After 6pm go to [email or web address] - password [email or web address] [facility]  [place] [facility]  Office   [phone]  CC: Primary care physician; Sofie Hartigan, MD

## 2017-02-06 DIAGNOSIS — R531 Weakness: Secondary | ICD-10-CM | POA: Diagnosis not present

## 2017-02-06 DIAGNOSIS — I10 Essential (primary) hypertension: Secondary | ICD-10-CM | POA: Diagnosis not present

## 2017-02-06 DIAGNOSIS — N390 Urinary tract infection, site not specified: Secondary | ICD-10-CM | POA: Diagnosis not present

## 2017-02-06 DIAGNOSIS — N179 Acute kidney failure, unspecified: Secondary | ICD-10-CM | POA: Diagnosis not present

## 2017-02-06 LAB — CBC
HCT: 31.7 % — ABNORMAL LOW (ref 35.0–47.0)
Hemoglobin: 10.9 g/dL — ABNORMAL LOW (ref 12.0–16.0)
MCH: 31.2 pg (ref 26.0–34.0)
MCHC: 34.2 g/dL (ref 32.0–36.0)
MCV: 91.2 fL (ref 80.0–100.0)
PLATELETS: 193 10*3/uL (ref 150–440)
RBC: 3.48 MIL/uL — AB (ref 3.80–5.20)
RDW: 13.1 % (ref 11.5–14.5)
WBC: 3.4 10*3/uL — ABNORMAL LOW (ref 3.6–11.0)

## 2017-02-06 LAB — BASIC METABOLIC PANEL
Anion gap: 7 (ref 5–15)
BUN: 19 mg/dL (ref 6–20)
CALCIUM: 8.7 mg/dL — AB (ref 8.9–10.3)
CO2: 24 mmol/L (ref 22–32)
CREATININE: 0.82 mg/dL (ref 0.44–1.00)
Chloride: 102 mmol/L (ref 101–111)
GFR calc non Af Amer: >60 mL/min (ref >60)
GLUCOSE: 82 mg/dL (ref 65–99)
Potassium: 2.9 mmol/L — ABNORMAL LOW (ref 3.5–5.1)
Sodium: 133 mmol/L — ABNORMAL LOW (ref 135–145)

## 2017-02-06 LAB — MAGNESIUM: Magnesium: 1.7 mg/dL (ref 1.7–2.4)

## 2017-02-06 MED ORDER — POTASSIUM CHLORIDE CRYS ER 20 MEQ PO TBCR
40.0000 meq | EXTENDED_RELEASE_TABLET | ORAL | Status: AC
  Administered 2017-02-06 (×2): 40 meq via ORAL
  Filled 2017-02-06 (×2): qty 2

## 2017-02-06 MED ORDER — LISINOPRIL 20 MG PO TABS
20.0000 mg | ORAL_TABLET | Freq: Every day | ORAL | Status: DC
  Administered 2017-02-06: 20 mg via ORAL
  Filled 2017-02-06: qty 1

## 2017-02-06 MED ORDER — MAGNESIUM SULFATE 2 GM/50ML IV SOLN
2.0000 g | Freq: Once | INTRAVENOUS | Status: AC
  Administered 2017-02-06: 2 g via INTRAVENOUS
  Filled 2017-02-06: qty 50

## 2017-02-06 MED ORDER — CEPHALEXIN 500 MG PO CAPS: 500.0000 mg | ORAL_CAPSULE | Freq: Two times a day (BID) | ORAL | 0 refills | Status: DC

## 2017-02-06 MED ORDER — HYDRALAZINE HCL 20 MG/ML IJ SOLN
10.0000 mg | Freq: Four times a day (QID) | INTRAMUSCULAR | Status: DC | PRN
Start: 2017-02-06 — End: 2017-02-06

## 2017-02-06 NOTE — Care Management Note (Signed)
Case Management Note  Patient Details  Name: Colleen Nguyen MRN: [id number] Date of Birth: 12/20/1922  Subjective/Objective:                   Met with patient and her daughter [name] regarding discharge planning. Patient lives with [name]. PT is recommending home health PT and [name] states [facility] has had [facility] in the past and would like again. Per [name] patient has a walker that she uses at baseline.  Action/Plan:   Referral to [facility]. No other RNCM needs.   Expected Discharge Date:  02/06/17               Expected Discharge Plan:     In-House Referral:     Discharge planning Services  CM Consult  Post Acute Care Choice:  Home Health Choice offered to:  Patient, Adult Children  DME Arranged:    DME Agency:     HH Arranged:  PT [place]:  [place]  Status of Service:  Completed, signed off  If discussed at [place] of Stay Meetings, dates discussed:    Additional Comments:  Marshell Garfinkel, RN 02/06/2017, 11:45 AM

## 2017-02-06 NOTE — Discharge Summary (Signed)
[facility] [place] NAME: Colleen Nguyen    MR#:  [id number]  DATE OF BIRTH:  [birth year]  DATE OF ADMISSION:  02/05/2017   ADMITTING PHYSICIAN: Henreitta Leber, MD  DATE OF DISCHARGE:  02/06/2017  PRIMARY CARE PHYSICIAN: Sofie Hartigan, MD   ADMISSION DIAGNOSIS:  Weakness [R53.1] Near syncope [R55] Urinary tract infection without hematuria, site unspecified [N39.0] DISCHARGE DIAGNOSIS:  Active Problems:   UTI (urinary tract infection)  SECONDARY DIAGNOSIS:   Past Medical History:  Diagnosis Date  . Anemia   . Anxiety   . Atrial fibrillation ([place])   . Hypertension    HOSPITAL COURSE:   83 year old female with past medical history of anxiety, essential hypertension, GERD who presents to the hospital due to generalized weakness with urinary frequency, urgency and noted to have urinary tract infection with acute kidney injury.  1. Urinary tract infection-this is the cause of patient's generalized weakness. on IV ceftriaxone, follow urine cultures as outpatient. Change to Keflex p.o. for 3 more days.  2. Acute kidney injury-secondary to poor by mouth intake and also dehydration. Improving with with IV fluids, resume lisinopril/HCTZ.  3. Generalized weakness-secondary to #1 and 2. PT evaluation such as home PT.  4. Essential HTN - cont. Metoprolol.  Resume HCTZ/Lisinopril.  5. GERD - cont. Protonix.   Hypokalemia.  Give potassium supplement.  Follow-up level as outpatient. Hyponatremia.  Encourage oral intake.  Follow-up BMP as outpatient.  DISCHARGE CONDITIONS:  Stable, discharged to home with home PT today. CONSULTS OBTAINED:   DRUG ALLERGIES:   Allergies  Allergen Reactions  . Carisoprodol Other (See Comments)    Reaction:  Hypersomnolence   . Phenergan [Promethazine] Other (See Comments)    Reaction:  Hallucinations   . Norco [Hydrocodone-Acetaminophen] Itching and Rash  . Pravastatin Rash    DISCHARGE MEDICATIONS:   Allergies as of 02/06/2017      Reactions   Carisoprodol Other (See Comments)   Reaction:  Hypersomnolence    Phenergan [promethazine] Other (See Comments)   Reaction:  Hallucinations    Norco [hydrocodone-acetaminophen] Itching, Rash   Pravastatin Rash      Medication List    TAKE these medications   ALPRAZolam 0.5 MG tablet Commonly known as:  XANAX Take 0.25-0.5 mg by mouth 2 (two) times daily as needed for anxiety or sleep.   cephALEXin 500 MG capsule Commonly known as:  KEFLEX Take 1 capsule (500 mg total) 2 (two) times daily by mouth.   HYDROcodone-acetaminophen 5-325 MG tablet Commonly known as:  NORCO/VICODIN Take 1 tablet by mouth every 8 (eight) hours as needed for moderate pain.   lisinopril-hydrochlorothiazide 20-12.5 MG tablet Commonly known as:  PRINZIDE,ZESTORETIC Take 1 tablet by mouth daily.   metoprolol tartrate 50 MG tablet Commonly known as:  LOPRESSOR Take 25 mg by mouth 2 (two) times daily.   omeprazole 20 MG capsule Commonly known as:  PRILOSEC Take 20 mg by mouth daily.        DISCHARGE INSTRUCTIONS:  See AVS.  If you experience worsening of your admission symptoms, develop shortness of breath, life threatening emergency, suicidal or homicidal thoughts you must seek medical attention immediately by calling 911 or calling your MD immediately  if symptoms less severe.  You Must read complete instructions/literature along with all the possible adverse reactions/side effects for all the Medicines you take and that have been prescribed to you. Take any new Medicines after you have completely understood and accpet all  the possible adverse reactions/side effects.   Please note  You were cared for by a hospitalist during your hospital stay. If you have any questions about your discharge medications or the care you received while you were in the hospital after you are discharged, you can call the unit and asked to speak  with the hospitalist on call if the hospitalist that took care of you is not available. Once you are discharged, your primary care physician will handle any further medical issues. Please note that NO REFILLS for any discharge medications will be authorized once you are discharged, as it is imperative that you return to your primary care physician (or establish a relationship with a primary care physician if you do not have one) for your aftercare needs so that they can reassess your need for medications and monitor your lab values.    On the day of Discharge:  VITAL SIGNS:  Blood pressure (!) 149/64, pulse (!) 59, temperature 98.9 F (37.2 C), temperature source Oral, resp. rate 17, height 5' (1.524 m), weight 102 lb 14.4 oz (46.7 kg), SpO2 98 %. PHYSICAL EXAMINATION:  GENERAL:  83 y.o.-year-old patient lying in the bed with no acute distress.  EYES: Pupils equal, round, reactive to light and accommodation. No scleral icterus. Extraocular muscles intact.  HEENT: Head atraumatic, normocephalic. Oropharynx and nasopharynx clear.  NECK:  Supple, no jugular venous distention. No thyroid enlargement, no tenderness.  LUNGS: Normal breath sounds bilaterally, no wheezing, rales,rhonchi or crepitation. No use of accessory muscles of respiration.  CARDIOVASCULAR: S1, S2 normal. No murmurs, rubs, or gallops.  ABDOMEN: Soft, non-tender, non-distended. Bowel sounds present. No organomegaly or mass.  EXTREMITIES: No pedal edema, cyanosis, or clubbing.  NEUROLOGIC: Cranial nerves II through XII are intact. Muscle strength 4/5 in all extremities. Sensation intact. Gait not checked.  PSYCHIATRIC: The patient is alert and oriented x 3.  SKIN: No obvious rash, lesion, or ulcer.  DATA REVIEW:   CBC Recent Labs  Lab 02/06/17 0519  WBC 3.4*  HGB 10.9*  HCT 31.7*  PLT 193    Chemistries  Recent Labs  Lab 02/06/17 0519  NA 133*  K 2.9*  CL 102  CO2 24  GLUCOSE 82  BUN 19  CREATININE 0.82  CALCIUM  8.7*  MG 1.7     Microbiology Results  No results found for this or any previous visit.  RADIOLOGY:  Dg Chest 2 View  Result Date: 02/05/2017 CLINICAL DATA:  Weakness EXAM: CHEST  2 VIEW COMPARISON:  05/25/2016, 01/05/2015, 06/03/2008 FINDINGS: Hyperinflation with chronic scarring or thickening at the right CP angle. Possible pleural-based nodule at the right base versus superimposed nipple shadow. Mild cardiomegaly. Aortic atherosclerosis. No pneumothorax. IMPRESSION: 1. Hyperinflation.  No acute infiltrate or edema 2. Mild cardiomegaly 3. Nodular opacity at the right lung base, may reflect pleural based lung nodule versus superimposed nipple shadow. Short interval radiographic follow-up with nipple marker suggested; alternatively CT could be considered. Electronically Signed   By: Donavan Foil M.D.   On: 02/05/2017 18:51     Management plans discussed with the patient, her daughter and they are in agreement.  CODE STATUS: Full Code   TOTAL TIME TAKING CARE OF THIS PATIENT: 33 minutes.    Demetrios Loll M.D on 02/06/2017 at 1:44 PM  Between 7am to 6pm - Pager - [phone]  After 6pm go to [email or web address] - [place] [facility]  Office  [phone]  CC: Primary care physician; Sofie Hartigan,  MD   Note: This dictation was prepared with Dragon dictation along with smaller phrase technology. Any transcriptional errors that result from this process are unintentional.

## 2017-02-06 NOTE — Discharge Instructions (Signed)
-   Fall precaution 

## 2017-02-06 NOTE — Progress Notes (Signed)
Patient is being discharged home today with [facility] for PT. Patient lives with daughter, Colleen Nguyen. DC & RX instructions given and patient and family acknowledged understanding. IV removed.  NT to prepare patient for transport with daughter. Belongings packed.

## 2017-02-06 NOTE — Evaluation (Signed)
Physical Therapy Evaluation Patient Details Name: Colleen Nguyen MRN: [id number] DOB: [birth year] Today's Date: 02/06/2017   History of Present Illness  83 year old female with past medical history of anxiety, essential hypertension, GERD who presents to the hospital due to generalized weakness with urinary frequency, urgency and noted to have urinary tract infection with acute kidney injury.  Clinical Impression  Pt showed good effort with bed mobility and small bout of ambulation that she and family report is not far from her baseline.  She had had HHPT in the past and reports that she felt she made good functional gains with that. She is weak and limited but with her level of assist she should be able to safety go home with daughter, PT recommending HHPT.    Follow Up Recommendations Home health PT    Equipment Recommendations       Recommendations for Other Services       Precautions / Restrictions Precautions Precautions: Fall Restrictions Weight Bearing Restrictions: No      Mobility  Bed Mobility Overal bed mobility: Independent                Transfers Overall transfer level: Modified independent Equipment used: Rolling walker (2 wheeled)             General transfer comment: Pt able to rise to standing w/o direct assist  Ambulation/Gait Ambulation/Gait assistance: Supervision Ambulation Distance (Feet): 60 Feet Assistive device: Rolling walker (2 wheeled)       General Gait Details: Pt walks with slow, but safe, cadence for in-home distance  Stairs            Wheelchair Mobility    Modified Rankin (Stroke Patients Only)       Balance Overall balance assessment: Modified Independent                                           Pertinent Vitals/Pain Pain Assessment: No/denies pain    Home Living Family/patient expects to be discharged to:: Private residence Living Arrangements: Children Available Help at  Discharge: Family   Home Access: Stairs to enter Entrance Stairs-Rails: Can reach both Entrance Stairs-Number of Steps: 6 Home Layout: One level Home Equipment: Walker - 4 wheels      Prior Function Level of Independence: Needs assistance         Comments: Only leaves home for MD appts, needs some assist with bathing/dressing     Hand Dominance        Extremity/Trunk Assessment   Upper Extremity Assessment Upper Extremity Assessment: Generalized weakness(age appropriate limitations)    Lower Extremity Assessment Lower Extremity Assessment: Generalized weakness(age appropriate limitations)       Communication   Communication: No difficulties  Cognition Arousal/Alertness: Awake/alert Behavior During Therapy: WFL for tasks assessed/performed Overall Cognitive Status: Within Functional Limits for tasks assessed                                        General Comments      Exercises     Assessment/Plan    PT Assessment Patient needs continued PT services  PT Problem List Decreased strength;Decreased range of motion;Decreased activity tolerance;Decreased mobility;Decreased balance;Decreased knowledge of use of DME;Decreased safety awareness       PT Treatment Interventions DME instruction;Gait training;Stair training;Functional mobility  training;Therapeutic activities;Therapeutic exercise;Balance training;Neuromuscular re-education;Patient/family education    PT Goals (Current goals can be found in the Care Plan section)  Acute Rehab PT Goals Patient Stated Goal: go home PT Goal Formulation: With patient Time For Goal Achievement: 02/20/17 Potential to Achieve Goals: Good    Frequency Min 2X/week   Barriers to discharge        Co-evaluation               AM-PAC PT "6 Clicks" Daily Activity  Outcome Measure Difficulty turning over in bed (including adjusting bedclothes, sheets and blankets)?: None Difficulty moving from lying on  back to sitting on the side of the bed? : None Difficulty sitting down on and standing up from a chair with arms (e.g., wheelchair, bedside commode, etc,.)?: A Little Help needed moving to and from a bed to chair (including a wheelchair)?: None Help needed walking in hospital room?: None Help needed climbing 3-5 steps with a railing? : A Little 6 Click Score: 22    End of Session Equipment Utilized During Treatment: Gait belt Activity Tolerance: Patient tolerated treatment well Patient left: with call bell/phone within reach;with bed alarm set;with family/visitor present   PT Visit Diagnosis: Muscle weakness (generalized) (M62.81);Difficulty in walking, not elsewhere classified (R26.2)    Time: 6568-1275 PT Time Calculation (min) (ACUTE ONLY): 29 min   Charges:   PT Evaluation $PT Eval Low Complexity: 1 Low     PT G Codes:   PT G-Codes **NOT FOR INPATIENT CLASS** Functional Assessment Tool Used: AM-PAC 6 Clicks Basic Mobility Functional Limitation: Mobility: Walking and moving around Mobility: Walking and Moving Around Current Status (T7001): At least 20 percent but less than 40 percent impaired, limited or restricted Mobility: Walking and Moving Around Goal Status [phone]): At least 1 percent but less than 20 percent impaired, limited or restricted    Kreg Shropshire, DPT 02/06/2017, 2:31 PM

## 2017-02-06 NOTE — Care Management Obs Status (Signed)
[facility] NOTIFICATION   Patient Details  Name: Colleen Nguyen MRN: [id number] Date of Birth: 1922-09-12   Medicare Observation Status Notification Given:  Yes  Signed by patient's daughter Colleen Nguyen per patient's request.   Marshell Garfinkel, RN 02/06/2017, 11:09 AM

## 2017-02-08 LAB — URINE CULTURE

## 2017-02-09 DIAGNOSIS — D649 Anemia, unspecified: Secondary | ICD-10-CM | POA: Diagnosis not present

## 2017-02-09 DIAGNOSIS — I4891 Unspecified atrial fibrillation: Secondary | ICD-10-CM | POA: Diagnosis not present

## 2017-02-09 DIAGNOSIS — K219 Gastro-esophageal reflux disease without esophagitis: Secondary | ICD-10-CM | POA: Diagnosis not present

## 2017-02-09 DIAGNOSIS — Z87440 Personal history of urinary (tract) infections: Secondary | ICD-10-CM | POA: Diagnosis not present

## 2017-02-09 DIAGNOSIS — I10 Essential (primary) hypertension: Secondary | ICD-10-CM | POA: Diagnosis not present

## 2017-02-09 DIAGNOSIS — F419 Anxiety disorder, unspecified: Secondary | ICD-10-CM | POA: Diagnosis not present

## 2017-02-09 DIAGNOSIS — M6281 Muscle weakness (generalized): Secondary | ICD-10-CM | POA: Diagnosis not present

## 2017-02-11 DIAGNOSIS — K219 Gastro-esophageal reflux disease without esophagitis: Secondary | ICD-10-CM | POA: Diagnosis not present

## 2017-02-11 DIAGNOSIS — F419 Anxiety disorder, unspecified: Secondary | ICD-10-CM | POA: Diagnosis not present

## 2017-02-11 DIAGNOSIS — I4891 Unspecified atrial fibrillation: Secondary | ICD-10-CM | POA: Diagnosis not present

## 2017-02-11 DIAGNOSIS — I10 Essential (primary) hypertension: Secondary | ICD-10-CM | POA: Diagnosis not present

## 2017-02-11 DIAGNOSIS — Z87440 Personal history of urinary (tract) infections: Secondary | ICD-10-CM | POA: Diagnosis not present

## 2017-02-11 DIAGNOSIS — D649 Anemia, unspecified: Secondary | ICD-10-CM | POA: Diagnosis not present

## 2017-02-11 DIAGNOSIS — M6281 Muscle weakness (generalized): Secondary | ICD-10-CM | POA: Diagnosis not present

## 2017-02-16 DIAGNOSIS — M6281 Muscle weakness (generalized): Secondary | ICD-10-CM | POA: Diagnosis not present

## 2017-02-16 DIAGNOSIS — Z87440 Personal history of urinary (tract) infections: Secondary | ICD-10-CM | POA: Diagnosis not present

## 2017-02-16 DIAGNOSIS — F419 Anxiety disorder, unspecified: Secondary | ICD-10-CM | POA: Diagnosis not present

## 2017-02-16 DIAGNOSIS — I10 Essential (primary) hypertension: Secondary | ICD-10-CM | POA: Diagnosis not present

## 2017-02-16 DIAGNOSIS — K219 Gastro-esophageal reflux disease without esophagitis: Secondary | ICD-10-CM | POA: Diagnosis not present

## 2017-02-16 DIAGNOSIS — D649 Anemia, unspecified: Secondary | ICD-10-CM | POA: Diagnosis not present

## 2017-02-16 DIAGNOSIS — I4891 Unspecified atrial fibrillation: Secondary | ICD-10-CM | POA: Diagnosis not present

## 2017-02-17 DIAGNOSIS — J90 Pleural effusion, not elsewhere classified: Secondary | ICD-10-CM | POA: Diagnosis not present

## 2017-02-17 DIAGNOSIS — R9389 Abnormal findings on diagnostic imaging of other specified body structures: Secondary | ICD-10-CM | POA: Diagnosis not present

## 2017-02-17 DIAGNOSIS — N390 Urinary tract infection, site not specified: Secondary | ICD-10-CM | POA: Diagnosis not present

## 2017-02-17 DIAGNOSIS — E871 Hypo-osmolality and hyponatremia: Secondary | ICD-10-CM | POA: Diagnosis not present

## 2017-02-17 DIAGNOSIS — R531 Weakness: Secondary | ICD-10-CM | POA: Diagnosis not present

## 2017-02-17 DIAGNOSIS — Z09 Encounter for follow-up examination after completed treatment for conditions other than malignant neoplasm: Secondary | ICD-10-CM | POA: Diagnosis not present

## 2017-02-17 DIAGNOSIS — E876 Hypokalemia: Secondary | ICD-10-CM | POA: Diagnosis not present

## 2017-02-18 DIAGNOSIS — D649 Anemia, unspecified: Secondary | ICD-10-CM | POA: Diagnosis not present

## 2017-02-18 DIAGNOSIS — K219 Gastro-esophageal reflux disease without esophagitis: Secondary | ICD-10-CM | POA: Diagnosis not present

## 2017-02-18 DIAGNOSIS — F419 Anxiety disorder, unspecified: Secondary | ICD-10-CM | POA: Diagnosis not present

## 2017-02-18 DIAGNOSIS — Z87440 Personal history of urinary (tract) infections: Secondary | ICD-10-CM | POA: Diagnosis not present

## 2017-02-18 DIAGNOSIS — I4891 Unspecified atrial fibrillation: Secondary | ICD-10-CM | POA: Diagnosis not present

## 2017-02-18 DIAGNOSIS — M6281 Muscle weakness (generalized): Secondary | ICD-10-CM | POA: Diagnosis not present

## 2017-02-18 DIAGNOSIS — I10 Essential (primary) hypertension: Secondary | ICD-10-CM | POA: Diagnosis not present

## 2017-02-26 DIAGNOSIS — F419 Anxiety disorder, unspecified: Secondary | ICD-10-CM | POA: Diagnosis not present

## 2017-02-26 DIAGNOSIS — K219 Gastro-esophageal reflux disease without esophagitis: Secondary | ICD-10-CM | POA: Diagnosis not present

## 2017-02-26 DIAGNOSIS — I10 Essential (primary) hypertension: Secondary | ICD-10-CM | POA: Diagnosis not present

## 2017-02-26 DIAGNOSIS — I4891 Unspecified atrial fibrillation: Secondary | ICD-10-CM | POA: Diagnosis not present

## 2017-02-26 DIAGNOSIS — D649 Anemia, unspecified: Secondary | ICD-10-CM | POA: Diagnosis not present

## 2017-02-26 DIAGNOSIS — Z87440 Personal history of urinary (tract) infections: Secondary | ICD-10-CM | POA: Diagnosis not present

## 2017-02-26 DIAGNOSIS — M6281 Muscle weakness (generalized): Secondary | ICD-10-CM | POA: Diagnosis not present

## 2017-02-27 DIAGNOSIS — Z87440 Personal history of urinary (tract) infections: Secondary | ICD-10-CM | POA: Diagnosis not present

## 2017-02-27 DIAGNOSIS — I10 Essential (primary) hypertension: Secondary | ICD-10-CM | POA: Diagnosis not present

## 2017-02-27 DIAGNOSIS — F419 Anxiety disorder, unspecified: Secondary | ICD-10-CM | POA: Diagnosis not present

## 2017-02-27 DIAGNOSIS — D649 Anemia, unspecified: Secondary | ICD-10-CM | POA: Diagnosis not present

## 2017-02-27 DIAGNOSIS — K219 Gastro-esophageal reflux disease without esophagitis: Secondary | ICD-10-CM | POA: Diagnosis not present

## 2017-02-27 DIAGNOSIS — M6281 Muscle weakness (generalized): Secondary | ICD-10-CM | POA: Diagnosis not present

## 2017-02-27 DIAGNOSIS — I4891 Unspecified atrial fibrillation: Secondary | ICD-10-CM | POA: Diagnosis not present

## 2017-03-03 DIAGNOSIS — F419 Anxiety disorder, unspecified: Secondary | ICD-10-CM | POA: Diagnosis not present

## 2017-03-03 DIAGNOSIS — K219 Gastro-esophageal reflux disease without esophagitis: Secondary | ICD-10-CM | POA: Diagnosis not present

## 2017-03-03 DIAGNOSIS — Z87440 Personal history of urinary (tract) infections: Secondary | ICD-10-CM | POA: Diagnosis not present

## 2017-03-03 DIAGNOSIS — I4891 Unspecified atrial fibrillation: Secondary | ICD-10-CM | POA: Diagnosis not present

## 2017-03-03 DIAGNOSIS — D649 Anemia, unspecified: Secondary | ICD-10-CM | POA: Diagnosis not present

## 2017-03-03 DIAGNOSIS — M6281 Muscle weakness (generalized): Secondary | ICD-10-CM | POA: Diagnosis not present

## 2017-03-03 DIAGNOSIS — I10 Essential (primary) hypertension: Secondary | ICD-10-CM | POA: Diagnosis not present

## 2017-03-11 DIAGNOSIS — I10 Essential (primary) hypertension: Secondary | ICD-10-CM | POA: Diagnosis not present

## 2017-03-11 DIAGNOSIS — I4891 Unspecified atrial fibrillation: Secondary | ICD-10-CM | POA: Diagnosis not present

## 2017-03-11 DIAGNOSIS — M6281 Muscle weakness (generalized): Secondary | ICD-10-CM | POA: Diagnosis not present

## 2017-03-11 DIAGNOSIS — F419 Anxiety disorder, unspecified: Secondary | ICD-10-CM | POA: Diagnosis not present

## 2017-03-11 DIAGNOSIS — Z87440 Personal history of urinary (tract) infections: Secondary | ICD-10-CM | POA: Diagnosis not present

## 2017-03-11 DIAGNOSIS — D649 Anemia, unspecified: Secondary | ICD-10-CM | POA: Diagnosis not present

## 2017-03-11 DIAGNOSIS — K219 Gastro-esophageal reflux disease without esophagitis: Secondary | ICD-10-CM | POA: Diagnosis not present

## 2017-03-19 DIAGNOSIS — I10 Essential (primary) hypertension: Secondary | ICD-10-CM | POA: Diagnosis not present

## 2017-03-19 DIAGNOSIS — R918 Other nonspecific abnormal finding of lung field: Secondary | ICD-10-CM | POA: Diagnosis not present

## 2017-03-19 DIAGNOSIS — R001 Bradycardia, unspecified: Secondary | ICD-10-CM | POA: Diagnosis not present

## 2017-03-19 DIAGNOSIS — I4891 Unspecified atrial fibrillation: Secondary | ICD-10-CM | POA: Diagnosis not present

## 2017-03-19 DIAGNOSIS — N183 Chronic kidney disease, stage 3 (moderate): Secondary | ICD-10-CM | POA: Diagnosis not present

## 2017-03-19 DIAGNOSIS — I2510 Atherosclerotic heart disease of native coronary artery without angina pectoris: Secondary | ICD-10-CM | POA: Diagnosis not present

## 2017-04-01 DIAGNOSIS — I480 Paroxysmal atrial fibrillation: Secondary | ICD-10-CM | POA: Diagnosis not present

## 2017-04-14 DIAGNOSIS — I480 Paroxysmal atrial fibrillation: Secondary | ICD-10-CM | POA: Diagnosis not present

## 2017-04-14 DIAGNOSIS — I2510 Atherosclerotic heart disease of native coronary artery without angina pectoris: Secondary | ICD-10-CM | POA: Diagnosis not present

## 2017-06-15 DIAGNOSIS — M129 Arthropathy, unspecified: Secondary | ICD-10-CM | POA: Diagnosis not present

## 2017-06-15 DIAGNOSIS — I480 Paroxysmal atrial fibrillation: Secondary | ICD-10-CM | POA: Diagnosis not present

## 2017-06-15 DIAGNOSIS — J310 Chronic rhinitis: Secondary | ICD-10-CM | POA: Diagnosis not present

## 2017-06-15 DIAGNOSIS — E7800 Pure hypercholesterolemia, unspecified: Secondary | ICD-10-CM | POA: Diagnosis not present

## 2017-06-15 DIAGNOSIS — F419 Anxiety disorder, unspecified: Secondary | ICD-10-CM | POA: Diagnosis not present

## 2017-06-15 DIAGNOSIS — K219 Gastro-esophageal reflux disease without esophagitis: Secondary | ICD-10-CM | POA: Diagnosis not present

## 2017-06-15 DIAGNOSIS — I10 Essential (primary) hypertension: Secondary | ICD-10-CM | POA: Diagnosis not present

## 2017-06-15 DIAGNOSIS — F329 Major depressive disorder, single episode, unspecified: Secondary | ICD-10-CM | POA: Diagnosis not present

## 2017-06-15 DIAGNOSIS — I712 Thoracic aortic aneurysm, without rupture: Secondary | ICD-10-CM | POA: Diagnosis not present

## 2017-07-22 DIAGNOSIS — I340 Nonrheumatic mitral (valve) insufficiency: Secondary | ICD-10-CM | POA: Diagnosis not present

## 2017-07-22 DIAGNOSIS — I480 Paroxysmal atrial fibrillation: Secondary | ICD-10-CM | POA: Diagnosis not present

## 2017-07-22 DIAGNOSIS — I351 Nonrheumatic aortic (valve) insufficiency: Secondary | ICD-10-CM | POA: Diagnosis not present

## 2017-07-22 DIAGNOSIS — I2510 Atherosclerotic heart disease of native coronary artery without angina pectoris: Secondary | ICD-10-CM | POA: Diagnosis not present

## 2017-07-22 DIAGNOSIS — I071 Rheumatic tricuspid insufficiency: Secondary | ICD-10-CM | POA: Diagnosis not present

## 2017-12-03 DIAGNOSIS — K219 Gastro-esophageal reflux disease without esophagitis: Secondary | ICD-10-CM | POA: Diagnosis not present

## 2017-12-03 DIAGNOSIS — F320 Major depressive disorder, single episode, mild: Secondary | ICD-10-CM | POA: Diagnosis not present

## 2017-12-03 DIAGNOSIS — M129 Arthropathy, unspecified: Secondary | ICD-10-CM | POA: Diagnosis not present

## 2017-12-03 DIAGNOSIS — F419 Anxiety disorder, unspecified: Secondary | ICD-10-CM | POA: Diagnosis not present

## 2017-12-03 DIAGNOSIS — I480 Paroxysmal atrial fibrillation: Secondary | ICD-10-CM | POA: Diagnosis not present

## 2017-12-03 DIAGNOSIS — E7800 Pure hypercholesterolemia, unspecified: Secondary | ICD-10-CM | POA: Diagnosis not present

## 2017-12-03 DIAGNOSIS — I10 Essential (primary) hypertension: Secondary | ICD-10-CM | POA: Diagnosis not present

## 2017-12-03 DIAGNOSIS — Z0000 Encounter for general adult medical examination without abnormal findings: Secondary | ICD-10-CM | POA: Diagnosis not present

## 2017-12-11 DIAGNOSIS — E7800 Pure hypercholesterolemia, unspecified: Secondary | ICD-10-CM | POA: Diagnosis not present

## 2017-12-11 DIAGNOSIS — I10 Essential (primary) hypertension: Secondary | ICD-10-CM | POA: Diagnosis not present

## 2017-12-31 ENCOUNTER — Inpatient Hospital Stay
Admission: EM | Admit: 2017-12-31 | Discharge: 2018-01-07 | DRG: 871 | Disposition: A | Discharge status: Skilled Nursing Facility | Payer: Medicare HMO | Attending: Internal Medicine | Admitting: Internal Medicine

## 2017-12-31 ENCOUNTER — Other Ambulatory Visit: Payer: Self-pay

## 2017-12-31 ENCOUNTER — Inpatient Hospital Stay: Payer: Medicare HMO

## 2017-12-31 ENCOUNTER — Emergency Department: Payer: Medicare HMO

## 2017-12-31 ENCOUNTER — Encounter: Payer: Self-pay | Admitting: Emergency Medicine

## 2017-12-31 DIAGNOSIS — I493 Ventricular premature depolarization: Secondary | ICD-10-CM | POA: Diagnosis present

## 2017-12-31 DIAGNOSIS — M6281 Muscle weakness (generalized): Secondary | ICD-10-CM | POA: Diagnosis not present

## 2017-12-31 DIAGNOSIS — I214 Non-ST elevation (NSTEMI) myocardial infarction: Secondary | ICD-10-CM | POA: Diagnosis present

## 2017-12-31 DIAGNOSIS — I110 Hypertensive heart disease with heart failure: Secondary | ICD-10-CM | POA: Diagnosis not present

## 2017-12-31 DIAGNOSIS — I5030 Unspecified diastolic (congestive) heart failure: Secondary | ICD-10-CM | POA: Diagnosis not present

## 2017-12-31 DIAGNOSIS — J189 Pneumonia, unspecified organism: Secondary | ICD-10-CM

## 2017-12-31 DIAGNOSIS — A419 Sepsis, unspecified organism: Secondary | ICD-10-CM | POA: Diagnosis not present

## 2017-12-31 DIAGNOSIS — I248 Other forms of acute ischemic heart disease: Secondary | ICD-10-CM | POA: Diagnosis not present

## 2017-12-31 DIAGNOSIS — I4891 Unspecified atrial fibrillation: Secondary | ICD-10-CM | POA: Diagnosis not present

## 2017-12-31 DIAGNOSIS — Z888 Allergy status to other drugs, medicaments and biological substances status: Secondary | ICD-10-CM

## 2017-12-31 DIAGNOSIS — N179 Acute kidney failure, unspecified: Secondary | ICD-10-CM | POA: Diagnosis not present

## 2017-12-31 DIAGNOSIS — Z87440 Personal history of urinary (tract) infections: Secondary | ICD-10-CM

## 2017-12-31 DIAGNOSIS — I959 Hypotension, unspecified: Secondary | ICD-10-CM | POA: Diagnosis not present

## 2017-12-31 DIAGNOSIS — K219 Gastro-esophageal reflux disease without esophagitis: Secondary | ICD-10-CM | POA: Diagnosis present

## 2017-12-31 DIAGNOSIS — R0602 Shortness of breath: Secondary | ICD-10-CM | POA: Diagnosis not present

## 2017-12-31 DIAGNOSIS — I10 Essential (primary) hypertension: Secondary | ICD-10-CM | POA: Diagnosis not present

## 2017-12-31 DIAGNOSIS — Z806 Family history of leukemia: Secondary | ICD-10-CM

## 2017-12-31 DIAGNOSIS — D649 Anemia, unspecified: Secondary | ICD-10-CM | POA: Diagnosis present

## 2017-12-31 DIAGNOSIS — Z9911 Dependence on respirator [ventilator] status: Secondary | ICD-10-CM | POA: Diagnosis not present

## 2017-12-31 DIAGNOSIS — D508 Other iron deficiency anemias: Secondary | ICD-10-CM | POA: Diagnosis not present

## 2017-12-31 DIAGNOSIS — Z8249 Family history of ischemic heart disease and other diseases of the circulatory system: Secondary | ICD-10-CM

## 2017-12-31 DIAGNOSIS — E876 Hypokalemia: Secondary | ICD-10-CM | POA: Diagnosis present

## 2017-12-31 DIAGNOSIS — F419 Anxiety disorder, unspecified: Secondary | ICD-10-CM | POA: Diagnosis present

## 2017-12-31 DIAGNOSIS — I480 Paroxysmal atrial fibrillation: Secondary | ICD-10-CM | POA: Diagnosis present

## 2017-12-31 DIAGNOSIS — Z515 Encounter for palliative care: Secondary | ICD-10-CM

## 2017-12-31 DIAGNOSIS — I491 Atrial premature depolarization: Secondary | ICD-10-CM | POA: Diagnosis present

## 2017-12-31 DIAGNOSIS — J849 Interstitial pulmonary disease, unspecified: Secondary | ICD-10-CM | POA: Diagnosis not present

## 2017-12-31 DIAGNOSIS — J90 Pleural effusion, not elsewhere classified: Secondary | ICD-10-CM | POA: Diagnosis not present

## 2017-12-31 DIAGNOSIS — Z79899 Other long term (current) drug therapy: Secondary | ICD-10-CM | POA: Diagnosis not present

## 2017-12-31 DIAGNOSIS — R531 Weakness: Secondary | ICD-10-CM | POA: Diagnosis not present

## 2017-12-31 DIAGNOSIS — I5032 Chronic diastolic (congestive) heart failure: Secondary | ICD-10-CM | POA: Diagnosis not present

## 2017-12-31 DIAGNOSIS — J129 Viral pneumonia, unspecified: Secondary | ICD-10-CM | POA: Diagnosis not present

## 2017-12-31 DIAGNOSIS — E860 Dehydration: Secondary | ICD-10-CM | POA: Diagnosis present

## 2017-12-31 DIAGNOSIS — Z90710 Acquired absence of both cervix and uterus: Secondary | ICD-10-CM

## 2017-12-31 DIAGNOSIS — Z885 Allergy status to narcotic agent status: Secondary | ICD-10-CM

## 2017-12-31 DIAGNOSIS — I447 Left bundle-branch block, unspecified: Secondary | ICD-10-CM | POA: Diagnosis present

## 2017-12-31 DIAGNOSIS — R059 Cough, unspecified: Secondary | ICD-10-CM

## 2017-12-31 DIAGNOSIS — J9601 Acute respiratory failure with hypoxia: Secondary | ICD-10-CM | POA: Diagnosis present

## 2017-12-31 DIAGNOSIS — R000 Tachycardia, unspecified: Secondary | ICD-10-CM | POA: Diagnosis not present

## 2017-12-31 DIAGNOSIS — E872 Acidosis: Secondary | ICD-10-CM | POA: Diagnosis not present

## 2017-12-31 DIAGNOSIS — R7989 Other specified abnormal findings of blood chemistry: Secondary | ICD-10-CM | POA: Diagnosis not present

## 2017-12-31 DIAGNOSIS — I950 Idiopathic hypotension: Secondary | ICD-10-CM | POA: Diagnosis not present

## 2017-12-31 DIAGNOSIS — Z7189 Other specified counseling: Secondary | ICD-10-CM

## 2017-12-31 DIAGNOSIS — F064 Anxiety disorder due to known physiological condition: Secondary | ICD-10-CM | POA: Diagnosis not present

## 2017-12-31 DIAGNOSIS — J181 Lobar pneumonia, unspecified organism: Secondary | ICD-10-CM | POA: Diagnosis present

## 2017-12-31 DIAGNOSIS — R05 Cough: Secondary | ICD-10-CM

## 2017-12-31 HISTORY — DX: Unspecified diastolic (congestive) heart failure: I50.30

## 2017-12-31 LAB — LACTIC ACID, PLASMA
LACTIC ACID, VENOUS: 2.6 mmol/L — AB (ref 0.5–1.9)
Lactic Acid, Venous: 3.3 mmol/L (ref 0.5–1.9)
Lactic Acid, Venous: 1.2 mmol/L (ref 0.5–1.9)

## 2017-12-31 LAB — URINALYSIS, ROUTINE W REFLEX MICROSCOPIC
Bilirubin Urine: NEGATIVE
Glucose, UA: NEGATIVE mg/dL
Hgb urine dipstick: NEGATIVE
Ketones, ur: NEGATIVE mg/dL
Leukocytes, UA: NEGATIVE
Nitrite: NEGATIVE
PROTEIN: NEGATIVE mg/dL
SPECIFIC GRAVITY, URINE: 1.013 (ref 1.005–1.030)
pH: 5 (ref 5.0–8.0)

## 2017-12-31 LAB — CBC WITH DIFFERENTIAL/PLATELET
Basophils Absolute: 0 10*3/uL (ref 0–0.1)
Basophils Relative: 0 %
Eosinophils Absolute: 0 10*3/uL (ref 0–0.7)
Eosinophils Relative: 0 %
HCT: 26.9 % — ABNORMAL LOW (ref 35.0–47.0)
HEMOGLOBIN: 9.2 g/dL — AB (ref 12.0–16.0)
LYMPHS ABS: 0.7 10*3/uL — AB (ref 1.0–3.6)
LYMPHS PCT: 5 %
MCH: 32.2 pg (ref 26.0–34.0)
MCHC: 34.2 g/dL (ref 32.0–36.0)
MCV: 94 fL (ref 80.0–100.0)
Monocytes Absolute: 0.9 10*3/uL (ref 0.2–0.9)
Monocytes Relative: 7 %
Neutro Abs: 12.1 10*3/uL — ABNORMAL HIGH (ref 1.4–6.5)
Neutrophils Relative %: 88 %
Platelets: 174 10*3/uL (ref 150–440)
RBC: 2.86 MIL/uL — AB (ref 3.80–5.20)
RDW: 13.8 % (ref 11.5–14.5)
WBC: 13.7 10*3/uL — AB (ref 3.6–11.0)

## 2017-12-31 LAB — COMPREHENSIVE METABOLIC PANEL
ALT: 9 U/L (ref 0–44)
AST: 25 U/L (ref 15–41)
Albumin: 2.5 g/dL — ABNORMAL LOW (ref 3.5–5.0)
Alkaline Phosphatase: 43 U/L (ref 38–126)
Anion gap: 11 (ref 5–15)
BILIRUBIN TOTAL: 0.8 mg/dL (ref 0.3–1.2)
BUN: 24 mg/dL — ABNORMAL HIGH (ref 8–23)
CO2: 21 mmol/L — AB (ref 22–32)
Calcium: 7.7 mg/dL — ABNORMAL LOW (ref 8.9–10.3)
Chloride: 106 mmol/L (ref 98–111)
Creatinine, Ser: 1.44 mg/dL — ABNORMAL HIGH (ref 0.44–1.00)
GFR calc non Af Amer: 31 mL/min — ABNORMAL LOW (ref >60)
GFR, EST AFRICAN AMERICAN: 35 mL/min — AB (ref >60)
GLUCOSE: 92 mg/dL (ref 70–99)
POTASSIUM: 3 mmol/L — AB (ref 3.5–5.1)
Sodium: 138 mmol/L (ref 135–145)
TOTAL PROTEIN: 6.3 g/dL — AB (ref 6.5–8.1)

## 2017-12-31 LAB — TROPONIN I
Troponin I: 0.95 ng/mL (ref <0.03)
Troponin I: 1.61 ng/mL (ref <0.03)
Troponin I: 1.79 ng/mL (ref <0.03)

## 2017-12-31 LAB — PROTIME-INR
INR: 1.29
PROTHROMBIN TIME: 16 s — AB (ref 11.4–15.2)

## 2017-12-31 LAB — APTT: APTT: 35 s (ref 24–36)

## 2017-12-31 LAB — PROCALCITONIN: Procalcitonin: 5.5 ng/mL

## 2017-12-31 LAB — HEPARIN LEVEL (UNFRACTIONATED): HEPARIN UNFRACTIONATED: 0.11 [IU]/mL — AB (ref 0.30–0.70)

## 2017-12-31 MED ORDER — SODIUM CHLORIDE 0.9 % IV BOLUS
1000.0000 mL | Freq: Once | INTRAVENOUS | Status: AC
  Administered 2017-12-31: 1000 mL via INTRAVENOUS

## 2017-12-31 MED ORDER — ASPIRIN 81 MG PO CHEW
CHEWABLE_TABLET | ORAL | Status: AC
  Filled 2017-12-31: qty 4

## 2017-12-31 MED ORDER — NITROGLYCERIN 0.4 MG SL SUBL
SUBLINGUAL_TABLET | SUBLINGUAL | Status: AC
  Administered 2017-12-31: 0.4 mg via SUBLINGUAL
  Filled 2017-12-31: qty 1

## 2017-12-31 MED ORDER — SODIUM CHLORIDE 0.9 % IV SOLN: 1.0000 g | INTRAVENOUS | Status: DC

## 2017-12-31 MED ORDER — ONDANSETRON HCL 4 MG PO TABS: 4.0000 mg | ORAL_TABLET | Freq: Four times a day (QID) | ORAL | Status: DC | PRN

## 2017-12-31 MED ORDER — ACETAMINOPHEN 325 MG PO TABS: 650.0000 mg | ORAL_TABLET | Freq: Four times a day (QID) | ORAL | Status: DC | PRN

## 2017-12-31 MED ORDER — ALBUTEROL SULFATE (2.5 MG/3ML) 0.083% IN NEBU: 2.5000 mg | INHALATION_SOLUTION | RESPIRATORY_TRACT | Status: DC | PRN

## 2017-12-31 MED ORDER — SODIUM CHLORIDE 0.9% FLUSH: 3.0000 mL | INTRAVENOUS | Status: DC | PRN

## 2017-12-31 MED ORDER — POLYETHYLENE GLYCOL 3350 17 G PO PACK: 17.0000 g | PACK | Freq: Every day | ORAL | Status: DC | PRN

## 2017-12-31 MED ORDER — POTASSIUM CHLORIDE 10 MEQ/100ML IV SOLN
10.0000 meq | INTRAVENOUS | Status: AC
  Administered 2017-12-31 (×3): 10 meq via INTRAVENOUS
  Filled 2017-12-31 (×4): qty 100

## 2017-12-31 MED ORDER — VANCOMYCIN HCL 500 MG IV SOLR
500.0000 mg | Freq: Once | INTRAVENOUS | Status: AC
  Administered 2018-01-01: 500 mg via INTRAVENOUS
  Filled 2017-12-31: qty 500

## 2017-12-31 MED ORDER — SODIUM CHLORIDE 0.9% FLUSH
3.0000 mL | Freq: Two times a day (BID) | INTRAVENOUS | Status: DC
  Administered 2017-12-31 – 2018-01-02 (×3): 3 mL via INTRAVENOUS

## 2017-12-31 MED ORDER — FENTANYL CITRATE (PF) 100 MCG/2ML IJ SOLN
INTRAMUSCULAR | Status: AC
  Administered 2017-12-31: 50 ug via INTRAVENOUS
  Filled 2017-12-31: qty 2

## 2017-12-31 MED ORDER — SODIUM CHLORIDE 0.9 % IV SOLN
2.0000 g | INTRAVENOUS | Status: DC
  Administered 2017-12-31: 2 g via INTRAVENOUS
  Filled 2017-12-31: qty 2

## 2017-12-31 MED ORDER — FENTANYL CITRATE (PF) 100 MCG/2ML IJ SOLN
50.0000 ug | Freq: Once | INTRAMUSCULAR | Status: AC
  Administered 2017-12-31: 50 ug via INTRAVENOUS

## 2017-12-31 MED ORDER — SODIUM CHLORIDE 0.9 % IV BOLUS
250.0000 mL | Freq: Once | INTRAVENOUS | Status: AC
  Administered 2017-12-31: 250 mL via INTRAVENOUS

## 2017-12-31 MED ORDER — SODIUM CHLORIDE 0.9% FLUSH
3.0000 mL | Freq: Two times a day (BID) | INTRAVENOUS | Status: DC
  Administered 2018-01-01 – 2018-01-02 (×2): 3 mL via INTRAVENOUS

## 2017-12-31 MED ORDER — PANTOPRAZOLE SODIUM 40 MG PO TBEC
40.0000 mg | DELAYED_RELEASE_TABLET | Freq: Every day | ORAL | Status: DC
  Administered 2018-01-01 – 2018-01-07 (×7): 40 mg via ORAL
  Filled 2017-12-31 (×8): qty 1

## 2017-12-31 MED ORDER — VANCOMYCIN HCL 500 MG IV SOLR
500.0000 mg | INTRAVENOUS | Status: DC
  Filled 2017-12-31: qty 500

## 2017-12-31 MED ORDER — ONDANSETRON HCL 4 MG/2ML IJ SOLN
4.0000 mg | Freq: Four times a day (QID) | INTRAMUSCULAR | Status: DC | PRN
  Administered 2017-12-31 – 2018-01-04 (×2): 4 mg via INTRAVENOUS
  Filled 2017-12-31 (×2): qty 2

## 2017-12-31 MED ORDER — ACETAMINOPHEN 650 MG RE SUPP
650.0000 mg | Freq: Four times a day (QID) | RECTAL | Status: DC | PRN
  Administered 2017-12-31: 650 mg via RECTAL
  Filled 2017-12-31 (×2): qty 1

## 2017-12-31 MED ORDER — SODIUM CHLORIDE 0.9 % IV SOLN
INTRAVENOUS | Status: DC | PRN
  Administered 2017-12-31 – 2018-01-02 (×3): 500 mL via INTRAVENOUS

## 2017-12-31 MED ORDER — HEPARIN BOLUS VIA INFUSION
2800.0000 [IU] | Freq: Once | INTRAVENOUS | Status: AC
  Administered 2017-12-31: 2800 [IU] via INTRAVENOUS
  Filled 2017-12-31: qty 2800

## 2017-12-31 MED ORDER — SODIUM CHLORIDE 0.9 % IV BOLUS
500.0000 mL | Freq: Once | INTRAVENOUS | Status: AC
  Administered 2017-12-31: 500 mL via INTRAVENOUS

## 2017-12-31 MED ORDER — ASPIRIN 81 MG PO CHEW
324.0000 mg | CHEWABLE_TABLET | Freq: Once | ORAL | Status: AC
  Administered 2017-12-31: 324 mg via ORAL

## 2017-12-31 MED ORDER — HEPARIN (PORCINE) IN NACL 100-0.45 UNIT/ML-% IJ SOLN
800.0000 [IU]/h | INTRAMUSCULAR | Status: DC
  Administered 2017-12-31: 550 [IU]/h via INTRAVENOUS
  Filled 2017-12-31: qty 250

## 2017-12-31 MED ORDER — NITROGLYCERIN 0.4 MG SL SUBL
0.4000 mg | SUBLINGUAL_TABLET | SUBLINGUAL | Status: DC | PRN
  Administered 2017-12-31 (×2): 0.4 mg via SUBLINGUAL

## 2017-12-31 MED ORDER — SODIUM CHLORIDE 0.9 % IV SOLN
INTRAVENOUS | Status: AC
  Administered 2017-12-31: 1 g via INTRAVENOUS
  Filled 2017-12-31: qty 10

## 2017-12-31 MED ORDER — POTASSIUM CHLORIDE CRYS ER 20 MEQ PO TBCR
40.0000 meq | EXTENDED_RELEASE_TABLET | ORAL | Status: DC
  Filled 2017-12-31: qty 2

## 2017-12-31 MED ORDER — AZITHROMYCIN 250 MG PO TABS
500.0000 mg | ORAL_TABLET | Freq: Every day | ORAL | Status: DC
  Administered 2018-01-01: 500 mg via ORAL
  Filled 2017-12-31 (×2): qty 2

## 2017-12-31 MED ORDER — TIZANIDINE HCL 2 MG PO TABS
1.0000 mg | ORAL_TABLET | ORAL | Status: DC | PRN
  Filled 2017-12-31: qty 0.5

## 2017-12-31 MED ORDER — ALPRAZOLAM 0.5 MG PO TABS
0.2500 mg | ORAL_TABLET | Freq: Two times a day (BID) | ORAL | Status: DC | PRN
  Administered 2017-12-31: 0.25 mg via ORAL
  Administered 2018-01-02: 0.5 mg via ORAL
  Administered 2018-01-04 – 2018-01-06 (×3): 0.25 mg via ORAL
  Filled 2017-12-31 (×5): qty 1

## 2017-12-31 MED ORDER — HEPARIN BOLUS VIA INFUSION
1400.0000 [IU] | Freq: Once | INTRAVENOUS | Status: AC
  Administered 2017-12-31: 1400 [IU] via INTRAVENOUS
  Filled 2017-12-31: qty 1400

## 2017-12-31 MED ORDER — VANCOMYCIN HCL 500 MG IV SOLR
500.0000 mg | INTRAVENOUS | Status: DC
  Administered 2017-12-31: 500 mg via INTRAVENOUS
  Filled 2017-12-31: qty 500

## 2017-12-31 MED ORDER — SERTRALINE HCL 50 MG PO TABS
50.0000 mg | ORAL_TABLET | Freq: Every day | ORAL | Status: DC
  Administered 2018-01-01 – 2018-01-07 (×7): 50 mg via ORAL
  Filled 2017-12-31 (×8): qty 1

## 2017-12-31 MED ORDER — ACETAMINOPHEN 650 MG RE SUPP
325.0000 mg | Freq: Once | RECTAL | Status: AC
  Administered 2018-01-01: 325 mg via RECTAL

## 2017-12-31 MED ORDER — TRAMADOL HCL 50 MG PO TABS
50.0000 mg | ORAL_TABLET | Freq: Four times a day (QID) | ORAL | Status: DC | PRN
  Administered 2017-12-31 – 2018-01-07 (×10): 50 mg via ORAL
  Filled 2017-12-31 (×10): qty 1

## 2017-12-31 NOTE — Progress Notes (Addendum)
Pharmacy Antibiotic Note  Colleen Nguyen is a 84 y.o. female admitted on 12/31/2017 with sepsis.  Pharmacy has been consulted for Vancomycin, Cefepime  dosing.  Plan: Vancomycin 500 mg IV X 1 ordered to be given on 10/3 @ 22:00. Vancomycin 500 mg IV Q36H ordered to start on 10/4 @ 0400, ~ 6 hrs after 1st dose (stacked dosing). This pt will reach Css by 10/11. Will draw 1st trough on 10/8 @ 15:30, which will not be at Css.   Cefepime 2 gm IV Q24H ordered to start on 10/3 @ 22:00.   CrCl = 18.2 ml/min Ke = 0.02 hr-1 T1/2 = 36.5 hrs Vd = 32.4 L   Height: 5\' 1"  (154.9 cm) Weight: 102 lb (46.3 kg) IBW/kg (Calculated) : 47.8  Temp (24hrs), Avg:100.3 F (37.9 C), Min:98.4 F (36.9 C), Max:103.5 F (39.7 C)  Recent Labs  Lab 12/31/17 1112 12/31/17 1522  WBC 13.7*  --   CREATININE 1.44*  --   LATICACIDVEN 2.6* 3.3*    Estimated Creatinine Clearance: 18.2 mL/min (A) (by C-G formula based on SCr of 1.44 mg/dL (H)).    Allergies  Allergen Reactions  . Carisoprodol Other (See Comments)    Reaction:  Hypersomnolence   . Phenergan [Promethazine] Other (See Comments)    Reaction:  Hallucinations   . Norco [Hydrocodone-Acetaminophen] Itching and Rash  . Pravastatin Rash    Antimicrobials this admission:   >>    >>  Cefepime changed to meropenem 10/4 per consult.  Dose adjustments this admission:   Microbiology results:  BCx:   UCx:   Sputum:    MRSA PCR:   Thank you for allowing pharmacy to be a part of this patient's care.  Robbins,Jason D 12/31/2017 9:55 PM

## 2017-12-31 NOTE — Progress Notes (Signed)
ANTICOAGULATION CONSULT NOTE - Initial Consult  Pharmacy Consult for heparin Indication: chest pain/ACS  Allergies  Allergen Reactions  . Carisoprodol Other (See Comments)    Reaction:  Hypersomnolence   . Phenergan [Promethazine] Other (See Comments)    Reaction:  Hallucinations   . Norco [Hydrocodone-Acetaminophen] Itching and Rash  . Pravastatin Rash    Patient Measurements: Height: 5\' 1"  (154.9 cm) Weight: 102 lb (46.3 kg) IBW/kg (Calculated) : 47.8 Heparin Dosing Weight: 46.3 kg  Vital Signs: Temp: 98.4 F (36.9 C) (10/03 1105) Temp Source: Oral (10/03 1105) BP: 118/76 (10/03 1200) Pulse Rate: 103 (10/03 1107)  Labs: Recent Labs    12/31/17 1112  HGB 9.2*  HCT 26.9*  PLT 174  CREATININE 1.44*  TROPONINI 0.95*    Estimated Creatinine Clearance: 18.2 mL/min (A) (by C-G formula based on SCr of 1.44 mg/dL (H)).   Medical History: Past Medical History:  Diagnosis Date  . Anemia   . Anxiety   . Atrial fibrillation ([place])   . Hypertension     Medications:  Infusions:  . heparin      Assessment: 89 yof cc generalized weakness, hypotension, hypoxia. PMH significant for HTN, anemia, anxiety, AF. Initial troponin in ED noted to be 0.95. EKG with AF and LBBB, ventricular paced complexes. No PTA OAC noted but list is yet to be completed. Will follow up. Pharmacy consulted to manage heparin for ACS.  Goal of Therapy:  Heparin level 0.3-0.7 units/ml Monitor platelets by anticoagulation protocol: Yes   Plan:  Give 2800 units bolus x 1 Start heparin infusion at 550 units/hr Check anti-Xa level in 8 hours and daily while on heparin Continue to monitor H&H and platelets  Laural Benes, Pharm.D., BCPS Clinical Pharmacist 12/31/2017,12:23 PM

## 2017-12-31 NOTE — ED Triage Notes (Signed)
Pt arrived via ems from home where she lives with family. Pt's family called ems with complaints of generalized weakness. EMS reports hypotensive readings in route; initial 60/34 pt was then given 800 cc of NS and second blood pressure 75/47. Pt alert upon arrival and reports "bone pain." pt took 1 Tizanidine prior to arrival

## 2017-12-31 NOTE — Progress Notes (Signed)
ANTICOAGULATION CONSULT NOTE - Initial Consult  Pharmacy Consult for heparin Indication: chest pain/ACS  Allergies  Allergen Reactions  . Carisoprodol Other (See Comments)    Reaction:  Hypersomnolence   . Phenergan [Promethazine] Other (See Comments)    Reaction:  Hallucinations   . Norco [Hydrocodone-Acetaminophen] Itching and Rash  . Pravastatin Rash    Patient Measurements: Height: 5\' 1"  (154.9 cm) Weight: 102 lb (46.3 kg) IBW/kg (Calculated) : 47.8 Heparin Dosing Weight: 46.3 kg  Vital Signs: Temp: 103.5 F (39.7 C) (10/03 2100) Temp Source: Rectal (10/03 2100) BP: 118/54 (10/03 1953) Pulse Rate: 96 (10/03 1953)  Labs: Recent Labs    12/31/17 1112 12/31/17 1522 12/31/17 2156  HGB 9.2*  --   --   HCT 26.9*  --   --   PLT 174  --   --   APTT 35  --   --   LABPROT 16.0*  --   --   INR 1.29  --   --   HEPARINUNFRC  --   --  0.11*  CREATININE 1.44*  --   --   TROPONINI 0.95* 1.61*  --     Estimated Creatinine Clearance: 18.2 mL/min (A) (by C-G formula based on SCr of 1.44 mg/dL (H)).   Medical History: Past Medical History:  Diagnosis Date  . Anemia   . Anxiety   . Atrial fibrillation ([place])   . Hypertension     Medications:  Infusions:  . sodium chloride    . ceFEPime (MAXIPIME) IV    . cefTRIAXone (ROCEPHIN)  IV Stopped (12/31/17 1403)  . heparin 550 Units/hr (12/31/17 1247)  . vancomycin    . [START ON 01/01/2018] vancomycin      Assessment: 89 yof cc generalized weakness, hypotension, hypoxia. PMH significant for HTN, anemia, anxiety, AF. Initial troponin in ED noted to be 0.95. EKG with AF and LBBB, ventricular paced complexes. No PTA OAC noted but list is yet to be completed. Will follow up. Pharmacy consulted to manage heparin for ACS.  Goal of Therapy:  Heparin level 0.3-0.7 units/ml Monitor platelets by anticoagulation protocol: Yes   Plan:  Give 2800 units bolus x 1 Start heparin infusion at 550 units/hr Check anti-Xa level in 8  hours and daily while on heparin Continue to monitor H&H and platelets   10/3:  HL @ 21:56 = 0.11 Will order Heparin 1400 units IV X 1 bolus and increase drip rate to 700 units/hr.  Will recheck HL 8 hrs after rate change.   Anatalia Kronk D, Pharm.D Clinical Pharmacist 12/31/2017,10:35 PM

## 2017-12-31 NOTE — ED Notes (Signed)
Bladder scan showed prox 148mL of urine in bladder

## 2017-12-31 NOTE — ED Notes (Signed)
Family at bedside. 

## 2017-12-31 NOTE — ED Notes (Signed)
Pt placed on 2l [place] after o2 reading 88%

## 2017-12-31 NOTE — ED Provider Notes (Signed)
[facility] Emergency Department Provider Note  Time seen: 11:10 AM  I have reviewed the triage vital signs and the nursing notes.   HISTORY  Chief Complaint Hypotension and Weakness    HPI Colleen Nguyen is a 84 y.o. female with a past medical history of anxiety, atrial fibrillation, hypertension, presents to the emergency department for weakness and hypotension.  According to the patient for the past 2 days she has been feeling very weak, EMS reports the family called today due to weakness, they found the patient be hypotensive 60s over 66s.  They gave the patient approximate 800 cc of IV fluids in route to the hospital upon arrival 74/50.  Patient has no pain complaints, does state mild dysuria on review of systems as well as a mild cough.  Afebrile.   Past Medical History:  Diagnosis Date  . Anemia   . Anxiety   . Atrial fibrillation ([place])   . Hypertension     Patient Active Problem List   Diagnosis Date Noted  . UTI (urinary tract infection) 02/05/2017  . Atrial fibrillation with RVR ([place]) 09/30/2015  . Bradycardia 07/24/2015  . Malnutrition of moderate degree 07/23/2015  . Iron deficiency anemia, unspecified   . Iron deficiency anemia secondary to blood loss (chronic)   . Neoplasm of digestive system   . Acute blood loss anemia 07/22/2015  . GI bleed 07/22/2015  . Weakness generalized 07/22/2015  . Chronic atrial fibrillation 07/22/2015    Past Surgical History:  Procedure Laterality Date  . ABDOMINAL HYSTERECTOMY    . APPENDECTOMY    . COLONOSCOPY WITH PROPOFOL N/A 07/23/2015   Procedure: COLONOSCOPY WITH PROPOFOL;  Surgeon: Lucilla Lame, MD;  Location: [facility] ENDOSCOPY;  Service: Endoscopy;  Laterality: N/A;  . ESOPHAGOGASTRODUODENOSCOPY (EGD) WITH PROPOFOL N/A 07/23/2015   Procedure: ESOPHAGOGASTRODUODENOSCOPY (EGD) WITH PROPOFOL;  Surgeon: Lucilla Lame, MD;  Location: [facility] ENDOSCOPY;  Service: Endoscopy;  Laterality: N/A;    Prior to  Admission medications   Medication Sig Start Date End Date Taking? Authorizing Provider  ALPRAZolam Duanne Moron) 0.5 MG tablet Take 0.25-0.5 mg by mouth 2 (two) times daily as needed for anxiety or sleep.     [provider]  cephALEXin (KEFLEX) 500 MG capsule Take 1 capsule (500 mg total) 2 (two) times daily by mouth. 02/06/17   Demetrios Loll, MD  HYDROcodone-acetaminophen (NORCO/VICODIN) 5-325 MG tablet Take 1 tablet by mouth every 8 (eight) hours as needed for moderate pain.    [provider]  lisinopril-hydrochlorothiazide (PRINZIDE,ZESTORETIC) 20-12.5 MG tablet Take 1 tablet by mouth daily.    [provider]  metoprolol (LOPRESSOR) 50 MG tablet Take 25 mg by mouth 2 (two) times daily.  09/27/15   [provider]  omeprazole (PRILOSEC) 20 MG capsule Take 20 mg by mouth daily.    [provider]    Allergies  Allergen Reactions  . Carisoprodol Other (See Comments)    Reaction:  Hypersomnolence   . Phenergan [Promethazine] Other (See Comments)    Reaction:  Hallucinations   . Norco [Hydrocodone-Acetaminophen] Itching and Rash  . Pravastatin Rash    Family History  Problem Relation Age of Onset  . Leukemia Mother   . Stroke Father   . CAD Brother     Social History Social History   Tobacco Use  . Smoking status: Never Smoker  . Smokeless tobacco: Never Used  Substance Use Topics  . Alcohol use: No  . Drug use: No    Review of Systems Constitutional:  Negative for fever.  Positive for generalized weakness ENT: Negative for recent illness/congestion Cardiovascular: Negative for chest pain. Respiratory: Negative for shortness of breath.  Occasional cough Gastrointestinal: Negative for abdominal pain, vomiting and diarrhea. Genitourinary: Mild dysuria Musculoskeletal: Negative for musculoskeletal complaints Skin: Negative for skin complaints  Neurological: Negative for headache All other ROS  negative  ____________________________________________   PHYSICAL EXAM:  VITAL SIGNS: ED Triage Vitals  Enc Vitals Group     BP 12/31/17 1105 (!) 92/57     Pulse Rate 12/31/17 1107 (!) 103     Resp 12/31/17 1107 11     Temp 12/31/17 1105 98.4 F (36.9 C)     Temp Source 12/31/17 1105 Oral     SpO2 12/31/17 1107 98 %     Weight 12/31/17 1106 102 lb (46.3 kg)     Height 12/31/17 1106 5\' 1"  (1.549 m)     Head Circumference --      Peak Flow --      Pain Score 12/31/17 1106 8     Pain Loc --      Pain Edu? --      Excl. in [place]? --    Constitutional: Alert and oriented. Well appearing and in no distress. Eyes: Normal exam ENT   Head: Normocephalic and atraumatic.   Mouth/Throat: Mucous membranes are moist. Cardiovascular: Normal rate, regular rhythm. No murmur Respiratory: Normal respiratory effort without tachypnea nor retractions. Breath sounds are clear  Gastrointestinal: Soft and nontender.  Mild fullness to the suprapubic area.  Nontender. Musculoskeletal: Nontender with normal range of motion in all extremities.  Neurologic:  Normal speech and language. No gross focal neurologic deficits  Skin:  Skin is warm, dry and intact.  Psychiatric: Mood and affect are normal.   ____________________________________________    EKG  EKG reviewed and interpreted by myself shows atrial for ablation 115 bpm with a widened QRS, normal axis, largely normal intervals nonspecific ST changes most consistent with left bundle branch block.  Old EKG from 2017 shows no left bundle branch block.  EKG #2 reviewed and interpreted by myself 11: 53: 24 shows atrial fibrillation with a widened QRS with a normal axis, again largely normal intervals continues to have nonspecific ST changes.  Again old EKG shows no left bundle branch block.  ____________________________________________    RADIOLOGY  Lingular edema/consolidation  ____________________________________________   INITIAL  IMPRESSION / ASSESSMENT AND PLAN / ED COURSE  Pertinent labs & imaging results that were available during my care of the patient were reviewed by me and considered in my medical decision making (see chart for details).  Patient presents to the emergency department for weakness found to be hypotensive by EMS.  Differential would include infectious etiology, dehydration/hypovolemia, renal insufficiency, ACS.  We will check labs, EKG, chest x-ray and perform a bladder scan.  Patient agreeable to plan of care.  Patient is now complaining of left-sided chest pain, repeat EKG continues to show a left bundle branch block, no history of left bundle branch block.  Labs have resulted showing a troponin 0.95.  Given the now the patient is complaining of left-sided chest pain with new on set left bundle branch block with an elevated troponin I discussed with cardiology Dr. Clayborn Bigness.  We will start the patient on heparin, patient has been given aspirin.  We will dose 50 mcg of fentanyl for pain as the patient's blood pressure remains on the lower side.  We will hold off on catheterization given the patient's age  and comorbidities until the clinical picture becomes more clear.  I discussed this with the patient and family they are agreeable to this plan of care.  Chest x-ray has returned showing possibility of lingular pneumonia.  I reviewed the images which could be consistent with pneumonia versus asymmetric pulmonary edema.  Given the likely end STEMI I suspect asymmetric pulmonary edema.  Hospitalist is covering with antibiotics which is appropriate.    CRITICAL CARE Performed by: Harvest Dark   Total critical care time: 30 minutes  Critical care time was exclusive of separately billable procedures and treating other patients.  Critical care was necessary to treat or prevent imminent or life-threatening deterioration.  Critical care was time spent personally by me on the following activities:  development of treatment plan with patient and/or surrogate as well as nursing, discussions with consultants, evaluation of patient's response to treatment, examination of patient, obtaining history from patient or surrogate, ordering and performing treatments and interventions, ordering and review of laboratory studies, ordering and review of radiographic studies, pulse oximetry and re-evaluation of patient's condition.   ____________________________________________   FINAL CLINICAL IMPRESSION(S) / ED DIAGNOSES  Weakness Hypotension NSTEMI   Harvest Dark, MD 12/31/17 1341

## 2017-12-31 NOTE — H&P (Signed)
[facility] NAME: Colleen Nguyen    MR#:  [id number]  DATE OF BIRTH:  Apr 13, 1922  DATE OF ADMISSION:  12/31/2017  PRIMARY CARE PHYSICIAN: Sofie Hartigan, MD   REQUESTING/REFERRING PHYSICIAN: Dr. Kerman Passey  CHIEF COMPLAINT:   Chief Complaint  Patient presents with  . Hypotension  . Weakness    HISTORY OF PRESENT ILLNESS:  Colleen Nguyen  is a 84 y.o. female with a known history of atrial fibrillation, hypertension, anxiety presents to the hospital with acute onset of shortness of breath and generalized weakness.  After arriving in the emergency room patient also had midsternal and epigastric pain which was dull.  Patient had an EKG done which shows left bundle branch block.  Troponin has returned at 0.95.  Case was discussed with Dr. Vista Deck of cardiology from emergency room and advised admission and starting heparin drip for non-ST elevation MI. Patient's blood pressure was in systolic of 62I when EMS arrived at the house.  After normal saline bolus blood pressure has improved and presently at 117/49.  PAST MEDICAL HISTORY:   Past Medical History:  Diagnosis Date  . Anemia   . Anxiety   . Atrial fibrillation ([place])   . Hypertension     PAST SURGICAL HISTORY:   Past Surgical History:  Procedure Laterality Date  . ABDOMINAL HYSTERECTOMY    . APPENDECTOMY    . COLONOSCOPY WITH PROPOFOL N/A 07/23/2015   Procedure: COLONOSCOPY WITH PROPOFOL;  Surgeon: Lucilla Lame, MD;  Location: [facility] ENDOSCOPY;  Service: Endoscopy;  Laterality: N/A;  . ESOPHAGOGASTRODUODENOSCOPY (EGD) WITH PROPOFOL N/A 07/23/2015   Procedure: ESOPHAGOGASTRODUODENOSCOPY (EGD) WITH PROPOFOL;  Surgeon: Lucilla Lame, MD;  Location: [facility] ENDOSCOPY;  Service: Endoscopy;  Laterality: N/A;    SOCIAL HISTORY:   Social History   Tobacco Use  . Smoking status: Never Smoker  . Smokeless tobacco: Never Used  Substance Use Topics  . Alcohol use: No    FAMILY  HISTORY:   Family History  Problem Relation Age of Onset  . Leukemia Mother   . Stroke Father   . CAD Brother     DRUG ALLERGIES:   Allergies  Allergen Reactions  . Carisoprodol Other (See Comments)    Reaction:  Hypersomnolence   . Phenergan [Promethazine] Other (See Comments)    Reaction:  Hallucinations   . Norco [Hydrocodone-Acetaminophen] Itching and Rash  . Pravastatin Rash    REVIEW OF SYSTEMS:   Review of Systems  Constitutional: Positive for malaise/fatigue. Negative for chills, fever and weight loss.  HENT: Negative for hearing loss and nosebleeds.   Eyes: Negative for blurred vision, double vision and pain.  Respiratory: Positive for shortness of breath. Negative for cough, hemoptysis, sputum production and wheezing.   Cardiovascular: Positive for chest pain. Negative for palpitations, orthopnea and leg swelling.  Gastrointestinal: Negative for abdominal pain, constipation, diarrhea, nausea and vomiting.  Genitourinary: Negative for dysuria and hematuria.  Musculoskeletal: Negative for back pain, falls and myalgias.  Skin: Negative for rash.  Neurological: Negative for dizziness, tremors, sensory change, speech change, focal weakness, seizures and headaches.  Endo/Heme/Allergies: Does not bruise/bleed easily.  Psychiatric/Behavioral: Negative for depression and memory loss. The patient is not nervous/anxious.     MEDICATIONS AT HOME:   Prior to Admission medications   Medication Sig Start Date End Date Taking? Authorizing Provider  ALPRAZolam Duanne Moron) 0.5 MG tablet Take 0.25-0.5 mg by mouth 2 (two) times daily as needed for anxiety or sleep.  Yes [provider]  ferrous sulfate 325 (65 FE) MG tablet Take 325 mg by mouth daily.   Yes [provider]  ibuprofen (ADVIL,MOTRIN) 200 MG tablet Take 800 mg by mouth at bedtime.   Yes [provider]  lisinopril-hydrochlorothiazide (PRINZIDE,ZESTORETIC) 20-12.5 MG tablet Take 1 tablet by  mouth daily.   Yes [provider]  metoprolol tartrate (LOPRESSOR) 25 MG tablet Take 25 mg by mouth daily.   Yes [provider]  omeprazole (PRILOSEC) 20 MG capsule Take 20 mg by mouth daily.   Yes [provider]  sertraline (ZOLOFT) 50 MG tablet Take 50 mg by mouth daily.   Yes [provider]  tiZANidine (ZANAFLEX) 2 MG tablet Take 1 mg by mouth as needed for muscle spasms.   Yes [provider]     VITAL SIGNS:  Blood pressure 117/62, pulse (!) 103, temperature 98.4 F (36.9 C), temperature source Oral, resp. rate (!) 24, height 5\' 1"  (1.549 m), weight 46.3 kg, SpO2 97 %.  PHYSICAL EXAMINATION:  Physical Exam  GENERAL:  84 y.o.-year-old patient lying in the bed .  Anxious EYES: Pupils equal, round, reactive to light and accommodation. No scleral icterus. Extraocular muscles intact.  HEENT: Head atraumatic, normocephalic. Oropharynx and nasopharynx clear. No oropharyngeal erythema, moist oral mucosa  NECK:  Supple, no jugular venous distention. No thyroid enlargement, no tenderness.  LUNGS: Normal breath sounds bilaterally, no wheezing, rales, rhonchi. No use of accessory muscles of respiration.  CARDIOVASCULAR: Irregularly irregular ABDOMEN: Soft, nontender, nondistended. Bowel sounds present. No organomegaly or mass.  EXTREMITIES: No pedal edema, cyanosis, or clubbing. + 2 pedal & radial pulses b/l.   NEUROLOGIC: Cranial nerves II through XII are intact. No focal Motor or sensory deficits appreciated b/l PSYCHIATRIC: The patient is alert and oriented x 3. Good affect.  SKIN: No obvious rash, lesion, or ulcer.   LABORATORY PANEL:   CBC Recent Labs  Lab 12/31/17 1112  WBC 13.7*  HGB 9.2*  HCT 26.9*  PLT 174   ------------------------------------------------------------------------------------------------------------------  Chemistries  Recent Labs  Lab 12/31/17 1112  NA 138  K 3.0*  CL 106  CO2 21*  GLUCOSE 92  BUN 24*   CREATININE 1.44*  CALCIUM 7.7*  AST 25  ALT 9  ALKPHOS 43  BILITOT 0.8   ------------------------------------------------------------------------------------------------------------------  Cardiac Enzymes Recent Labs  Lab 12/31/17 1112  TROPONINI 0.95*   ------------------------------------------------------------------------------------------------------------------  RADIOLOGY:  Dg Chest Portable 1 View  Result Date: 12/31/2017 CLINICAL DATA:  Generalized weakness. EXAM: PORTABLE CHEST 1 VIEW COMPARISON:  PA and lateral chest 02/17/2017 and 02/05/2017. FINDINGS: There is hazy airspace disease in the lingula. Right lung is clear. Heart size is mildly enlarged. No pneumothorax or pleural effusion. Aortic atherosclerosis is noted. No acute or focal bony abnormality. IMPRESSION: Hazy airspace disease in the lingula worrisome for pneumonia. Electronically Signed   By: Inge Rise M.D.   On: 12/31/2017 12:07     IMPRESSION AND PLAN:   *Non-ST elevation MI.  EKG showing left bundle branch block.  Admit to telemetry floor.  Start heparin drip.  Aspirin.  Nitro as needed. Cardiology consulted.  Dr. Clayborn Bigness made aware of consult. Check echocardiogram  *Left lower lobe pneumonia.  Patient does have leukocytosis and elevated lactic acid.  Check procalcitonin.  Start ceftriaxone and azithromycin.  *Hypertension.  Hold medications due to low normal blood pressure.  *DVT prophylaxis.  On heparin drip.   Advance care planning discussed.  Patient and her healthcare power of attorney  daughter Faith Rogue present at bedside.  Patient does not have documented advanced directives.  She has never had a discussion regarding her CODE STATUS.  Encouraged documentation.  We discussed regarding patient's non-ST elevation MI and being critically ill.  Patient understands.  All questions answered.  We discussed regarding CODE STATUS and patient initially wanted more time.  After giving some thought  and discussing with daughter she has decided to be a full code.  All the records are reviewed and case discussed with ED provider. Management plans discussed with the patient, family and they are in agreement.  CODE STATUS: Full code  TOTAL CRITICAL CARE TIME TAKING CARE OF THIS PATIENT: 80 minutes.   Leia Alf Iyah Laguna M.D on 12/31/2017 at 1:00 PM  Between 7am to 6pm - Pager - [phone]  After 6pm go to [email or web address] - password [email or web address] [facility]  Office  [phone]  CC: Primary care physician; Sofie Hartigan, MD  Note: This dictation was prepared with Dragon dictation along with smaller phrase technology. Any transcriptional errors that result from this process are unintentional.

## 2018-01-01 ENCOUNTER — Other Ambulatory Visit: Payer: Self-pay

## 2018-01-01 ENCOUNTER — Inpatient Hospital Stay
Admit: 2018-01-01 | Discharge: 2018-01-01 | Disposition: A | Discharge status: Home / Self Care | Payer: Medicare HMO | Attending: Internal Medicine | Admitting: Internal Medicine

## 2018-01-01 LAB — CBC
HCT: 26.1 % — ABNORMAL LOW (ref 35.0–47.0)
Hemoglobin: 9 g/dL — ABNORMAL LOW (ref 12.0–16.0)
MCH: 32.2 pg (ref 26.0–34.0)
MCHC: 34.5 g/dL (ref 32.0–36.0)
MCV: 93.3 fL (ref 80.0–100.0)
PLATELETS: 162 10*3/uL (ref 150–440)
RBC: 2.8 MIL/uL — ABNORMAL LOW (ref 3.80–5.20)
RDW: 14.3 % (ref 11.5–14.5)
WBC: 12.7 10*3/uL — ABNORMAL HIGH (ref 3.6–11.0)

## 2018-01-01 LAB — BASIC METABOLIC PANEL
Anion gap: 7 (ref 5–15)
BUN: 30 mg/dL — AB (ref 8–23)
CALCIUM: 7.4 mg/dL — AB (ref 8.9–10.3)
CHLORIDE: 111 mmol/L (ref 98–111)
CO2: 18 mmol/L — ABNORMAL LOW (ref 22–32)
CREATININE: 1.53 mg/dL — AB (ref 0.44–1.00)
GFR calc Af Amer: 33 mL/min — ABNORMAL LOW (ref >60)
GFR calc non Af Amer: 28 mL/min — ABNORMAL LOW (ref >60)
Glucose, Bld: 69 mg/dL — ABNORMAL LOW (ref 70–99)
Potassium: 3.7 mmol/L (ref 3.5–5.1)
Sodium: 136 mmol/L (ref 135–145)

## 2018-01-01 LAB — HEPARIN LEVEL (UNFRACTIONATED): Heparin Unfractionated: 0.27 IU/mL — ABNORMAL LOW (ref 0.30–0.70)

## 2018-01-01 LAB — MRSA PCR SCREENING: MRSA BY PCR: NEGATIVE

## 2018-01-01 LAB — MAGNESIUM: MAGNESIUM: 1.6 mg/dL — AB (ref 1.7–2.4)

## 2018-01-01 MED ORDER — DOXYCYCLINE HYCLATE 100 MG PO TABS: 100.0000 mg | ORAL_TABLET | Freq: Two times a day (BID) | ORAL | Status: DC

## 2018-01-01 MED ORDER — SODIUM CHLORIDE 0.9 % IV SOLN
500.0000 mg | Freq: Two times a day (BID) | INTRAVENOUS | Status: DC
  Administered 2018-01-01 – 2018-01-04 (×7): 500 mg via INTRAVENOUS
  Filled 2018-01-01 (×7): qty 500
  Filled 2018-01-01: qty 0.5

## 2018-01-01 MED ORDER — AMIODARONE LOAD VIA INFUSION
150.0000 mg | Freq: Once | INTRAVENOUS | Status: DC
  Filled 2018-01-01: qty 83.34

## 2018-01-01 MED ORDER — AMIODARONE HCL 200 MG PO TABS
200.0000 mg | ORAL_TABLET | Freq: Two times a day (BID) | ORAL | Status: DC
  Administered 2018-01-01 (×2): 200 mg via ORAL
  Filled 2018-01-01 (×3): qty 1

## 2018-01-01 MED ORDER — AZITHROMYCIN 250 MG PO TABS
500.0000 mg | ORAL_TABLET | Freq: Every day | ORAL | Status: AC
  Administered 2018-01-02 – 2018-01-05 (×4): 500 mg via ORAL
  Filled 2018-01-01 (×4): qty 2

## 2018-01-01 MED ORDER — HEPARIN SODIUM (PORCINE) 5000 UNIT/ML IJ SOLN
5000.0000 [IU] | Freq: Three times a day (TID) | INTRAMUSCULAR | Status: DC
  Administered 2018-01-01 – 2018-01-07 (×18): 5000 [IU] via SUBCUTANEOUS
  Filled 2018-01-01 (×17): qty 1

## 2018-01-01 MED ORDER — HEPARIN BOLUS VIA INFUSION
700.0000 [IU] | Freq: Once | INTRAVENOUS | Status: AC
  Administered 2018-01-01: 700 [IU] via INTRAVENOUS
  Filled 2018-01-01: qty 700

## 2018-01-01 MED ORDER — SODIUM CHLORIDE 0.9% FLUSH: 3.0000 mL | Freq: Two times a day (BID) | INTRAVENOUS | Status: DC

## 2018-01-01 MED ORDER — SODIUM CHLORIDE 0.9% FLUSH
3.0000 mL | Freq: Two times a day (BID) | INTRAVENOUS | Status: DC
  Administered 2018-01-01 – 2018-01-07 (×12): 3 mL via INTRAVENOUS

## 2018-01-01 MED ORDER — POTASSIUM CHLORIDE 10 MEQ/100ML IV SOLN
10.0000 meq | Freq: Once | INTRAVENOUS | Status: AC
  Administered 2018-01-01: 10 meq via INTRAVENOUS

## 2018-01-01 MED ORDER — MAGNESIUM SULFATE 2 GM/50ML IV SOLN
2.0000 g | Freq: Once | INTRAVENOUS | Status: AC
  Administered 2018-01-01: 2 g via INTRAVENOUS
  Filled 2018-01-01: qty 50

## 2018-01-01 MED ORDER — AMIODARONE IV BOLUS ONLY 150 MG/100ML
150.0000 mg | Freq: Once | INTRAVENOUS | Status: AC
  Administered 2018-01-01: 150 mg via INTRAVENOUS
  Filled 2018-01-01: qty 100

## 2018-01-01 MED ORDER — SODIUM CHLORIDE 0.9 % IV SOLN
INTRAVENOUS | Status: DC
  Administered 2018-01-01 (×2): via INTRAVENOUS

## 2018-01-01 NOTE — Care Management Note (Signed)
Case Management Note  Patient Details  Name: Colleen Nguyen MRN: [id number] Date of Birth: 11/01/1922  Subjective/Objective:      Patient from home.  Lives with daughter and her husband.  On acute HF O2.  No services in the home.  Has used [facility] in the past and would like them again.  Denies difficulties obtaining medications or with transportation.  Current with PCP.                Action/Plan: Heads up referral to [facility] for home health needs.   Expected Discharge Date:  01/03/18               Expected Discharge Plan:  [place]  In-House Referral:     Discharge planning Services  CM Consult  Post Acute Care Choice:  Home Health Choice offered to:     DME Arranged:    DME Agency:     HH Arranged:  RN, PT, Nurse's Aide, Social Work [employer] Agency:  [facility]  Status of Service:  In process, will continue to follow  If discussed at Long Length of Stay Meetings, dates discussed:    Additional Comments:  Elza Rafter, RN 01/01/2018, 5:41 PM

## 2018-01-01 NOTE — Progress Notes (Signed)
ANTICOAGULATION CONSULT NOTE - Initial Consult  Pharmacy Consult for heparin Indication: chest pain/ACS  Allergies  Allergen Reactions  . Carisoprodol Other (See Comments)    Reaction:  Hypersomnolence   . Phenergan [Promethazine] Other (See Comments)    Reaction:  Hallucinations   . Norco [Hydrocodone-Acetaminophen] Itching and Rash  . Pravastatin Rash    Patient Measurements: Height: 5\' 1"  (154.9 cm) Weight: 108 lb 12.8 oz (49.4 kg) IBW/kg (Calculated) : 47.8 Heparin Dosing Weight: 46.3 kg  Vital Signs: Temp: 98 F (36.7 C) (10/04 0332) Temp Source: Oral (10/04 0332) BP: 92/59 (10/04 0334) Pulse Rate: 71 (10/04 0334)  Labs: Recent Labs    12/31/17 1112 12/31/17 1522 12/31/17 2156 01/01/18 0538  HGB 9.2*  --   --  9.0*  HCT 26.9*  --   --  26.1*  PLT 174  --   --  162  APTT 35  --   --   --   LABPROT 16.0*  --   --   --   INR 1.29  --   --   --   HEPARINUNFRC  --   --  0.11* 0.27*  CREATININE 1.44*  --   --  1.53*  TROPONINI 0.95* 1.61* 1.79*  --     Estimated Creatinine Clearance: 17.7 mL/min (A) (by C-G formula based on SCr of 1.53 mg/dL (H)).   Medical History: Past Medical History:  Diagnosis Date  . Anemia   . Anxiety   . Atrial fibrillation ([place])   . Hypertension     Medications:  Infusions:  . sodium chloride 500 mL (01/01/18 0549)  . heparin 700 Units/hr (12/31/17 2239)  . meropenem (MERREM) IV    . vancomycin 500 mg (12/31/17 2354)    Assessment: 89 yof cc generalized weakness, hypotension, hypoxia. PMH significant for HTN, anemia, anxiety, AF. Initial troponin in ED noted to be 0.95. EKG with AF and LBBB, ventricular paced complexes. No PTA OAC noted but list is yet to be completed. Will follow up. Pharmacy consulted to manage heparin for ACS.  Goal of Therapy:  Heparin level 0.3-0.7 units/ml Monitor platelets by anticoagulation protocol: Yes   Plan:  Give 2800 units bolus x 1 Start heparin infusion at 550 units/hr Check anti-Xa  level in 8 hours and daily while on heparin Continue to monitor H&H and platelets   10/3:  HL @ 21:56 = 0.11 Will order Heparin 1400 units IV X 1 bolus and increase drip rate to 700 units/hr.  Will recheck HL 8 hrs after rate change.   10/04 AM heparin level 0.29. 700 unit bolus and increase rate to 800 units/hr. Recheck in 8 hours  Ambrosio Reuter S, Pharm.D Clinical Pharmacist 01/01/2018,6:35 AM

## 2018-01-01 NOTE — Progress Notes (Signed)
Amiodarone bolus given at 1010. Rate returned to 70's by 11:00.   200 mg Amiodarone po given at 1207.  HR elevated to 130's at around 1215 and continues at that rate. Dr. Vianne Bulls contacted.

## 2018-01-01 NOTE — Progress Notes (Signed)
BP 83/55. Asymptomatic. Running low all night.  Dr. Vianne Bulls informed.

## 2018-01-01 NOTE — Progress Notes (Signed)
Patient temperature 103.5 rectally, respirations averaging 25, crackles can be heard in the lung bases. Afib 90-120s. Last lactic 3.3. MD Jannifer Franklin notified. MD Jannifer Franklin to place orders.   Update: Patients temperature now 102.1 rectally. C/O of 8/10 L arm and abdominal pain. Patient unable to describe pain, just reports that "it hurts". Per MD Jannifer Franklin, she can receive pain medication if BP improves. MD to place orders for rectal tylenol.

## 2018-01-01 NOTE — Progress Notes (Signed)
*  PRELIMINARY RESULTS* Echocardiogram 2D Echocardiogram has been performed.  Colleen Nguyen 01/01/2018, 10:00 AM

## 2018-01-01 NOTE — Progress Notes (Signed)
[facility] [place] NAME: Colleen Nguyen    MR#:  [id number]  DATE OF BIRTH:  21-Jun-1922  SUBJECTIVE: Has some cough, mild shortness of breath noted to have wide-complex tachycardia with heart rate up to 136 bpm.  CHIEF COMPLAINT:   Chief Complaint  Patient presents with  . Hypotension  . Weakness  Admitted for sepsis due to pneumonia, found to have hypotension, slightly elevated troponins.  Started on heparin drip, patient noted to have wide-complex tachycardia this morning but she is not symptomatic.  Family mentioned that she is on metoprolol at home for heart rate control and could not get metoprolol here because of hypotension had hypokalemia on admission which was replaced.  Patient denies any chest pain or palpitations.  sHe appears comfortable. REVIEW OF SYSTEMS:   ROS CONSTITUTIONAL: No fever, fatigue or weakness.  EYES: No blurred or double vision.  EARS, NOSE, AND THROAT: No tinnitus or ear pain.  RESPIRATORY: Some cough, gurgling noted. CARDIOVASCULAR: No chest pain, orthopnea, edema.  GASTROINTESTINAL: No nausea, vomiting, diarrhea or abdominal pain.  GENITOURINARY: No dysuria, hematuria.  ENDOCRINE: No polyuria, nocturia,  HEMATOLOGY: No anemia, easy bruising or bleeding SKIN: No rash or lesion. MUSCULOSKELETAL: No joint pain or arthritis.   NEUROLOGIC: No tingling, numbness, weakness.  PSYCHIATRY: No anxiety or depression.   DRUG ALLERGIES:   Allergies  Allergen Reactions  . Carisoprodol Other (See Comments)    Reaction:  Hypersomnolence   . Phenergan [Promethazine] Other (See Comments)    Reaction:  Hallucinations   . Norco [Hydrocodone-Acetaminophen] Itching and Rash  . Pravastatin Rash    VITALS:  Blood pressure (!) 85/44, pulse (!) 135, temperature 98 F (36.7 C), temperature source Oral, resp. rate (!) 24, height 5\' 1"  (1.549 m), weight 49.4 kg, SpO2 98 %.  PHYSICAL EXAMINATION:  GENERAL:  84  y.o.-year-old patient lying in the bed with no acute distress.  Appears frail EYES: Pupils equal, round, reactive to light and accommodation. No scleral icterus. Extraocular muscles intact.  HEENT: Head atraumatic, normocephalic. Oropharynx and nasopharynx clear.  NECK:  Supple, no jugular venous distention. No thyroid enlargement, no tenderness.  LUNGS:  diminished air entry bilateral but no rhonchi or wheeze.  CARDIOVASCULAR: S1, S2 normal. No murmurs, rubs, or gallops.  ABDOMEN: Soft, nontender, nondistended. Bowel sounds present. No organomegaly or mass.  EXTREMITIES: No pedal edema, cyanosis, or clubbing.  NEUROLOGIC: Cranial nerves II through XII are intact. Muscle strength 5/5 in all extremities. Sensation intact. Gait not checked.  PSYCHIATRIC: The patient is alert and oriented x 3.  SKIN: No obvious rash, lesion, or ulcer.    LABORATORY PANEL:   CBC Recent Labs  Lab 01/01/18 0538  WBC 12.7*  HGB 9.0*  HCT 26.1*  PLT 162   ------------------------------------------------------------------------------------------------------------------  Chemistries  Recent Labs  Lab 12/31/17 1112 01/01/18 0538  NA 138 136  K 3.0* 3.7  CL 106 111  CO2 21* 18*  GLUCOSE 92 69*  BUN 24* 30*  CREATININE 1.44* 1.53*  CALCIUM 7.7* 7.4*  MG  --  1.6*  AST 25  --   ALT 9  --   ALKPHOS 43  --   BILITOT 0.8  --    ------------------------------------------------------------------------------------------------------------------  Cardiac Enzymes Recent Labs  Lab 12/31/17 2156  TROPONINI 1.79*   ------------------------------------------------------------------------------------------------------------------  RADIOLOGY:  Dg Chest Port 1 View  Result Date: 12/31/2017 CLINICAL DATA:  Cough EXAM: PORTABLE CHEST 1 VIEW COMPARISON:  12/31/2017 FINDINGS: Cardiomegaly with mild  interstitial edema and small left pleural effusion. Mild patchy left lower lobe opacity, likely atelectasis,  pneumonia not excluded. No pneumothorax. IMPRESSION: Cardiomegaly with mild interstitial edema and small left pleural effusion. Mild patchy left lower lobe opacity, atelectasis versus pneumonia. Electronically Signed   By: Julian Hy M.D.   On: 12/31/2017 21:45   Dg Chest Portable 1 View  Result Date: 12/31/2017 CLINICAL DATA:  Generalized weakness. EXAM: PORTABLE CHEST 1 VIEW COMPARISON:  PA and lateral chest 02/17/2017 and 02/05/2017. FINDINGS: There is hazy airspace disease in the lingula. Right lung is clear. Heart size is mildly enlarged. No pneumothorax or pleural effusion. Aortic atherosclerosis is noted. No acute or focal bony abnormality. IMPRESSION: Hazy airspace disease in the lingula worrisome for pneumonia. Electronically Signed   By: Inge Rise M.D.   On: 12/31/2017 12:07    EKG:   Orders placed or performed during the hospital encounter of 12/31/17  . ED EKG  . ED EKG  . EKG 12-Lead  . EKG 12-Lead  . EKG 12-Lead  . EKG 12-Lead  . EKG 12-Lead  . EKG 12-Lead    ASSESSMENT AND PLAN:   Sepsis present on admission secondary to pneumonia: Follow blood cultures, continue IV fluids, follow clinical course patient had high fever to 102 Fahrenheit yesterday but no further fevers but continues to have hypotension so continue IV fluids.  Patient clinically looks dry. 2.  Wide-complex tachycardia likely due to underlying sepsis with dehydration and hypokalemia: Continue IV fluids, replace potassium, magnesium, spoke with Dr. Clayborn Bigness is cardiologist on call, he will see the patient, recommended to control underlying infection, we will give 1 dose of amiodarone and patient could not get beta-blockers because of hypotension. 3.  History of proximal atrial fibrillation with some PACs, PVC, seen by Dr. Nehemiah Massed, started on beta-blockers as an outpatient but because of hypotension he could not get beta-blockers, restart amiodarone p.o. while she is here as patient daughter mentioned  that she has been dealing with hypotension episodes even at home.  50% of the time spent in counseling, coordination of care, discussed with patient's daughter and son.   All the records are reviewed and case discussed with Care Management/Social Workerr. Management plans discussed with the patient, family and they are in agreement.  CODE STATUS: full  TOTAL TIME TAKING CARE OF THIS PATIENT:45 minutes.   POSSIBLE D/C IN 1-2 DAYS, DEPENDING ON CLINICAL CONDITION.   Epifanio Lesches M.D on 01/01/2018 at 10:10 AM  Between 7am to 6pm - Pager - [phone]  After 6pm go to [email or web address] - password [email or web address] [facility]  Office  [phone]  CC: Primary care physician; Sofie Hartigan, MD   Note: This dictation was prepared with Dragon dictation along with smaller phrase technology. Any transcriptional errors that result from this process are unintentional.

## 2018-01-01 NOTE — Progress Notes (Signed)
Chaplain responded to a [place] OR and from [place] morning report. Pt was getting a breathing treatment with family at the bedside. Family said they had to discuss with Pt and rest of the family. Chaplain asked that they page Chaplain when ready to move further.

## 2018-01-01 NOTE — Progress Notes (Signed)
IV potassium order expired before last bag could be given due to patient tolerance. IV infusion had to be decreased in order better tolerance. Unable to scan last bag, pharmacy notified. Per Matt (Pharmascist) place new order for bag of potassium that wasn't given.

## 2018-01-02 LAB — CBC
HEMATOCRIT: 27.1 % — AB (ref 35.0–47.0)
HEMOGLOBIN: 8.7 g/dL — AB (ref 12.0–16.0)
MCH: 30.2 pg (ref 26.0–34.0)
MCHC: 32.1 g/dL (ref 32.0–36.0)
MCV: 94 fL (ref 80.0–100.0)
Platelets: 186 10*3/uL (ref 150–440)
RBC: 2.89 MIL/uL — ABNORMAL LOW (ref 3.80–5.20)
RDW: 15.2 % — ABNORMAL HIGH (ref 11.5–14.5)
WBC: 11.9 10*3/uL — AB (ref 3.6–11.0)

## 2018-01-02 LAB — BASIC METABOLIC PANEL
Anion gap: 5 (ref 5–15)
BUN: 34 mg/dL — ABNORMAL HIGH (ref 8–23)
CO2: 19 mmol/L — ABNORMAL LOW (ref 22–32)
Calcium: 7.5 mg/dL — ABNORMAL LOW (ref 8.9–10.3)
Chloride: 113 mmol/L — ABNORMAL HIGH (ref 98–111)
Creatinine, Ser: 1.52 mg/dL — ABNORMAL HIGH (ref 0.44–1.00)
GFR calc Af Amer: 33 mL/min — ABNORMAL LOW (ref >60)
GFR, EST NON AFRICAN AMERICAN: 29 mL/min — AB (ref >60)
Glucose, Bld: 67 mg/dL — ABNORMAL LOW (ref 70–99)
POTASSIUM: 3.4 mmol/L — AB (ref 3.5–5.1)
SODIUM: 137 mmol/L (ref 135–145)

## 2018-01-02 LAB — INFLUENZA PANEL BY PCR (TYPE A & B)
Influenza A By PCR: NEGATIVE
Influenza B By PCR: NEGATIVE

## 2018-01-02 LAB — MAGNESIUM: Magnesium: 2.3 mg/dL (ref 1.7–2.4)

## 2018-01-02 MED ORDER — DIGOXIN 0.25 MG/ML IJ SOLN
0.5000 mg | Freq: Once | INTRAMUSCULAR | Status: AC
  Administered 2018-01-02: 0.5 mg via INTRAVENOUS
  Filled 2018-01-02: qty 2

## 2018-01-02 MED ORDER — AMIODARONE HCL 200 MG PO TABS
400.0000 mg | ORAL_TABLET | Freq: Two times a day (BID) | ORAL | Status: DC
  Administered 2018-01-02: 400 mg via ORAL
  Filled 2018-01-02: qty 2

## 2018-01-02 MED ORDER — POTASSIUM CHLORIDE CRYS ER 20 MEQ PO TBCR
40.0000 meq | EXTENDED_RELEASE_TABLET | Freq: Once | ORAL | Status: AC
  Administered 2018-01-02: 40 meq via ORAL
  Filled 2018-01-02: qty 2

## 2018-01-02 MED ORDER — GUAIFENESIN-DM 100-10 MG/5ML PO SYRP
5.0000 mL | ORAL_SOLUTION | ORAL | Status: DC | PRN
  Administered 2018-01-03: 5 mL via ORAL
  Filled 2018-01-02: qty 5

## 2018-01-02 MED ORDER — AMIODARONE HCL 200 MG PO TABS
200.0000 mg | ORAL_TABLET | Freq: Two times a day (BID) | ORAL | Status: DC
  Administered 2018-01-02 – 2018-01-03 (×2): 200 mg via ORAL
  Filled 2018-01-02 (×2): qty 1

## 2018-01-02 MED ORDER — DIGOXIN 0.25 MG/ML IJ SOLN
0.2500 mg | Freq: Once | INTRAMUSCULAR | Status: AC
  Administered 2018-01-02: 0.25 mg via INTRAVENOUS
  Filled 2018-01-02: qty 2

## 2018-01-02 MED ORDER — ASPIRIN 325 MG PO TABS
325.0000 mg | ORAL_TABLET | Freq: Every day | ORAL | Status: DC
  Administered 2018-01-07: 325 mg via ORAL
  Filled 2018-01-02 (×6): qty 1

## 2018-01-02 MED ORDER — SODIUM CHLORIDE 0.9 % IV SOLN
INTRAVENOUS | Status: AC
  Administered 2018-01-02: 14:00:00 via INTRAVENOUS

## 2018-01-02 MED ORDER — DIGOXIN 0.25 MG/ML IJ SOLN
0.1250 mg | Freq: Every day | INTRAMUSCULAR | Status: DC
  Administered 2018-01-03: 0.125 mg via INTRAVENOUS
  Filled 2018-01-02: qty 2

## 2018-01-02 NOTE — Consult Note (Signed)
Reason for Consult: Wide-complex tachycardia A. fib hypotension and troponin Referring Physician: Dr. Darvin Neighbours hospitalist Cardiologist Dr. Donny Pique Colleen Nguyen is an 84 y.o. female.  HPI: Patient is a 83 year old white female known atrial fibrillation now presents with left bundle branch block and borderline troponins history of hypertension but now hypotensive history of anxiety presented with acute shortness of breath generalized weakness.  Upon evaluation emergency room she was found to have a left lower lobe pneumonia as well as borderline troponin of 0.95.  It was thought that she probably did not have a STEMI but might be having sepsis she had systolic blood pressures of 70.  Denies any significant chest pain now here for follow-up evaluation.  Patient now with sepsis presentation no chest pain hypotension no fever but significant wide-complex tachycardia suggestive of A. fib with irregularity denies any palpitations  Past Medical History:  Diagnosis Date  . Anemia   . Anxiety   . Atrial fibrillation ([place])   . Hypertension     Past Surgical History:  Procedure Laterality Date  . ABDOMINAL HYSTERECTOMY    . APPENDECTOMY    . COLONOSCOPY WITH PROPOFOL N/A 07/23/2015   Procedure: COLONOSCOPY WITH PROPOFOL;  Surgeon: Lucilla Lame, MD;  Location: [facility] ENDOSCOPY;  Service: Endoscopy;  Laterality: N/A;  . ESOPHAGOGASTRODUODENOSCOPY (EGD) WITH PROPOFOL N/A 07/23/2015   Procedure: ESOPHAGOGASTRODUODENOSCOPY (EGD) WITH PROPOFOL;  Surgeon: Lucilla Lame, MD;  Location: [facility] ENDOSCOPY;  Service: Endoscopy;  Laterality: N/A;    Family History  Problem Relation Age of Onset  . Leukemia Mother   . Stroke Father   . CAD Brother     Social History:  reports that she has never smoked. She has never used smokeless tobacco. She reports that she does not drink alcohol or use drugs.  Allergies:  Allergies  Allergen Reactions  . Carisoprodol Other (See Comments)    Reaction:  Hypersomnolence   .  Phenergan [Promethazine] Other (See Comments)    Reaction:  Hallucinations   . Norco [Hydrocodone-Acetaminophen] Itching and Rash  . Pravastatin Rash    Medications: I have reviewed the patient's current medications.  Results for orders placed or performed during the hospital encounter of 12/31/17 (from the past 48 hour(s))  Heparin level (unfractionated)     Status: Abnormal   Collection Time: 12/31/17  9:56 PM  Result Value Ref Range   Heparin Unfractionated 0.11 (L) 0.30 - 0.70 IU/mL    Comment: (NOTE) If heparin results are below expected values, and patient dosage has  been confirmed, suggest follow up testing of antithrombin III levels. Performed at [facility], [address] [place], [place] [address]   Troponin I     Status: Abnormal   Collection Time: 12/31/17  9:56 PM  Result Value Ref Range   Troponin I 1.79 (HH) <0.03 ng/mL    Comment: CRITICAL VALUE NOTED. VALUE IS CONSISTENT WITH PREVIOUSLY REPORTED/CALLED VALUE AKT Performed at [facility], [address] [place], [place] [address]   Culture, blood (Routine X 2) w Reflex to ID Panel     Status: None (Preliminary result)   Collection Time: 12/31/17  9:56 PM  Result Value Ref Range   Specimen Description BLOOD RIGHT ASSIST CONTROL    Special Requests      BOTTLES DRAWN AEROBIC AND ANAEROBIC Blood Culture adequate volume   Culture      NO GROWTH 2 DAYS Performed at [facility], [address] [place], [place] [address]    Report Status PENDING  Culture, blood (Routine X 2) w Reflex to ID Panel     Status: None (Preliminary result)   Collection Time: 12/31/17  9:56 PM  Result Value Ref Range   Specimen Description BLOOD RIGHT ARM    Special Requests      BOTTLES DRAWN AEROBIC AND ANAEROBIC Blood Culture adequate volume   Culture      NO GROWTH 2 DAYS Performed at [facility], [address], [place], [place] [address]    Report Status PENDING   Lactic acid,  plasma     Status: None   Collection Time: 12/31/17  9:56 PM  Result Value Ref Range   Lactic Acid, Venous 1.2 0.5 - 1.9 mmol/L    Comment: Performed at [facility], [address] [place], [place] [address]  Basic metabolic panel     Status: Abnormal   Collection Time: 01/01/18  5:38 AM  Result Value Ref Range   Sodium 136 135 - 145 mmol/L   Potassium 3.7 3.5 - 5.1 mmol/L   Chloride 111 98 - 111 mmol/L   CO2 18 (L) 22 - 32 mmol/L   Glucose, Bld 69 (L) 70 - 99 mg/dL   BUN 30 (H) 8 - 23 mg/dL   Creatinine, Ser 1.53 (H) 0.44 - 1.00 mg/dL   Calcium 7.4 (L) 8.9 - 10.3 mg/dL   GFR calc non Af Amer 28 (L) >60 mL/min   GFR calc Af Amer 33 (L) >60 mL/min    Comment: (NOTE) The eGFR has been calculated using the CKD EPI equation. This calculation has not been validated in all clinical situations. eGFR's persistently <60 mL/min signify possible Chronic Kidney Disease.    Anion gap 7 5 - 15    Comment: Performed at [facility], [address] [place], [place] [address]  CBC     Status: Abnormal   Collection Time: 01/01/18  5:38 AM  Result Value Ref Range   WBC 12.7 (H) 3.6 - 11.0 K/uL   RBC 2.80 (L) 3.80 - 5.20 MIL/uL   Hemoglobin 9.0 (L) 12.0 - 16.0 g/dL   HCT 26.1 (L) 35.0 - 47.0 %   MCV 93.3 80.0 - 100.0 fL   MCH 32.2 26.0 - 34.0 pg   MCHC 34.5 32.0 - 36.0 g/dL   RDW 14.3 11.5 - 14.5 %   Platelets 162 150 - 440 K/uL    Comment: Performed at [facility], [address] [place], [place] [address]  Heparin level (unfractionated)     Status: Abnormal   Collection Time: 01/01/18  5:38 AM  Result Value Ref Range   Heparin Unfractionated 0.27 (L) 0.30 - 0.70 IU/mL    Comment: (NOTE) If heparin results are below expected values, and patient dosage has  been confirmed, suggest follow up testing of antithrombin III levels. Performed at [facility], [address] [place], [place] [address]   Magnesium     Status: Abnormal   Collection  Time: 01/01/18  5:38 AM  Result Value Ref Range   Magnesium 1.6 (L) 1.7 - 2.4 mg/dL    Comment: Performed at [facility], [address] [place], [place]leen [place] [address]  MRSA PCR Screening     Status: None   Collection Time: 01/01/18 12:36 PM  Result Value Ref Range   MRSA by PCR NEGATIVE NEGATIVE    Comment:        The GeneXpert MRSA Assay (FDA approved for NASAL specimens only), is one component of a  comprehensive MRSA colonization surveillance program. It is not intended to diagnose MRSA infection nor to guide or monitor treatment for MRSA infections. Performed at [facility], [address] [place], [place] [address]   Influenza panel by PCR (type A & B)     Status: None   Collection Time: 01/02/18  1:07 AM  Result Value Ref Range   Influenza A By PCR NEGATIVE NEGATIVE   Influenza B By PCR NEGATIVE NEGATIVE    Comment: (NOTE) The Xpert Xpress Flu assay is intended as an aid in the diagnosis of  influenza and should not be used as a sole basis for treatment.  This  assay is FDA approved for nasopharyngeal swab specimens only. Nasal  washings and aspirates are unacceptable for Xpert Xpress Flu testing. Performed at [facility], [address] [place], [place] [address]   Magnesium     Status: None   Collection Time: 01/02/18  3:40 AM  Result Value Ref Range   Magnesium 2.3 1.7 - 2.4 mg/dL    Comment: Performed at [facility], [address] [place], [place] [address]  Basic metabolic panel     Status: Abnormal   Collection Time: 01/02/18  3:40 AM  Result Value Ref Range   Sodium 137 135 - 145 mmol/L   Potassium 3.4 (L) 3.5 - 5.1 mmol/L   Chloride 113 (H) 98 - 111 mmol/L   CO2 19 (L) 22 - 32 mmol/L   Glucose, Bld 67 (L) 70 - 99 mg/dL   BUN 34 (H) 8 - 23 mg/dL   Creatinine, Ser 1.52 (H) 0.44 - 1.00 mg/dL   Calcium 7.5 (L) 8.9 - 10.3 mg/dL   GFR calc non Af Amer 29 (L) >60 mL/min   GFR calc Af Amer 33 (L) >60 mL/min     Comment: (NOTE) The eGFR has been calculated using the CKD EPI equation. This calculation has not been validated in all clinical situations. eGFR's persistently <60 mL/min signify possible Chronic Kidney Disease.    Anion gap 5 5 - 15    Comment: Performed at [facility], [address] [place], [place] [address]  CBC     Status: Abnormal   Collection Time: 01/02/18  3:40 AM  Result Value Ref Range   WBC 11.9 (H) 3.6 - 11.0 K/uL   RBC 2.89 (L) 3.80 - 5.20 MIL/uL   Hemoglobin 8.7 (L) 12.0 - 16.0 g/dL   HCT 27.1 (L) 35.0 - 47.0 %   MCV 94.0 80.0 - 100.0 fL   MCH 30.2 26.0 - 34.0 pg   MCHC 32.1 32.0 - 36.0 g/dL   RDW 15.2 (H) 11.5 - 14.5 %   Platelets 186 150 - 440 K/uL    Comment: Performed at [facility], [address] [place], [place] [address]    Dg Chest Port 1 View  Result Date: 12/31/2017 CLINICAL DATA:  Cough EXAM: PORTABLE CHEST 1 VIEW COMPARISON:  12/31/2017 FINDINGS: Cardiomegaly with mild interstitial edema and small left pleural effusion. Mild patchy left lower lobe opacity, likely atelectasis, pneumonia not excluded. No pneumothorax. IMPRESSION: Cardiomegaly with mild interstitial edema and small left pleural effusion. Mild patchy left lower lobe opacity, atelectasis versus pneumonia. Electronically Signed   By: Julian Hy M.D.   On: 12/31/2017 21:45    Review of Systems  Constitutional: Positive for diaphoresis, fever, malaise/fatigue and weight loss.  HENT: Positive for congestion.   Eyes: Negative.   Respiratory: Positive for shortness of breath.  Cardiovascular: Negative.   Gastrointestinal: Negative.   Genitourinary: Negative.   Musculoskeletal: Positive for back pain.  Skin: Negative.   Neurological: Positive for weakness.  Endo/Heme/Allergies: Negative.   Psychiatric/Behavioral: Negative.    Blood pressure (!) 117/97, pulse (!) 102, temperature 98.4 F (36.9 C), temperature source Oral, resp. rate 18, height 5' 1"  (1.549  m), weight 51 kg, SpO2 97 %. Physical Exam  Nursing note and vitals reviewed. Constitutional: She is oriented to person, place, and time. She appears well-developed and well-nourished.  HENT:  Head: Normocephalic and atraumatic.  Eyes: Pupils are equal, round, and reactive to light. Conjunctivae and EOM are normal.  Neck: Normal range of motion. Neck supple.  Cardiovascular: Normal rate, regular rhythm and normal heart sounds.  Respiratory: Effort normal and breath sounds normal.  GI: Soft. Bowel sounds are normal.  Musculoskeletal: Normal range of motion.  Neurological: She is alert and oriented to person, place, and time. She has normal reflexes.  Skin: Skin is warm and dry.  Psychiatric: She has a normal mood and affect.    Assessment/Plan: Sepsis Pneumonia Weakness Anemia Anxiety Atrial fibrillation Bundle branch block Hypotension GERD Chronic renal insufficiency Borderline troponins possible minimal non-STEMI . Plan Agree with admit to telemetry Follow-up EKGs troponin Broad-spectrum antibiotic therapy for sepsis Fluid hydration for hypotension Follow-up troponins Rate controlled for atrial fibrillation Consider amiodarone Start low-dose digoxin intravenously Unable to tolerate beta-blocker or calcium blocker because of hypotension Doubt non-STEMI this looks more like demand ischemia Recommend DVT prophylaxis not ACS anticoagulation Echocardiogram be helpful for further assessment  Colleen Nguyen 01/02/2018, 4:39 PM

## 2018-01-02 NOTE — Progress Notes (Signed)
Advanced Care Plan.  Purpose of Encounter: CODE STATUS. Parties in Attendance: The patient, her daughter and me. Patient's Decisional Capacity: Yes. Medical Story: Colleen Nguyen  is a 84 y.o. female with a known history of atrial fibrillation, hypertension, anxiety presents to the hospital with acute onset of shortness of breath and generalized weakness.  She is admitted for acute respiratory failure with hypoxia due to sepsis with pneumonia; elevated troponin, acute renal failure due to dehydration and A. fib with RVR.  I discussed with the patient about her current condition, prognosis and CODE STATUS.  She wants to be resuscitated and intubated if necessary to get her back. Plan:  Code Status: Full code. Time spent discussing advance care planning: 17 minutes.

## 2018-01-02 NOTE — Progress Notes (Addendum)
[place] at [place] NAME: Colleen Nguyen    MR#:  [id number]  DATE OF BIRTH:  1922/09/04  SUBJECTIVE: Has some cough, mild shortness of breath noted to have wide-complex tachycardia with heart rate up to 136 bpm.  CHIEF COMPLAINT:   Chief Complaint  Patient presents with  . Hypotension  . Weakness   The patient has shortness of breath and cough, on oxygen by nasal cannula 5 L. REVIEW OF SYSTEMS:   ROS CONSTITUTIONAL: No fever, fatigue or weakness.  EYES: No blurred or double vision.  EARS, NOSE, AND THROAT: No tinnitus or ear pain.  RESPIRATORY: Some cough, gurgling noted. CARDIOVASCULAR: No chest pain, orthopnea, edema.  GASTROINTESTINAL: No nausea, vomiting, diarrhea or abdominal pain.  GENITOURINARY: No dysuria, hematuria.  ENDOCRINE: No polyuria, nocturia,  HEMATOLOGY: No anemia, easy bruising or bleeding SKIN: No rash or lesion. MUSCULOSKELETAL: No joint pain or arthritis.   NEUROLOGIC: No tingling, numbness, weakness.  PSYCHIATRY: No anxiety or depression.   DRUG ALLERGIES:   Allergies  Allergen Reactions  . Carisoprodol Other (See Comments)    Reaction:  Hypersomnolence   . Phenergan [Promethazine] Other (See Comments)    Reaction:  Hallucinations   . Norco [Hydrocodone-Acetaminophen] Itching and Rash  . Pravastatin Rash    VITALS:  Blood pressure (!) 117/97, pulse (!) 102, temperature 98.4 F (36.9 C), temperature source Oral, resp. rate 18, height 5\' 1"  (1.549 m), weight 51 kg, SpO2 97 %.  PHYSICAL EXAMINATION:  GENERAL:  84 y.o.-year-old patient lying in the bed with no acute distress.  Appears frail EYES: Pupils equal, round, reactive to light and accommodation. No scleral icterus. Extraocular muscles intact.  HEENT: Head atraumatic, normocephalic. Oropharynx and nasopharynx clear.  NECK:  Supple, no jugular venous distention. No thyroid enlargement, no tenderness.  LUNGS:  diminished air entry  bilateral but no rhonchi or wheeze.  CARDIOVASCULAR: S1, S2 normal. No murmurs, rubs, or gallops.  ABDOMEN: Soft, nontender, nondistended. Bowel sounds present. No organomegaly or mass.  EXTREMITIES: No pedal edema, cyanosis, or clubbing.  NEUROLOGIC: Cranial nerves II through XII are intact. Muscle strength 5/5 in all extremities. Sensation intact. Gait not checked.  PSYCHIATRIC: The patient is alert and oriented x 3.  SKIN: No obvious rash, lesion, or ulcer.    LABORATORY PANEL:   CBC Recent Labs  Lab 01/02/18 0340  WBC 11.9*  HGB 8.7*  HCT 27.1*  PLT 186   ------------------------------------------------------------------------------------------------------------------  Chemistries  Recent Labs  Lab 12/31/17 1112  01/02/18 0340  NA 138   < > 137  K 3.0*   < > 3.4*  CL 106   < > 113*  CO2 21*   < > 19*  GLUCOSE 92   < > 67*  BUN 24*   < > 34*  CREATININE 1.44*   < > 1.52*  CALCIUM 7.7*   < > 7.5*  MG  --    < > 2.3  AST 25  --   --   ALT 9  --   --   ALKPHOS 43  --   --   BILITOT 0.8  --   --    < > = values in this interval not displayed.   ------------------------------------------------------------------------------------------------------------------  Cardiac Enzymes Recent Labs  Lab 12/31/17 2156  TROPONINI 1.79*   ------------------------------------------------------------------------------------------------------------------  RADIOLOGY:  Dg Chest Port 1 View  Result Date: 12/31/2017 CLINICAL DATA:  Cough EXAM: PORTABLE CHEST 1 VIEW COMPARISON:  12/31/2017 FINDINGS: Cardiomegaly  with mild interstitial edema and small left pleural effusion. Mild patchy left lower lobe opacity, likely atelectasis, pneumonia not excluded. No pneumothorax. IMPRESSION: Cardiomegaly with mild interstitial edema and small left pleural effusion. Mild patchy left lower lobe opacity, atelectasis versus pneumonia. Electronically Signed   By: Julian Hy M.D.   On: 12/31/2017  21:45    EKG:   Orders placed or performed during the hospital encounter of 12/31/17  . ED EKG  . ED EKG  . EKG 12-Lead  . EKG 12-Lead  . EKG 12-Lead  . EKG 12-Lead  . EKG 12-Lead  . EKG 12-Lead  . EKG 12-Lead  . EKG 12-Lead  . EKG 12-Lead  . EKG 12-Lead  . EKG 12-Lead  . EKG 12-Lead  . EKG 12-Lead  . EKG 12-Lead    ASSESSMENT AND PLAN:   1. Acute respiratory failure with hypoxia due to sepsis with pneumonia. Continue oxygen by nasal cannula, DuoNeb, Robitussin as needed. Continue Zithromax and meropenem, follow-up blood cultures.  Leukocytosis improved.  Lactic acidosis.  Improved.  2.  ARF due to dehydration  Continue IV fluid support.  Follow-up BMP.  Hypokalemia, replace potassium, follow-up level. Hypomagnesemia.  Improved with magnesium supplement.  3. Afib with RVR due to above.  History of proximal atrial fibrillation with some PACs, PVC, seen by Dr. Nehemiah Massed, started on beta-blockers as an outpatient but because of hypotension he could not get beta-blockers. Continue amiodarone p.o. twice daily, start digoxin per Dr. Clayborn Bigness.  4.  Elevated troponin, possible due to demanding ischemia. The patient was on heparin drip.  Dr. called agreed to starting aspirin.  Generalized weakness.  PT daily. I discussed with Dr. Clayborn Bigness. 50% of the time spent in counseling, coordination of care, discussed with patient's daughter. All the records are reviewed and case discussed with Care Management/Social Workerr. Management plans discussed with the patient, family and they are in agreement.  CODE STATUS: full  TOTAL TIME TAKING CARE OF THIS PATIENT: 35 minutes.   POSSIBLE D/C IN 2-3 DAYS, DEPENDING ON CLINICAL CONDITION.   Demetrios Loll M.D on 01/02/2018 at 4:28 PM  Between 7am to 6pm - Pager - [phone]  After 6pm go to [email or web address] - password [email or web address] [facility]  Office  [phone]  CC: Primary care physician; Sofie Hartigan,  MD   Note: This dictation was prepared with Dragon dictation along with smaller phrase technology. Any transcriptional errors that result from this process are unintentional.

## 2018-01-02 NOTE — Progress Notes (Signed)
Patients HR continues to be elevated after a.m. amio dose, Dr. Clayborn Bigness notified and received verbal orders for IV Dig. HR 134, BP 102/68. SPO 95 % on HFNC. Will administer and continue to monitor.

## 2018-01-02 NOTE — Progress Notes (Signed)
Per CCMD, Pt had a 3.33 sec pause, Dr. Clayborn Bigness on the floor and made aware. Per Dr. Clayborn Bigness decrease dose of amio to 200 mg. Will continue to monitor.

## 2018-01-03 LAB — BASIC METABOLIC PANEL
ANION GAP: 4 — AB (ref 5–15)
BUN: 30 mg/dL — ABNORMAL HIGH (ref 8–23)
CALCIUM: 7.7 mg/dL — AB (ref 8.9–10.3)
CO2: 19 mmol/L — ABNORMAL LOW (ref 22–32)
Chloride: 115 mmol/L — ABNORMAL HIGH (ref 98–111)
Creatinine, Ser: 1.23 mg/dL — ABNORMAL HIGH (ref 0.44–1.00)
GFR, EST AFRICAN AMERICAN: 43 mL/min — AB (ref >60)
GFR, EST NON AFRICAN AMERICAN: 37 mL/min — AB (ref >60)
GLUCOSE: 88 mg/dL (ref 70–99)
Potassium: 3.8 mmol/L (ref 3.5–5.1)
SODIUM: 138 mmol/L (ref 135–145)

## 2018-01-03 MED ORDER — PHENOL 1.4 % MT LIQD
1.0000 | OROMUCOSAL | Status: DC | PRN
  Administered 2018-01-03 – 2018-01-04 (×2): 1 via OROMUCOSAL
  Filled 2018-01-03: qty 177

## 2018-01-03 NOTE — Plan of Care (Signed)
  Problem: Clinical Measurements: Goal: Ability to maintain clinical measurements within normal limits will improve Outcome: Progressing Goal: Cardiovascular complication will be avoided Outcome: Progressing   Problem: Elimination: Goal: Will not experience complications related to bowel motility Outcome: Progressing   Problem: Safety: Goal: Ability to remain free from injury will improve Outcome: Progressing   

## 2018-01-03 NOTE — Progress Notes (Signed)
Physical Therapy Evaluation Patient Details Name: Colleen Nguyen MRN: [id number] DOB: 04/21/1922 Today's Date: 01/03/2018   History of Present Illness  Pt admitted for Non-ST elevation MI on 12/31/2017 with sx on arrival to ED of hypotension, generalized weakness, and SOB. EKG showed L bundle branch block. Imaging showed cardiomegaly with mild interstitial edema and small L pleural effusion, L lower lobe pneumonia possible. On 10/5 pt presents with signs and sx of sepsis, no chest pain, hypotension, no fever, and wide complex tachycardia suggestive of A-fib. Pt's PMH includes A-fib, HTN, anemia, and axiety.  Clinical Impression  Pt is a pleasant 84 year old female who was admitted for Non-ST elevation MI. Pt performed ther-ex with Mod A. Pt educated on HEP including ankle pumps and heel slides. Pt performs bed mobility with max assist. HR and O2 monitored t/o session. HR: baseline 104, with ther-ex 94-110, with bed mobility 99-125 bpm. O2 stats remain between 90-92 t/o session. Pt demonstrates deficits with UE and LE strength (R < L strength) and mobility. Pt is not at her baseline. Would benefit from skilled PT to address above deficits and promote optimal return to PLOF.     Follow Up Recommendations SNF    Equipment Recommendations  None recommended by PT    Recommendations for Other Services OT consult     Precautions / Restrictions Precautions Precautions: Fall Restrictions Weight Bearing Restrictions: No      Mobility  Bed Mobility Overal bed mobility: Needs Assistance Bed Mobility: Rolling Rolling: Max assist         General bed mobility comments: Pt demonstrates ability to disassociate trunk reaching for opposite handle with RUE. Requires assist to manage trunk and LE to roll on L side. Great effort required for mobility, HR in 120s.  Transfers                 General transfer comment: Did not progress d/t in HR with bed mobility.  Ambulation/Gait                 Stairs            Wheelchair Mobility    Modified Rankin (Stroke Patients Only)       Balance                                             Pertinent Vitals/Pain Pain Assessment: Faces Faces Pain Scale: Hurts even more Pain Location: L arm and L leg Pain Descriptors / Indicators: Guarding;Grimacing Pain Intervention(s): Limited activity within patient's tolerance    Home Living Family/patient expects to be discharged to:: Private residence Living Arrangements: Children(Daughter and son-in-law) Available Help at Discharge: Family Type of Home: House Home Access: Stairs to enter Entrance Stairs-Rails: Can reach both Entrance Stairs-Number of Steps: 8 Home Layout: One level Home Equipment: Walker - 4 wheels      Prior Function Level of Independence: Needs assistance   Gait / Transfers Assistance Needed: Uses rollator for amb in the home.  ADL's / Homemaking Assistance Needed: Pt has assist with ADL's and IADL's        Hand Dominance   Dominant Hand: Right    Extremity/Trunk Assessment   Upper Extremity Assessment Upper Extremity Assessment: Generalized weakness(RUE 2+/5, LUE 2-/5 pain with movement)    Lower Extremity Assessment Lower Extremity Assessment: Generalized weakness(RLE 3-/5, LLE 2-/5 pain with movement)  Communication   Communication: No difficulties  Cognition Arousal/Alertness: Awake/alert Behavior During Therapy: WFL for tasks assessed/performed Overall Cognitive Status: Within Functional Limits for tasks assessed                                 General Comments: Pt did have difficulty recalling some infomation about home.      General Comments      Exercises Other Exercises Other Exercises: Supine AAROM ankle pumps and heel slides. All performed 5-6 reps with VC for technique.   Assessment/Plan    PT Assessment Patient needs continued PT services  PT Problem List Decreased  strength;Decreased activity tolerance;Cardiopulmonary status limiting activity;Decreased mobility       PT Treatment Interventions DME instruction;Gait training;Functional mobility training;Therapeutic activities;Stair training;Therapeutic exercise;Balance training;Neuromuscular re-education;Patient/family education    PT Goals (Current goals can be found in the Care Plan section)  Acute Rehab PT Goals Patient Stated Goal: To get stronger again. PT Goal Formulation: With patient Time For Goal Achievement: 01/17/18 Potential to Achieve Goals: Fair Additional Goals Additional Goal #1: Pt will be able to perform bed mobility with min-mod A for increased independence with mobility.    Frequency Min 2X/week   Barriers to discharge        Co-evaluation               AM-PAC PT "6 Clicks" Daily Activity  Outcome Measure Difficulty turning over in bed (including adjusting bedclothes, sheets and blankets)?: Unable Difficulty moving from lying on back to sitting on the side of the bed? : Unable Difficulty sitting down on and standing up from a chair with arms (e.g., wheelchair, bedside commode, etc,.)?: Unable Help needed moving to and from a bed to chair (including a wheelchair)?: Total Help needed walking in hospital room?: Total Help needed climbing 3-5 steps with a railing? : Total 6 Click Score: 6    End of Session Equipment Utilized During Treatment: Oxygen Activity Tolerance: Patient limited by fatigue;Treatment limited secondary to medical complications (Comment)(Elevated HR in 120s with bed mobility.) Patient left: in bed;with call bell/phone within reach;with bed alarm set;Other (comment)(MD in room) Nurse Communication: Mobility status PT Visit Diagnosis: Muscle weakness (generalized) (M62.81);Pain Pain - Right/Left: Left Pain - part of body: Arm;Leg    Time: 8264-1583 PT Time Calculation (min) (ACUTE ONLY): 20 min   Charges:   PT Evaluation $PT Eval Low  Complexity: 1 Low        Algis Downs, SPT   Algis Downs 01/03/2018, 2:25 PM

## 2018-01-03 NOTE — Progress Notes (Addendum)
Per CCMD patient had 5.7 sec pause on tele, VSS, pt asymptomatic, pt resting in bed, daughter at bedside. Dr. Clayborn Bigness notified, Per Dr. Clayborn Bigness d/c amio and digoxin. Will continue to monitor

## 2018-01-03 NOTE — Progress Notes (Signed)
[place] at [place] NAME: Colleen Nguyen    MR#:  [id number]  DATE OF BIRTH:  [birth year]  SUBJECTIVE: Has some cough, mild shortness of breath noted to have wide-complex tachycardia with heart rate up to 136 bpm.  CHIEF COMPLAINT:   Chief Complaint  Patient presents with  . Hypotension  . Weakness   The patient has shortness of breath and cough, on oxygen by nasal cannula 3 L. REVIEW OF SYSTEMS:   ROS CONSTITUTIONAL: No fever, has generalized weakness.  EYES: No blurred or double vision.  EARS, NOSE, AND THROAT: No tinnitus or ear pain.  RESPIRATORY: Some cough, SOB. CARDIOVASCULAR: No chest pain, orthopnea, edema.  GASTROINTESTINAL: No nausea, vomiting, diarrhea or abdominal pain.  GENITOURINARY: No dysuria, hematuria.  ENDOCRINE: No polyuria, nocturia,  HEMATOLOGY: No anemia, easy bruising or bleeding SKIN: No rash or lesion. MUSCULOSKELETAL: No joint pain or arthritis.   NEUROLOGIC: No tingling, numbness, weakness.  PSYCHIATRY: No anxiety or depression.   DRUG ALLERGIES:   Allergies  Allergen Reactions  . Carisoprodol Other (See Comments)    Reaction:  Hypersomnolence   . Phenergan [Promethazine] Other (See Comments)    Reaction:  Hallucinations   . Norco [Hydrocodone-Acetaminophen] Itching and Rash  . Pravastatin Rash    VITALS:  Blood pressure 135/68, pulse 72, temperature 97.9 F (36.6 C), temperature source Oral, resp. rate 18, height 5\' 1"  (1.549 m), weight 51 kg, SpO2 96 %.  PHYSICAL EXAMINATION:  GENERAL:  84 y.o.-year-old patient lying in the bed with no acute distress.  Appears frail EYES: Pupils equal, round, reactive to light and accommodation. No scleral icterus. Extraocular muscles intact.  HEENT: Head atraumatic, normocephalic. Oropharynx and nasopharynx clear.  NECK:  Supple, no jugular venous distention. No thyroid enlargement, no tenderness.  LUNGS:  diminished air entry bilateral but no  rhonchi or wheeze.  CARDIOVASCULAR: S1, S2 normal. No murmurs, rubs, or gallops.  ABDOMEN: Soft, nontender, nondistended. Bowel sounds present. No organomegaly or mass.  EXTREMITIES: No pedal edema, cyanosis, or clubbing.  NEUROLOGIC: Cranial nerves II through XII are intact. Muscle strength 4/5 in all extremities. Sensation intact. Gait not checked.  PSYCHIATRIC: The patient is alert and oriented x 3.  SKIN: No obvious rash, lesion, or ulcer.  LABORATORY PANEL:   CBC Recent Labs  Lab 01/02/18 0340  WBC 11.9*  HGB 8.7*  HCT 27.1*  PLT 186   ------------------------------------------------------------------------------------------------------------------  Chemistries  Recent Labs  Lab 12/31/17 1112  01/02/18 0340 01/03/18 0435  NA 138   < > 137 138  K 3.0*   < > 3.4* 3.8  CL 106   < > 113* 115*  CO2 21*   < > 19* 19*  GLUCOSE 92   < > 67* 88  BUN 24*   < > 34* 30*  CREATININE 1.44*   < > 1.52* 1.23*  CALCIUM 7.7*   < > 7.5* 7.7*  MG  --    < > 2.3  --   AST 25  --   --   --   ALT 9  --   --   --   ALKPHOS 43  --   --   --   BILITOT 0.8  --   --   --    < > = values in this interval not displayed.   ------------------------------------------------------------------------------------------------------------------  Cardiac Enzymes Recent Labs  Lab 12/31/17 2156  TROPONINI 1.79*   ------------------------------------------------------------------------------------------------------------------  RADIOLOGY:  No results found.  EKG:   Orders placed or performed during the hospital encounter of 12/31/17  . ED EKG  . ED EKG  . EKG 12-Lead  . EKG 12-Lead  . EKG 12-Lead  . EKG 12-Lead  . EKG 12-Lead  . EKG 12-Lead  . EKG 12-Lead  . EKG 12-Lead  . EKG 12-Lead  . EKG 12-Lead  . EKG 12-Lead  . EKG 12-Lead  . EKG 12-Lead  . EKG 12-Lead    ASSESSMENT AND PLAN:   1. Acute respiratory failure with hypoxia due to sepsis with pneumonia. Continue oxygen by nasal  cannula, DuoNeb, Robitussin as needed. Continue Zithromax and meropenem, follow-up blood cultures: Negative so far.  Leukocytosis improved.  Lactic acidosis.  Improved.  2.  ARF due to dehydration  Improvomg with IV fluid support.  Hypokalemia, improved with potassium supplement. Hypomagnesemia.  Improved with magnesium supplement.  3. Afib with RVR due to above.  History of proximal atrial fibrillation with some PACs, PVC, seen by Dr. Nehemiah Massed, started on beta-blockers as an outpatient but because of hypotension he could not get beta-blockers. Continue amiodarone p.o. twice daily, started digoxin per Dr. Clayborn Bigness.  4.  Elevated troponin, possible due to demanding ischemia. The patient was on heparin drip.  Dr. Clayborn Bigness agreed to starting aspirin.  Generalized weakness.  PT daily.  50% of the time spent in counseling, coordination of care, discussed with patient's daughter. All the records are reviewed and case discussed with Care Management/Social Workerr. Management plans discussed with the patient, family and they are in agreement.  CODE STATUS: full  TOTAL TIME TAKING CARE OF THIS PATIENT: 33 minutes.   POSSIBLE D/C IN 2 DAYS, DEPENDING ON CLINICAL CONDITION.   Demetrios Loll M.D on 01/03/2018 at 2:32 PM  Between 7am to 6pm - Pager - [phone]  After 6pm go to [email or web address] - password [email or web address] [facility] [facility]  Office  [phone]  CC: Primary care physician; Sofie Hartigan, MD   Note: This dictation was prepared with Dragon dictation along with smaller phrase technology. Any transcriptional errors that result from this process are unintentional.

## 2018-01-04 LAB — ECHOCARDIOGRAM COMPLETE
HEIGHTINCHES: 61 in
WEIGHTICAEL: 1740.8 [oz_av]

## 2018-01-04 MED ORDER — METOPROLOL TARTRATE 25 MG PO TABS
12.5000 mg | ORAL_TABLET | Freq: Two times a day (BID) | ORAL | Status: DC
  Administered 2018-01-04 – 2018-01-07 (×7): 12.5 mg via ORAL
  Filled 2018-01-04 (×7): qty 1

## 2018-01-04 MED ORDER — CEFDINIR 300 MG PO CAPS
300.0000 mg | ORAL_CAPSULE | Freq: Every day | ORAL | Status: DC
  Administered 2018-01-04 – 2018-01-07 (×4): 300 mg via ORAL
  Filled 2018-01-04 (×4): qty 1

## 2018-01-04 NOTE — Progress Notes (Signed)
[place] at [place] NAME: Colleen Nguyen    MR#:  [id number]  DATE OF BIRTH:  January 18, 1923  SUBJECTIVE: Has some cough, mild shortness of breath noted to have wide-complex tachycardia with heart rate up to 136 bpm.  CHIEF COMPLAINT:   Chief Complaint  Patient presents with  . Hypotension  . Weakness   The patient has better shortness of breath and cough, on oxygen by nasal cannula 2 L. REVIEW OF SYSTEMS:   ROS CONSTITUTIONAL: No fever, has generalized weakness.  EYES: No blurred or double vision.  EARS, NOSE, AND THROAT: No tinnitus or ear pain.  RESPIRATORY: Some cough, SOB. CARDIOVASCULAR: No chest pain, orthopnea, edema.  GASTROINTESTINAL: No nausea, vomiting, diarrhea or abdominal pain.  GENITOURINARY: No dysuria, hematuria.  ENDOCRINE: No polyuria, nocturia,  HEMATOLOGY: No anemia, easy bruising or bleeding SKIN: No rash or lesion. MUSCULOSKELETAL: No joint pain or arthritis.   NEUROLOGIC: No tingling, numbness, weakness.  PSYCHIATRY: No anxiety or depression.   DRUG ALLERGIES:   Allergies  Allergen Reactions  . Carisoprodol Other (See Comments)    Reaction:  Hypersomnolence   . Phenergan [Promethazine] Other (See Comments)    Reaction:  Hallucinations   . Norco [Hydrocodone-Acetaminophen] Itching and Rash  . Pravastatin Rash    VITALS:  Blood pressure (!) 147/61, pulse 76, temperature 98.4 F (36.9 C), temperature source Oral, resp. rate 18, height 5\' 1"  (1.549 m), weight 51.9 kg, SpO2 95 %.  PHYSICAL EXAMINATION:  GENERAL:  84 y.o.-year-old patient lying in the bed with no acute distress.  Appears frail EYES: Pupils equal, round, reactive to light and accommodation. No scleral icterus. Extraocular muscles intact.  HEENT: Head atraumatic, normocephalic. Oropharynx and nasopharynx clear.  NECK:  Supple, no jugular venous distention. No thyroid enlargement, no tenderness.  LUNGS:  diminished air entry  bilateral but no rhonchi or wheeze.  CARDIOVASCULAR: S1, S2 normal. No murmurs, rubs, or gallops.  ABDOMEN: Soft, nontender, nondistended. Bowel sounds present. No organomegaly or mass.  EXTREMITIES: No pedal edema, cyanosis, or clubbing.  NEUROLOGIC: Cranial nerves II through XII are intact. Muscle strength 4/5 in all extremities. Sensation intact. Gait not checked.  PSYCHIATRIC: The patient is alert and oriented x 3.  SKIN: No obvious rash, lesion, or ulcer.  LABORATORY PANEL:   CBC Recent Labs  Lab 01/02/18 0340  WBC 11.9*  HGB 8.7*  HCT 27.1*  PLT 186   ------------------------------------------------------------------------------------------------------------------  Chemistries  Recent Labs  Lab 12/31/17 1112  01/02/18 0340 01/03/18 0435  NA 138   < > 137 138  K 3.0*   < > 3.4* 3.8  CL 106   < > 113* 115*  CO2 21*   < > 19* 19*  GLUCOSE 92   < > 67* 88  BUN 24*   < > 34* 30*  CREATININE 1.44*   < > 1.52* 1.23*  CALCIUM 7.7*   < > 7.5* 7.7*  MG  --    < > 2.3  --   AST 25  --   --   --   ALT 9  --   --   --   ALKPHOS 43  --   --   --   BILITOT 0.8  --   --   --    < > = values in this interval not displayed.   ------------------------------------------------------------------------------------------------------------------  Cardiac Enzymes Recent Labs  Lab 12/31/17 2156  TROPONINI 1.79*   ------------------------------------------------------------------------------------------------------------------  RADIOLOGY:  No  results found.  EKG:   Orders placed or performed during the hospital encounter of 12/31/17  . ED EKG  . ED EKG  . EKG 12-Lead  . EKG 12-Lead  . EKG 12-Lead  . EKG 12-Lead  . EKG 12-Lead  . EKG 12-Lead  . EKG 12-Lead  . EKG 12-Lead  . EKG 12-Lead  . EKG 12-Lead  . EKG 12-Lead  . EKG 12-Lead  . EKG 12-Lead  . EKG 12-Lead    ASSESSMENT AND PLAN:   1. Acute respiratory failure with hypoxia due to sepsis with pneumonia. Try to  wean off oxygen by nasal cannula, DuoNeb, Robitussin as needed. Continue Zithromax and discontinue meropenem, changed to cefdinir. Blood cultures: Negative so far.  Leukocytosis improved.  Lactic acidosis.  Improved.  2.  ARF due to dehydration  Improvomg with IV fluid support.  Hypokalemia, improved with potassium supplement. Hypomagnesemia.  Improved with magnesium supplement.  3. Afib with RVR due to above.  History of proximal atrial fibrillation with some PACs, PVC, seen by Dr. Nehemiah Massed, started on beta-blockers as an outpatient but because of hypotension he could not get beta-blockers. Amiodarone and digoxin was discontinued due to 5.7 sec pause on tele per Dr. Clayborn Bigness. Resume Lopressor.  4.  Elevated troponin, possible due to demanding ischemia. The patient was on heparin drip.  Continue aspirin.  Generalized weakness.  PT daily.  50% of the time spent in counseling, coordination of care, discussed with patient's daughter. All the records are reviewed and case discussed with Care Management/Social Workerr. Management plans discussed with the patient, her daughter and they are in agreement.  CODE STATUS: full  TOTAL TIME TAKING CARE OF THIS PATIENT: 32 minutes.   POSSIBLE D/C IN 2 DAYS, DEPENDING ON CLINICAL CONDITION.   Demetrios Loll M.D on 01/04/2018 at 4:24 PM  Between 7am to 6pm - Pager - [phone]  After 6pm go to [email or web address] - password [email or web address] [facility]  Office  [phone]  CC: Primary care physician; Sofie Hartigan, MD   Note: This dictation was prepared with Dragon dictation along with smaller phrase technology. Any transcriptional errors that result from this process are unintentional.

## 2018-01-04 NOTE — Plan of Care (Signed)
  Problem: Health Behavior/Discharge Planning: Goal: Ability to manage health-related needs will improve Outcome: Progressing Note:  Patient's IV ABX d/c'd, switched to PO version of Merrem, starting today. Will continue to monitor. Wenda Low [facility]

## 2018-01-04 NOTE — Care Management Important Message (Signed)
Copy of signed IM left with patient in room.  

## 2018-01-04 NOTE — Progress Notes (Signed)
OT Cancellation Note  Patient Details Name: Colleen Nguyen MRN: [id number] DOB: Jun 26, 1922   Cancelled Treatment:    Reason Eval/Treat Not Completed: Fatigue/lethargy limiting ability to participate. Order received, chart reviewed. Daughter in room upon arrival. Pt fatigued, reports just getting back to bed with nursing staff. On room air, O2 sats 86-88%. Called RN, placed pt back on supplemental O2 at 2L O2 per RN. Within 5 minutes, O2 sats improved to 93-95%. Pt requesting re-attempt next date 2/2 fatigue. Will re-attempt next date for OT Evaluation.   Jeni Salles, MPH, MS, OTR/L ascom [phone] 01/04/18, 3:28 PM

## 2018-01-05 LAB — CULTURE, BLOOD (ROUTINE X 2)
CULTURE: NO GROWTH
Culture: NO GROWTH
SPECIAL REQUESTS: ADEQUATE
Special Requests: ADEQUATE

## 2018-01-05 NOTE — Care Management (Signed)
Spoke with patients daughter regarding discharge planning.  PT has recommended SNF and patients daughter feels this would be the best option at this time.  Notified Randall Hiss, [place].  Still requiring 2L acute O2.

## 2018-01-05 NOTE — Progress Notes (Signed)
OT Cancellation Note  Patient Details Name: Colleen Nguyen MRN: [id number] DOB: [birth year]   Cancelled Treatment:    Reason Eval/Treat Not Completed: Other (comment). Pt working with PT upon attempt. Will re-attempt OT evaluation at later date/time as pt is available and medically appropriate.   Jeni Salles, MPH, MS, OTR/L ascom [phone] 01/05/18, 10:15 AM

## 2018-01-05 NOTE — NC FL2 (Signed)
[place] LEVEL OF CARE SCREENING TOOL     IDENTIFICATION  Patient Name: Colleen Nguyen Birthdate: 1922-11-11 Sex: female Admission Date (Current Location): 12/31/2017  [place] and [place] Number:  Colleen Nguyen [id number] [place] and Address:  [facility], [address] [place], [place] [address]      Provider Number: [id number]  Attending Physician Name and Address:  Demetrios Loll, MD  Relative Name and Phone Number:  Yvette Rack Daughter [phone]     Current Level of Care: Hospital Recommended Level of Care: [place] Prior Approval Number:    Date Approved/Denied:   PASRR Number: [id number]  Discharge Plan: Home    Current Diagnoses: Patient Active Problem List   Diagnosis Date Noted  . NSTEMI (non-ST elevated myocardial infarction) ([place]) 12/31/2017  . UTI (urinary tract infection) 02/05/2017  . Atrial fibrillation with RVR ([place]) 09/30/2015  . Bradycardia 07/24/2015  . Malnutrition of moderate degree 07/23/2015  . Iron deficiency anemia, unspecified   . Iron deficiency anemia secondary to blood loss (chronic)   . Neoplasm of digestive system   . Acute blood loss anemia 07/22/2015  . GI bleed 07/22/2015  . Weakness generalized 07/22/2015  . Chronic atrial fibrillation 07/22/2015    Orientation RESPIRATION BLADDER Height & Weight     Self, Situation, Place  O2(2L) Indwelling catheter Weight: 108 lb 9.6 oz (49.3 kg) Height:  5\' 1"  (154.9 cm)  BEHAVIORAL SYMPTOMS/MOOD NEUROLOGICAL BOWEL NUTRITION STATUS      Continent Diet(Mechanical Soft)  AMBULATORY STATUS COMMUNICATION OF NEEDS Skin   Limited Assist Verbally Normal                       Personal Care Assistance Level of Assistance  Bathing, Feeding, Dressing Bathing Assistance: Limited assistance Feeding assistance: Limited assistance Dressing Assistance: Limited assistance     Functional Limitations Info  Hearing, Speech, Sight Sight  Info: Adequate Hearing Info: Adequate Speech Info: Adequate    SPECIAL CARE FACTORS FREQUENCY  PT (By licensed PT), OT (By licensed OT)     PT Frequency: 5x a day OT Frequency: 5x a day            Contractures Contractures Info: Not present    Additional Factors Info  Code Status, Allergies, Psychotropic Code Status Info: Full Code Allergies Info: CARISOPRODOL, PHENERGAN PROMETHAZINE, NORCO HYDROCODONE-ACETAMINOPHEN, PRAVASTATIN  Psychotropic Info: sertraline (ZOLOFT) tablet 50 mg          Current Medications (01/05/2018):  This is the current hospital active medication list Current Facility-Administered Medications  Medication Dose Route Frequency Provider Last Rate Last Dose  . 0.9 %  sodium chloride infusion   Intravenous PRN Hillary Bow, MD 10 mL/hr at 01/02/18 0738 500 mL at 01/02/18 0738  . acetaminophen (TYLENOL) tablet 650 mg  650 mg Oral Q6H PRN Hillary Bow, MD       Or  . acetaminophen (TYLENOL) suppository 650 mg  650 mg Rectal Q6H PRN Hillary Bow, MD   650 mg at 12/31/17 2105  . albuterol (PROVENTIL) (2.5 MG/3ML) 0.083% nebulizer solution 2.5 mg  2.5 mg Nebulization Q2H PRN Sudini, Alveta Heimlich, MD      . ALPRAZolam Duanne Moron) tablet 0.25-0.5 mg  0.25-0.5 mg Oral BID PRN Hillary Bow, MD   0.25 mg at 01/04/18 2032  . aspirin tablet 325 mg  325 mg Oral Daily Demetrios Loll, MD      . cefdinir (OMNICEF) capsule 300 mg  300 mg Oral Daily Demetrios Loll, MD  300 mg at 01/05/18 0955  . guaiFENesin-dextromethorphan (ROBITUSSIN DM) 100-10 MG/5ML syrup 5 mL  5 mL Oral Q4H PRN Demetrios Loll, MD   5 mL at 01/03/18 0832  . heparin injection 5,000 Units  5,000 Units Subcutaneous Q8H Epifanio Lesches, MD   5,000 Units at 01/05/18 1350  . metoprolol tartrate (LOPRESSOR) tablet 12.5 mg  12.5 mg Oral BID Demetrios Loll, MD   12.5 mg at 01/05/18 0954  . nitroGLYCERIN (NITROSTAT) SL tablet 0.4 mg  0.4 mg Sublingual Q5 min PRN Hillary Bow, MD   0.4 mg at 12/31/17 1331  . ondansetron  (ZOFRAN) tablet 4 mg  4 mg Oral Q6H PRN Hillary Bow, MD       Or  . ondansetron (ZOFRAN) injection 4 mg  4 mg Intravenous Q6H PRN Hillary Bow, MD   4 mg at 01/04/18 1053  . pantoprazole (PROTONIX) EC tablet 40 mg  40 mg Oral Daily Hillary Bow, MD   40 mg at 01/05/18 0954  . phenol (CHLORASEPTIC) mouth spray 1 spray  1 spray Mouth/Throat PRN Demetrios Loll, MD   1 spray at 01/04/18 0929  . polyethylene glycol (MIRALAX / GLYCOLAX) packet 17 g  17 g Oral Daily PRN Sudini, Alveta Heimlich, MD      . sertraline (ZOLOFT) tablet 50 mg  50 mg Oral Daily Hillary Bow, MD   50 mg at 01/05/18 0954  . sodium chloride flush (NS) 0.9 % injection 3 mL  3 mL Intravenous Q12H Epifanio Lesches, MD   3 mL at 01/05/18 0955  . tiZANidine (ZANAFLEX) tablet 1 mg  1 mg Oral PRN Sudini, Alveta Heimlich, MD      . traMADol Veatrice Bourbon) tablet 50 mg  50 mg Oral Q6H PRN Hillary Bow, MD   50 mg at 01/05/18 0516     Discharge Medications: Please see discharge summary for a list of discharge medications.  Relevant Imaging Results:  Relevant Lab Results:   Additional Information SSN [id number]  Ross Ludwig, [place]

## 2018-01-05 NOTE — Plan of Care (Signed)
  Problem: Health Behavior/Discharge Planning: Goal: Ability to manage health-related needs will improve Outcome: Progressing   Problem: Pain Managment: Goal: General experience of comfort will improve Outcome: Progressing   Problem: Safety: Goal: Ability to remain free from injury will improve Outcome: Progressing   

## 2018-01-05 NOTE — Progress Notes (Signed)
[place] [facility] [place] NAME: Colleen Nguyen    MR#:  [id number]  DATE OF BIRTH:  Jul 20, 1922  SUBJECTIVE: Has some cough, mild shortness of breath noted to have wide-complex tachycardia with heart rate up to 136 bpm.  CHIEF COMPLAINT:   Chief Complaint  Patient presents with  . Hypotension  . Weakness   The patient has better shortness of breath and cough, on oxygen by nasal cannula 2 L. REVIEW OF SYSTEMS:   ROS CONSTITUTIONAL: No fever, has generalized weakness.  EYES: No blurred or double vision.  EARS, NOSE, AND THROAT: No tinnitus or ear pain.  RESPIRATORY: Some cough, SOB. CARDIOVASCULAR: No chest pain, orthopnea, edema.  GASTROINTESTINAL: No nausea, vomiting, diarrhea or abdominal pain.  GENITOURINARY: No dysuria, hematuria.  ENDOCRINE: No polyuria, nocturia,  HEMATOLOGY: No anemia, easy bruising or bleeding SKIN: No rash or lesion. MUSCULOSKELETAL: No joint pain or arthritis.   NEUROLOGIC: No tingling, numbness, weakness.  PSYCHIATRY: No anxiety or depression.   DRUG ALLERGIES:   Allergies  Allergen Reactions  . Carisoprodol Other (See Comments)    Reaction:  Hypersomnolence   . Phenergan [Promethazine] Other (See Comments)    Reaction:  Hallucinations   . Norco [Hydrocodone-Acetaminophen] Itching and Rash  . Pravastatin Rash    VITALS:  Blood pressure 128/63, pulse 68, temperature 97.9 F (36.6 C), temperature source Oral, resp. rate 18, height 5\' 1"  (1.549 m), weight 49.3 kg, SpO2 93 %.  PHYSICAL EXAMINATION:  GENERAL:  84 y.o.-year-old patient lying in the bed with no acute distress.  Appears frail EYES: Pupils equal, round, reactive to light and accommodation. No scleral icterus. Extraocular muscles intact.  HEENT: Head atraumatic, normocephalic. Oropharynx and nasopharynx clear.  NECK:  Supple, no jugular venous distention. No thyroid enlargement, no tenderness.  LUNGS:  diminished air entry bilateral  but no rhonchi or wheeze.  CARDIOVASCULAR: S1, S2 normal. No murmurs, rubs, or gallops.  ABDOMEN: Soft, nontender, nondistended. Bowel sounds present. No organomegaly or mass.  EXTREMITIES: No pedal edema, cyanosis, or clubbing.  NEUROLOGIC: Cranial nerves II through XII are intact. Muscle strength 4/5 in all extremities. Sensation intact. Gait not checked.  PSYCHIATRIC: The patient is alert and oriented x 3.  SKIN: No obvious rash, lesion, or ulcer.  LABORATORY PANEL:   CBC Recent Labs  Lab 01/02/18 0340  WBC 11.9*  HGB 8.7*  HCT 27.1*  PLT 186   ------------------------------------------------------------------------------------------------------------------  Chemistries  Recent Labs  Lab 12/31/17 1112  01/02/18 0340 01/03/18 0435  NA 138   < > 137 138  K 3.0*   < > 3.4* 3.8  CL 106   < > 113* 115*  CO2 21*   < > 19* 19*  GLUCOSE 92   < > 67* 88  BUN 24*   < > 34* 30*  CREATININE 1.44*   < > 1.52* 1.23*  CALCIUM 7.7*   < > 7.5* 7.7*  MG  --    < > 2.3  --   AST 25  --   --   --   ALT 9  --   --   --   ALKPHOS 43  --   --   --   BILITOT 0.8  --   --   --    < > = values in this interval not displayed.   ------------------------------------------------------------------------------------------------------------------  Cardiac Enzymes Recent Labs  Lab 12/31/17 2156  TROPONINI 1.79*   ------------------------------------------------------------------------------------------------------------------  RADIOLOGY:  No results  found.  EKG:   Orders placed or performed during the hospital encounter of 12/31/17  . ED EKG  . ED EKG  . EKG 12-Lead  . EKG 12-Lead  . EKG 12-Lead  . EKG 12-Lead  . EKG 12-Lead  . EKG 12-Lead  . EKG 12-Lead  . EKG 12-Lead  . EKG 12-Lead  . EKG 12-Lead  . EKG 12-Lead  . EKG 12-Lead  . EKG 12-Lead  . EKG 12-Lead    ASSESSMENT AND PLAN:   1. Acute respiratory failure with hypoxia due to sepsis with pneumonia. Try to wean off  oxygen by nasal cannula, DuoNeb, Robitussin as needed. Continue Zithromax and discontinue meropenem, changed to cefdinir. Blood cultures: Negative so far.  Leukocytosis improved.  Lactic acidosis.  Improved.  2.  ARF due to dehydration  Improved with IV fluid support.  Hypokalemia, improved with potassium supplement. Hypomagnesemia.  Improved with magnesium supplement.  3. Afib with RVR due to above.  History of proximal atrial fibrillation with some PACs, PVC, seen by Dr. Nehemiah Massed, started on beta-blockers as an outpatient but because of hypotension he could not get beta-blockers. Amiodarone and digoxin was discontinued due to 5.7 sec pause on tele per Dr. Clayborn Bigness. Resumed Lopressor.  Heart rate is better controlled.  4.  Elevated troponin, possible due to demanding ischemia. The patient was on heparin drip.  Continue aspirin.  Generalized weakness.  PT daily. Poor prognosis.  Palliative care consult. 50% of the time spent in counseling, coordination of care, discussed with patient's daughter. All the records are reviewed and case discussed with Care Management/Social Workerr. Management plans discussed with the patient, her daughter and they are in agreement.  CODE STATUS: full  TOTAL TIME TAKING CARE OF THIS PATIENT: 32 minutes.   POSSIBLE D/C IN 2 DAYS, DEPENDING ON CLINICAL CONDITION.   Demetrios Loll M.D on 01/05/2018 at 10:41 AM  Between 7am to 6pm - Pager - [phone]  After 6pm go to [email or web address] - password [email or web address] [facility]  Office  [phone]  CC: Primary care physician; Sofie Hartigan, MD   Note: This dictation was prepared with Dragon dictation along with smaller phrase technology. Any transcriptional errors that result from this process are unintentional.

## 2018-01-05 NOTE — Progress Notes (Signed)
Physical Therapy Treatment Patient Details Name: Colleen Nguyen MRN: [id number] DOB: 1922-10-01 Today's Date: 01/05/2018    History of Present Illness Pt admitted for Non-ST elevation MI on 12/31/2017 with sx on arrival to ED of hypotension, generalized weakness, and SOB. EKG showed L bundle branch block. Imaging showed cardiomegaly with mild interstitial edema and small L pleural effusion, L lower lobe pneumonia possible. On 10/5 pt presents with signs and sx of sepsis, no chest pain, hypotension, no fever, and wide complex tachycardia suggestive of A-fib. Pt's PMH includes A-fib, HTN, anemia, and axiety.    PT Comments    Patient agreeable to participation as able this date, but appears generally fatigued with limited tolerance for functional activities this date.  Requiring increased support and physical assist from therapist this date (compared to initial evaluation); unable to maintain unsupported sitting or complete OOB to chair (scoot pivot, level surface) without max assist +1-2 throughout session.  Global weakness evident; limited ability to actively assist this date. Vitals stable and WFL on 2L throughout session (SaO2 92-93%, HR 70s); significant pain noted to L UE.  RN aware; meds received per patient. May benefit from palliative care consult; discussed with CSW and RNCM.   Follow Up Recommendations  SNF     Equipment Recommendations       Recommendations for Other Services OT consult     Precautions / Restrictions Precautions Precautions: Fall Restrictions Weight Bearing Restrictions: No    Mobility  Bed Mobility Overal bed mobility: Needs Assistance Bed Mobility: Supine to Sit     Supine to sit: Max assist     General bed mobility comments: max/total assist for LE management, truncal elevation.  Limited ability to actively participate with movement transition this date  Transfers Overall transfer level: Needs assistance   Transfers: Lateral/Scoot  Transfers          Lateral/Scoot Transfers: Total assist;+2 physical assistance General transfer comment: lateral scoot over level surfaces, total assist for trunk control, anterior weight translation and lateral movement.  Unable to maintain neutral sitting position without mod/max assist from therapist; unable to tolerate any weight shift to/beyond midline in A/P plane  Ambulation/Gait             General Gait Details: unsafe/unable   Stairs             Wheelchair Mobility    Modified Rankin (Stroke Patients Only)       Balance Overall balance assessment: Needs assistance Sitting-balance support: No upper extremity supported;Feet supported Sitting balance-Leahy Scale: Poor   Postural control: Posterior lean                                  Cognition Arousal/Alertness: Awake/alert Behavior During Therapy: WFL for tasks assessed/performed;Flat affect Overall Cognitive Status: Within Functional Limits for tasks assessed                                 General Comments: increased encouragement for initial participation      Exercises Other Exercises Other Exercises: Significant L UE pain noted (reports baseline arthritis in shoulder); maintains in flexed, guarded position with very limited/no active use noted during session.  Attempted gentle passive stretching; poor tolerance.  Positioned and supported appropriately once in chair for pain relief as able.  RN aware; patient reports receiving pain meds prior to session. Other Exercises: Poor sitting  balance, trunk control in all unsupported sitting positions, mod/max assist to prevent posterior LOB.  Unable to fully achieve neutral/midline in A/P plane    General Comments        Pertinent Vitals/Pain Pain Assessment: Faces Faces Pain Scale: Hurts even more Pain Location: L UE Pain Descriptors / Indicators: Aching;Grimacing;Guarding Pain Intervention(s): Limited activity within  patient's tolerance;Monitored during session;Repositioned    Home Living                      Prior Function            PT Goals (current goals can now be found in the care plan section) Acute Rehab PT Goals Patient Stated Goal: To get stronger again. PT Goal Formulation: With patient Time For Goal Achievement: 01/17/18 Potential to Achieve Goals: Fair Progress towards PT goals: Not progressing toward goals - comment    Frequency    Min 2X/week      PT Plan Current plan remains appropriate    Co-evaluation              AM-PAC PT "6 Clicks" Daily Activity  Outcome Measure  Difficulty turning over in bed (including adjusting bedclothes, sheets and blankets)?: Unable Difficulty moving from lying on back to sitting on the side of the bed? : Unable Difficulty sitting down on and standing up from a chair with arms (e.g., wheelchair, bedside commode, etc,.)?: Unable Help needed moving to and from a bed to chair (including a wheelchair)?: Total Help needed walking in hospital room?: Total Help needed climbing 3-5 steps with a railing? : Total 6 Click Score: 6    End of Session Equipment Utilized During Treatment: Oxygen Activity Tolerance: Patient limited by fatigue;Patient limited by pain Patient left: in chair;with call bell/phone within reach;with nursing/sitter in room(CNA at bedside for ADL; to connect alarm when complete)   PT Visit Diagnosis: Muscle weakness (generalized) (M62.81);Pain;Difficulty in walking, not elsewhere classified (R26.2) Pain - Right/Left: Left Pain - part of body: Arm;Leg     Time: 0017-4944 PT Time Calculation (min) (ACUTE ONLY): 20 min  Charges:  $Therapeutic Activity: 8-22 mins                     Yasira Engelson H. Owens Shark, PT, DPT, NCS 01/05/18, 10:45 AM [phone]

## 2018-01-05 NOTE — Progress Notes (Signed)
SATURATION QUALIFICATIONS: (This note is used to comply with regulatory documentation for home oxygen)  Patient Saturations on Room Air at Rest = 88%  Patient Saturations on Room Air while Ambulating = n/a%  Patient Saturations on n/aLiters of oxygen while Ambulating = n/a%  Please briefly explain why patient needs home oxygen: patient's oxygen saturation on room air while at rest dropped to 87%. Went back up to 95% on 2L of O2.

## 2018-01-05 NOTE — Clinical Social Work Note (Signed)
Clinical Social Work Assessment  Patient Details  Name: Colleen Nguyen MRN: [id number] Date of Birth: 24-Apr-1922  Date of referral:  01/05/18               Reason for consult:  Facility Placement                Permission sought to share information with:  Facility Sport and exercise psychologist, Family Supports Permission granted to share information::  Yes, Verbal Permission Granted  Name::     Yvette Rack Daughter [phone]  [phone]   Agency::  SNF admissions  Relationship::     Contact Information:     Housing/Transportation Living arrangements for the past 2 months:  Single Family Home Source of Information:  Patient, Adult Children Patient Interpreter Needed:  None Criminal Activity/Legal Involvement Pertinent to Current Situation/Hospitalization:  No - Comment as needed Significant Relationships:  Adult Children Lives with:  Adult Children Do you feel safe going back to the place where you live?  No Need for family participation in patient care:  Yes (Comment)  Care giving concerns:  Patient and her daughter feel like she needs some short term rehab before she is able to return back home.    Social Worker assessment / plan:  Patient is a 84 year old female who is alert and oriented x3.  Patient is hard of hearing, but her daughter was at bedside and assessment completed by speaking to daughter.  [name] states patient has been at SNF in the past and was at [facility].  CSW discussed the process of looking for placement and how insurance will pay for stay.  CSW informed her that there is a chance that patient may not be approved by insurance, if she is not approved family will look at either paying privately, or going home with home health.  [name] expressed understanding and gave CSW permission to begin bed search process in [place].  [name] did not express any other questions or concerns.    Employment status:   Retired Nurse, adult PT Recommendations:  [place] / Referral to community resources:  [place]  Patient/Family's Response to care:  Patient's family is in agreement to going to SNF for short term rehab.  Patient/Family's Understanding of and Emotional Response to Diagnosis, Current Treatment, and Prognosis:  [name] is hopeful that she will not have to be in SNF for very long.  Emotional Assessment Appearance:  Appears stated age Attitude/Demeanor/Rapport:    Affect (typically observed):  Appropriate, Pleasant Orientation:  Oriented to Self, Oriented to Place, Oriented to Situation Alcohol / Substance use:  Not Applicable Psych involvement (Current and /or in the community):  No (Comment)  Discharge Needs  Concerns to be addressed:  Lack of Support, Care Coordination Readmission within the last 30 days:  No Current discharge risk:  Lack of support system Barriers to Discharge:  Ship broker, Continued Medical Work up   [facility], [place] 01/05/2018, 4:02 PM

## 2018-01-05 NOTE — Plan of Care (Signed)

## 2018-01-06 DIAGNOSIS — J189 Pneumonia, unspecified organism: Secondary | ICD-10-CM

## 2018-01-06 DIAGNOSIS — R531 Weakness: Secondary | ICD-10-CM

## 2018-01-06 DIAGNOSIS — Z515 Encounter for palliative care: Secondary | ICD-10-CM

## 2018-01-06 DIAGNOSIS — Z7189 Other specified counseling: Secondary | ICD-10-CM

## 2018-01-06 LAB — CBC
HCT: 29.9 % — ABNORMAL LOW (ref 36.0–46.0)
HEMOGLOBIN: 9.5 g/dL — AB (ref 12.0–15.0)
MCH: 30 pg (ref 26.0–34.0)
MCHC: 31.8 g/dL (ref 30.0–36.0)
MCV: 94.3 fL (ref 80.0–100.0)
PLATELETS: 199 10*3/uL (ref 150–400)
RBC: 3.17 MIL/uL — ABNORMAL LOW (ref 3.87–5.11)
RDW: 14.6 % (ref 11.5–15.5)
WBC: 7.1 10*3/uL (ref 4.0–10.5)
nRBC: 0 % (ref 0.0–0.2)

## 2018-01-06 MED ORDER — CEFDINIR 300 MG PO CAPS: 300.0000 mg | ORAL_CAPSULE | Freq: Every day | ORAL | 0 refills | Status: DC

## 2018-01-06 MED ORDER — GUAIFENESIN-DM 100-10 MG/5ML PO SYRP: 5.0000 mL | ORAL_SOLUTION | ORAL | 0 refills | Status: AC | PRN

## 2018-01-06 MED ORDER — NITROGLYCERIN 0.4 MG SL SUBL: 0.4000 mg | SUBLINGUAL_TABLET | SUBLINGUAL | 12 refills | Status: AC | PRN

## 2018-01-06 MED ORDER — ALUM & MAG HYDROXIDE-SIMETH 200-200-20 MG/5ML PO SUSP: 15.0000 mL | ORAL | 0 refills | Status: AC | PRN

## 2018-01-06 MED ORDER — ALBUTEROL SULFATE (2.5 MG/3ML) 0.083% IN NEBU: 2.5000 mg | INHALATION_SOLUTION | RESPIRATORY_TRACT | 12 refills | Status: AC | PRN

## 2018-01-06 MED ORDER — AMLODIPINE BESYLATE 5 MG PO TABS: 5.0000 mg | ORAL_TABLET | Freq: Every day | ORAL | 11 refills | Status: DC

## 2018-01-06 MED ORDER — ALUM & MAG HYDROXIDE-SIMETH 200-200-20 MG/5ML PO SUSP
15.0000 mL | ORAL | Status: DC | PRN
  Administered 2018-01-06: 15 mL via ORAL
  Filled 2018-01-06: qty 30

## 2018-01-06 MED ORDER — ALPRAZOLAM 0.5 MG PO TABS: 0.2500 mg | ORAL_TABLET | Freq: Two times a day (BID) | ORAL | 0 refills | Status: DC | PRN

## 2018-01-06 NOTE — Care Management Important Message (Signed)
Copy of signed IM left with patient in room.  

## 2018-01-06 NOTE — Consult Note (Signed)
Consultation Note Date: 01/06/2018   Patient Name: Colleen Nguyen  DOB: 1922/11/01  MRN: [id number]  Age / Sex: 84 y.o., female  PCP: Sofie Hartigan, MD Referring Physician: Demetrios Loll, MD  Reason for Consultation: Establishing goals of care  HPI/Patient Profile: 84 y.o. female  with past medical history of hypertension, anxiety, anemia, afib admitted on 12/31/2017 with low blood pressure and weakness. Admitted for acute respiratory failure with hypoxia secondary to sepsis and pneumonia. Also with ARF, dehydration, hypokalemia, and hypomagnesemia which has improved throughout hospitalization. Generalized weakness. Physical therapy recommending SNF for rehab. Palliative medicine consultation for goals of care.   Clinical Assessment and Goals of Care:  I have reviewed medical records, discussed with care team, and met with patient and daughter [name]) at bedside. Son in law also present. Introduced palliative medicine. Patient is awake and alert, but tired this morning.   We discussed a brief life review of the patient. Family describes [name] as a "busy." Throughout her life, always helping others. Prior to hospitalization, living home with [name]. Ambulating with walker and with great appetite.   Discussed course of hospital diagnoses and interventions. Clinical improvement but will need to continue oxygen on discharge due to de-saturation to high 80's yesterday.    I attempted to elicit values and goals of care important to the patient and family. [name] shares that her mother is very weak and can barely stand from bed to chair. We discussed plan for discharge to SNF. Patient understands she needs to go to a SNF for rehab but hopes to return home after rehab. [name] feels her mother will be motivated to work with therapy.   Advanced directives, concepts specific to code status, and artifical feeding and  hydration were discussed. [name] was given AD packet when her mother was admitted. [name] shares that she has not looked back through this documentation. "Don't want to think about it." I encouraged [name] to further consider completing advanced directives with her mother. Explained importance of documenting HCPOA as well as discussing wishes regarding heroic interventions (resuscitation, life support, feeding tube). Her mother has never been asked these questions before and was "overwhelmed" when she was asked upon admission.   Questions and concerns were addressed. PMT contact information given.    SUMMARY OF RECOMMENDATIONS    FULL code/FULL scope treatment.   Patient does NOT have a documented living will. Discussed AD packet. Encouraged daughter to consider completing AD documentation with her mother. Discussed importance of discussing patient wishes in regards to heroic interventions.   Clinical improvement. Should discharge soon to SNF for rehab with goal to return home.   May benefit from outpatient palliative referral for continued discussions regarding advanced directives.   Code Status/Advance Care Planning:  Full code  Symptom Management:   Per attending  Palliative Prophylaxis:   Aspiration, Delirium Protocol, Oral Care and Turn Reposition  Additional Recommendations (Limitations, Scope, Preferences):  Full Scope Treatment  Psycho-social/Spiritual:   Desire for further Chaplaincy support:yes  Additional Recommendations: Caregiving  Support/Resources  Prognosis:   Unable to determine  Discharge Planning: [place] for rehab with Palliative care service follow-up      Primary Diagnoses: Present on Admission: . NSTEMI (non-ST elevated myocardial infarction) ([place])   I have reviewed the medical record, interviewed the patient and family, and examined the patient. The following aspects are pertinent.  Past Medical History:  Diagnosis Date  .  Anemia   . Anxiety   . Atrial fibrillation ([place])   . Hypertension    Social History   Socioeconomic History  . Marital status: Widowed    Spouse name: Not on file  . Number of children: Not on file  . Years of education: Not on file  . Highest education level: Not on file  Occupational History  . Not on file  Social Needs  . Financial resource strain: Not on file  . Food insecurity:    Worry: Not on file    Inability: Not on file  . Transportation needs:    Medical: Not on file    Non-medical: Not on file  Tobacco Use  . Smoking status: Never Smoker  . Smokeless tobacco: Never Used  Substance and Sexual Activity  . Alcohol use: No  . Drug use: No  . Sexual activity: Not on file  Lifestyle  . Physical activity:    Days per week: Not on file    Minutes per session: Not on file  . Stress: Not on file  Relationships  . Social connections:    Talks on phone: Not on file    Gets together: Not on file    Attends religious service: Not on file    Active member of club or organization: Not on file    Attends meetings of clubs or organizations: Not on file    Relationship status: Not on file  Other Topics Concern  . Not on file  Social History Narrative  . Not on file   Family History  Problem Relation Age of Onset  . Leukemia Mother   . Stroke Father   . CAD Brother    Scheduled Meds: . aspirin  325 mg Oral Daily  . cefdinir  300 mg Oral Daily  . heparin injection (subcutaneous)  5,000 Units Subcutaneous Q8H  . metoprolol tartrate  12.5 mg Oral BID  . pantoprazole  40 mg Oral Daily  . sertraline  50 mg Oral Daily  . sodium chloride flush  3 mL Intravenous Q12H   Continuous Infusions: . sodium chloride 500 mL (01/02/18 0738)   PRN Meds:.sodium chloride, acetaminophen **OR** acetaminophen, albuterol, ALPRAZolam, guaiFENesin-dextromethorphan, nitroGLYCERIN, ondansetron **OR** ondansetron (ZOFRAN) IV, phenol, polyethylene glycol, tiZANidine, traMADol Medications  Prior to Admission:  Prior to Admission medications   Medication Sig Start Date End Date Taking? Authorizing Provider  ferrous sulfate 325 (65 FE) MG tablet Take 325 mg by mouth daily.   Yes [provider]  ibuprofen (ADVIL,MOTRIN) 200 MG tablet Take 800 mg by mouth at bedtime.   Yes [provider]  lisinopril-hydrochlorothiazide (PRINZIDE,ZESTORETIC) 20-12.5 MG tablet Take 1 tablet by mouth daily.   Yes [provider]  metoprolol tartrate (LOPRESSOR) 25 MG tablet Take 25 mg by mouth daily.   Yes [provider]  omeprazole (PRILOSEC) 20 MG capsule Take 20 mg by mouth daily.   Yes [provider]  sertraline (ZOLOFT) 50 MG tablet Take 50 mg by mouth daily.   Yes [provider]  tiZANidine (ZANAFLEX) 2 MG tablet Take 1 mg by mouth as  needed for muscle spasms.   Yes [provider]  albuterol (PROVENTIL) (2.5 MG/3ML) 0.083% nebulizer solution Take 3 mLs (2.5 mg total) by nebulization every 4 (four) hours as needed for wheezing or shortness of breath. 01/06/18   Demetrios Loll, MD  ALPRAZolam Duanne Moron) 0.5 MG tablet Take 0.5-1 tablets (0.25-0.5 mg total) by mouth 2 (two) times daily as needed for anxiety or sleep. 01/06/18   Demetrios Loll, MD  amLODipine (NORVASC) 5 MG tablet Take 1 tablet (5 mg total) by mouth daily. 01/06/18 01/06/19  Demetrios Loll, MD  cefdinir (OMNICEF) 300 MG capsule Take 1 capsule (300 mg total) by mouth daily. 01/06/18   Demetrios Loll, MD  guaiFENesin-dextromethorphan [facility] DM) 100-10 MG/5ML syrup Take 5 mLs by mouth every 4 (four) hours as needed for cough. 01/06/18   Demetrios Loll, MD  nitroGLYCERIN (NITROSTAT) 0.4 MG SL tablet Place 1 tablet (0.4 mg total) under the tongue every 5 (five) minutes as needed for chest pain. 01/06/18   Demetrios Loll, MD   Allergies  Allergen Reactions  . Carisoprodol Other (See Comments)    Reaction:  Hypersomnolence   . Phenergan [Promethazine] Other (See Comments)    Reaction:  Hallucinations     . Norco [Hydrocodone-Acetaminophen] Itching and Rash  . Pravastatin Rash   Review of Systems  Constitutional: Positive for activity change and fatigue.  Neurological: Positive for weakness.   Physical Exam  Constitutional: She is oriented to person, place, and time. She is cooperative. She appears ill.  HENT:  Head: Normocephalic and atraumatic.  Pulmonary/Chest: No accessory muscle usage. No tachypnea. No respiratory distress.  [place] 2L  Abdominal: Normal appearance.  Neurological: She is alert and oriented to person, place, and time.  Skin: Skin is warm and dry.  Psychiatric: She has a normal mood and affect. Her speech is normal and behavior is normal. Cognition and memory are normal.  Nursing note and vitals reviewed.   Vital Signs: BP (!) 152/65 (BP Location: Left Arm)   Pulse 64   Temp 98.6 F (37 C) (Oral)   Resp 20   Ht 5' 1"  (1.549 m)   Wt 50.6 kg   SpO2 99%   BMI 21.09 kg/m  Pain Scale: 0-10   Pain Score: Asleep   SpO2: SpO2: 99 % O2 Device:SpO2: 99 % O2 Flow Rate: .O2 Flow Rate (L/min): 2 L/min  IO: Intake/output summary:   Intake/Output Summary (Last 24 hours) at 01/06/2018 0959 Last data filed at 01/06/2018 0020 Gross per 24 hour  Intake 240 ml  Output 200 ml  Net 40 ml    LBM: Last BM Date: 01/05/18 Baseline Weight: Weight: 46.3 kg Most recent weight: Weight: 50.6 kg     Palliative Assessment/Data: PPS 50%   Flowsheet Rows     Most Recent Value  Intake Tab  Referral Department  Hospitalist  Unit at Time of Referral  Cardiac/Telemetry Unit  Palliative Care Primary Diagnosis  Sepsis/Infectious Disease  Palliative Care Type  New Palliative care  Reason for referral  Clarify Goals of Care  Date first seen by Palliative Care  01/06/18  Clinical Assessment  Palliative Performance Scale Score  50%  Psychosocial & Spiritual Assessment  Palliative Care Outcomes  Patient/Family meeting held?  Yes  Who was at the meeting?  patient, daughter, SIL   Palliative Care Outcomes  Clarified goals of care, Provided psychosocial or spiritual support, ACP counseling assistance, Linked to palliative care logitudinal support      Time In: 0915 Time Out: 7680  Time Total: 28mn Greater than 50%  of this time was spent counseling and coordinating care related to the above assessment and plan.  Signed by:  MIhor Dow FNP-C Palliative Medicine Team  Phone: [phone]: [phone]  Please contact Palliative Medicine Team phone at [phone] questions and concerns.  For individual provider: See AShea Evans

## 2018-01-06 NOTE — Clinical Social Work Note (Signed)
CSW received phone call from [employer], they have approved patient to go to [facility].  CSW contacted [facility] and they said they are not able to accept patient today, but can accept patient in the morning.  CSW updated patient, her daughter who was at bedside, physician, and bedside nurse.  CSW to continue to facilitate discharge planning.  Jones Broom. [place], MSW, [place]  01/06/2018 6:27 PM

## 2018-01-06 NOTE — Clinical Social Work Note (Addendum)
CSW received phone call from [employer] company saying they have not received patient's clinical information.  CSW faxed requested clinicals yesterday to [phone] around 10:00am.  CSW refaxed clinical information today to [phone], awaiting approval for SNF from insurance company.  Patient would like to go to [facility] [place].  CSW to continue to follow patient's progress throughout discharge planning.  CSW still waiting on insurance authorization on patient to go to [facility].  Palliative also saw patient and are recommending Palliative follow at SNF.  CSW made referral to [name] at [facility] [place] and palliative.  Jones Broom. Maebell Lyvers, MSW, [place]  01/06/2018 10:38 AM

## 2018-01-06 NOTE — Progress Notes (Addendum)
New referral for outpatient Palliative to follow at [facility] received from CSW Eric Anterhaus. Plan is for discharge tomorrow. Patient information faxed to referral. °[name] Robertson RN, BSN, CHPN °[facility], hospital Liaison °[phone] °

## 2018-01-06 NOTE — Discharge Summary (Signed)
[name]: Colleen Nguyen    MR#:  [id number]  DATE OF BIRTH:  [birth year]  DATE OF ADMISSION:  12/31/2017   ADMITTING PHYSICIAN: Hillary Bow, MD  DATE OF DISCHARGE:  01/06/2018  PRIMARY CARE PHYSICIAN: Sofie Hartigan, MD   ADMISSION DIAGNOSIS:  NSTEMI (non-ST elevated myocardial infarction) ([place]) [I21.4] DISCHARGE DIAGNOSIS:  Active Problems:   Weakness   NSTEMI (non-ST elevated myocardial infarction) [facility])   Palliative care by specialist   Goals of care, counseling/discussion   Pneumonia due to infectious organism  SECONDARY DIAGNOSIS:   Past Medical History:  Diagnosis Date  . Anemia   . Anxiety   . Atrial fibrillation ([place])   . Hypertension    HOSPITAL COURSE:  1. Acute respiratory failure with hypoxia due to sepsis with pneumonia. She has been on O2 [place], unable to wean off oxygen by nasal cannula, needs 2 L [place]. DuoNeb, Robitussin as needed. She was treated with Zithromax and discontinue meropenem, changed to cefdinir. Blood cultures: Negative so far.  Leukocytosis improved.  Lactic acidosis.  Improved.  2.  ARF due to dehydration  Improved with IV fluid support.  Hypokalemia, improved with potassium supplement. Hypomagnesemia.  Improved with magnesium supplement.  3. Afib with RVR due to above.  History of proximal atrial fibrillation with some PACs, PVC, seen by Dr. Nehemiah Massed, started on beta-blockers as an outpatient but because of hypotension he could not get beta-blockers. Amiodarone and digoxin was discontinued due to 5.7 sec pauseon tele per Dr. Clayborn Bigness. Resumed Lopressor.  Heart rate is better controlled.  4.  Elevated troponin, possible due to demanding ischemia. The patient was on heparin drip.  Continue aspirin.  Generalized weakness.  PT. Poor prognosis.  Palliative care follow up in SNF.  DISCHARGE CONDITIONS:  Stable, discharge to SNF today. CONSULTS OBTAINED:   DRUG  ALLERGIES:   Allergies  Allergen Reactions  . Carisoprodol Other (See Comments)    Reaction:  Hypersomnolence   . Phenergan [Promethazine] Other (See Comments)    Reaction:  Hallucinations   . Norco [Hydrocodone-Acetaminophen] Itching and Rash  . Pravastatin Rash   DISCHARGE MEDICATIONS:   Allergies as of 01/06/2018      Reactions   Carisoprodol Other (See Comments)   Reaction:  Hypersomnolence    Phenergan [promethazine] Other (See Comments)   Reaction:  Hallucinations    Norco [hydrocodone-acetaminophen] Itching, Rash   Pravastatin Rash      Medication List    STOP taking these medications   ibuprofen 200 MG tablet Commonly known as:  ADVIL,MOTRIN   lisinopril-hydrochlorothiazide 20-12.5 MG tablet Commonly known as:  PRINZIDE,ZESTORETIC     TAKE these medications   albuterol (2.5 MG/3ML) 0.083% nebulizer solution Commonly known as:  PROVENTIL Take 3 mLs (2.5 mg total) by nebulization every 4 (four) hours as needed for wheezing or shortness of breath.   ALPRAZolam 0.5 MG tablet Commonly known as:  XANAX Take 0.5-1 tablets (0.25-0.5 mg total) by mouth 2 (two) times daily as needed for anxiety or sleep.   alum & mag hydroxide-simeth 200-200-20 MG/5ML suspension Commonly known as:  MAALOX/MYLANTA Take 15 mLs by mouth every 4 (four) hours as needed for indigestion or heartburn.   amLODipine 5 MG tablet Commonly known as:  NORVASC Take 1 tablet (5 mg total) by mouth daily.   cefdinir 300 MG capsule Commonly known as:  OMNICEF Take 1 capsule (300 mg total) by mouth daily.   ferrous sulfate 325 (65  FE) MG tablet Take 325 mg by mouth daily.   guaiFENesin-dextromethorphan 100-10 MG/5ML syrup Commonly known as:  ROBITUSSIN DM Take 5 mLs by mouth every 4 (four) hours as needed for cough.   metoprolol tartrate 25 MG tablet Commonly known as:  LOPRESSOR Take 25 mg by mouth daily.   nitroGLYCERIN 0.4 MG SL tablet Commonly known as:  NITROSTAT Place 1 tablet (0.4  mg total) under the tongue every 5 (five) minutes as needed for chest pain.   omeprazole 20 MG capsule Commonly known as:  PRILOSEC Take 20 mg by mouth daily.   sertraline 50 MG tablet Commonly known as:  ZOLOFT Take 50 mg by mouth daily.   tiZANidine 2 MG tablet Commonly known as:  ZANAFLEX Take 1 mg by mouth as needed for muscle spasms.        DISCHARGE INSTRUCTIONS:  See AVS.  If you experience worsening of your admission symptoms, develop shortness of breath, life threatening emergency, suicidal or homicidal thoughts you must seek medical attention immediately by calling 911 or calling your MD immediately  if symptoms less severe.  You Must read complete instructions/literature along with all the possible adverse reactions/side effects for all the Medicines you take and that have been prescribed to you. Take any new Medicines after you have completely understood and accpet all the possible adverse reactions/side effects.   Please note  You were cared for by a hospitalist during your hospital stay. If you have any questions about your discharge medications or the care you received while you were in the hospital after you are discharged, you can call the unit and asked to speak with the hospitalist on call if the hospitalist that took care of you is not available. Once you are discharged, your primary care physician will handle any further medical issues. Please note that NO REFILLS for any discharge medications will be authorized once you are discharged, as it is imperative that you return to your primary care physician (or establish a relationship with a primary care physician if you do not have one) for your aftercare needs so that they can reassess your need for medications and monitor your lab values.    On the day of Discharge:  VITAL SIGNS:  Blood pressure (!) 149/60, pulse 66, temperature 98.1 F (36.7 C), temperature source Oral, resp. rate 20, height 5\' 1"  (1.549 m),  weight 50.6 kg, SpO2 100 %. PHYSICAL EXAMINATION:  GENERAL:  84 y.o.-year-old patient lying in the bed with no acute distress.  EYES: Pupils equal, round, reactive to light and accommodation. No scleral icterus. Extraocular muscles intact.  HEENT: Head atraumatic, normocephalic. Oropharynx and nasopharynx clear.  NECK:  Supple, no jugular venous distention. No thyroid enlargement, no tenderness.  LUNGS: Normal breath sounds bilaterally, no wheezing, rales,rhonchi or crepitation. No use of accessory muscles of respiration.  CARDIOVASCULAR: S1, S2 normal. No murmurs, rubs, or gallops.  ABDOMEN: Soft, non-tender, non-distended. Bowel sounds present. No organomegaly or mass.  EXTREMITIES: No pedal edema, cyanosis, or clubbing.  NEUROLOGIC: Cranial nerves II through XII are intact. Muscle strength 4/5 in all extremities. Sensation intact. Gait not checked.  PSYCHIATRIC: The patient is alert and oriented x 3.  SKIN: No obvious rash, lesion, or ulcer.  DATA REVIEW:   CBC Recent Labs  Lab 01/06/18 0505  WBC 7.1  HGB 9.5*  HCT 29.9*  PLT 199    Chemistries  Recent Labs  Lab 12/31/17 1112  01/02/18 0340 01/03/18 0435  NA 138   < >  137 138  K 3.0*   < > 3.4* 3.8  CL 106   < > 113* 115*  CO2 21*   < > 19* 19*  GLUCOSE 92   < > 67* 88  BUN 24*   < > 34* 30*  CREATININE 1.44*   < > 1.52* 1.23*  CALCIUM 7.7*   < > 7.5* 7.7*  MG  --    < > 2.3  --   AST 25  --   --   --   ALT 9  --   --   --   ALKPHOS 43  --   --   --   BILITOT 0.8  --   --   --    < > = values in this interval not displayed.     Microbiology Results  Results for orders placed or performed during the hospital encounter of 12/31/17  Culture, blood (Routine X 2) w Reflex to ID Panel     Status: None   Collection Time: 12/31/17  9:56 PM  Result Value Ref Range Status   Specimen Description BLOOD RIGHT ASSIST CONTROL  Final   Special Requests   Final    BOTTLES DRAWN AEROBIC AND ANAEROBIC Blood Culture adequate  volume   Culture   Final    NO GROWTH 5 DAYS Performed at [facility], [address] [place], [place] [address]    Report Status 01/05/2018 FINAL  Final  Culture, blood (Routine X 2) w Reflex to ID Panel     Status: None   Collection Time: 12/31/17  9:56 PM  Result Value Ref Range Status   Specimen Description BLOOD RIGHT ARM  Final   Special Requests   Final    BOTTLES DRAWN AEROBIC AND ANAEROBIC Blood Culture adequate volume   Culture   Final    NO GROWTH 5 DAYS Performed at [facility], [address] [place], [place] [address]    Report Status 01/05/2018 FINAL  Final  MRSA PCR Screening     Status: None   Collection Time: 01/01/18 12:36 PM  Result Value Ref Range Status   MRSA by PCR NEGATIVE NEGATIVE Final    Comment:        The GeneXpert MRSA Assay (FDA approved for NASAL specimens only), is one component of a comprehensive MRSA colonization surveillance program. It is not intended to diagnose MRSA infection nor to guide or monitor treatment for MRSA infections. Performed at [facility], [address], [place], [place] [address]     RADIOLOGY:  No results found.   Management plans discussed with the patient, her daughter and they are in agreement.  CODE STATUS: Full Code   TOTAL TIME TAKING CARE OF THIS PATIENT: 35  minutes.    Demetrios Loll M.D on 01/06/2018 at 2:01 PM  Between 7am to 6pm - Pager - [phone]  After 6pm go to [email or web address] - Proofreader  [facility]  Office  [phone]  CC: Primary care physician; Sofie Hartigan, MD   Note: This dictation was prepared with Dragon dictation along with smaller phrase technology. Any transcriptional errors that result from this process are unintentional.

## 2018-01-06 NOTE — Discharge Instructions (Signed)
Aspiration and fall precaution. O2 [place] 2 L. Palliative care follow up in SNF.

## 2018-01-06 NOTE — Clinical Social Work Note (Signed)
CSW received consult that patient and family would like SNF now for short term rehab.  CSW spoke to family and explained to them, the process, and told them insurance will have to approve her to go to SNF.  CSW was given permission to begin bed search in [facility], and CSW also sent required clinical information to patient's insurance company to begin insurance approval.  Jones Broom. [place], MSW, [place]

## 2018-01-06 NOTE — Progress Notes (Signed)
Patient complaining of "discomfort" on left lower side. Patient states it is not chest pain just discomfort, pt feels like its "gas". Patient readjusted in the chair and PRN medication ordered. Will administer and continue to monitor patient.

## 2018-01-06 NOTE — Progress Notes (Signed)
Rounded on patient. Patient stating the pain has gone away. Feeling much better. Will continue to monitor patient.

## 2018-01-06 NOTE — Progress Notes (Signed)
[facility] [place] NAME: Colleen Nguyen    MR#:  [id number]  DATE OF BIRTH:  10/29/1922  SUBJECTIVE: Has some cough, mild shortness of breath noted to have wide-complex tachycardia with heart rate up to 136 bpm.  CHIEF COMPLAINT:   Chief Complaint  Patient presents with  . Hypotension  . Weakness   The patient has better shortness of breath and coguh, on oxygen by nasal cannula 2 L. REVIEW OF SYSTEMS:   ROS CONSTITUTIONAL: No fever, has generalized weakness.  EYES: No blurred or double vision.  EARS, NOSE, AND THROAT: No tinnitus or ear pain.  RESPIRATORY: Some cough, SOB. CARDIOVASCULAR: No chest pain, orthopnea, edema.  GASTROINTESTINAL: No nausea, vomiting, diarrhea or abdominal pain.  GENITOURINARY: No dysuria, hematuria.  ENDOCRINE: No polyuria, nocturia,  HEMATOLOGY: No anemia, easy bruising or bleeding SKIN: No rash or lesion. MUSCULOSKELETAL: No joint pain or arthritis.   NEUROLOGIC: No tingling, numbness, weakness.  PSYCHIATRY: No anxiety or depression.   DRUG ALLERGIES:   Allergies  Allergen Reactions  . Carisoprodol Other (See Comments)    Reaction:  Hypersomnolence   . Phenergan [Promethazine] Other (See Comments)    Reaction:  Hallucinations   . Norco [Hydrocodone-Acetaminophen] Itching and Rash  . Pravastatin Rash    VITALS:  Blood pressure (!) 141/58, pulse 63, temperature 98.2 F (36.8 C), temperature source Oral, resp. rate 18, height 5\' 1"  (1.549 m), weight 50.6 kg, SpO2 98 %.  PHYSICAL EXAMINATION:  GENERAL:  84 y.o.-year-old patient lying in the bed with no acute distress.  Appears frail EYES: Pupils equal, round, reactive to light and accommodation. No scleral icterus. Extraocular muscles intact.  HEENT: Head atraumatic, normocephalic. Oropharynx and nasopharynx clear.  NECK:  Supple, no jugular venous distention. No thyroid enlargement, no tenderness.  LUNGS:  diminished air entry  bilateral but no rhonchi or wheeze.  CARDIOVASCULAR: S1, S2 normal. No murmurs, rubs, or gallops.  ABDOMEN: Soft, nontender, nondistended. Bowel sounds present. No organomegaly or mass.  EXTREMITIES: No pedal edema, cyanosis, or clubbing.  NEUROLOGIC: Cranial nerves II through XII are intact. Muscle strength 4/5 in all extremities. Sensation intact. Gait not checked.  PSYCHIATRIC: The patient is alert and oriented x 3.  SKIN: No obvious rash, lesion, or ulcer.  LABORATORY PANEL:   CBC Recent Labs  Lab 01/06/18 0505  WBC 7.1  HGB 9.5*  HCT 29.9*  PLT 199   ------------------------------------------------------------------------------------------------------------------  Chemistries  Recent Labs  Lab 12/31/17 1112  01/02/18 0340 01/03/18 0435  NA 138   < > 137 138  K 3.0*   < > 3.4* 3.8  CL 106   < > 113* 115*  CO2 21*   < > 19* 19*  GLUCOSE 92   < > 67* 88  BUN 24*   < > 34* 30*  CREATININE 1.44*   < > 1.52* 1.23*  CALCIUM 7.7*   < > 7.5* 7.7*  MG  --    < > 2.3  --   AST 25  --   --   --   ALT 9  --   --   --   ALKPHOS 43  --   --   --   BILITOT 0.8  --   --   --    < > = values in this interval not displayed.   ------------------------------------------------------------------------------------------------------------------  Cardiac Enzymes Recent Labs  Lab 12/31/17 2156  TROPONINI 1.79*   ------------------------------------------------------------------------------------------------------------------  RADIOLOGY:  No  results found.  EKG:   Orders placed or performed during the hospital encounter of 12/31/17  . ED EKG  . ED EKG  . EKG 12-Lead  . EKG 12-Lead  . EKG 12-Lead  . EKG 12-Lead  . EKG 12-Lead  . EKG 12-Lead  . EKG 12-Lead  . EKG 12-Lead  . EKG 12-Lead  . EKG 12-Lead  . EKG 12-Lead  . EKG 12-Lead  . EKG 12-Lead  . EKG 12-Lead    ASSESSMENT AND PLAN:   1. Acute respiratory failure with hypoxia due to sepsis with pneumonia. She has  been on O2 [place], unable to wean off oxygen by nasal cannula, needs 2 L [place]. DuoNeb, Robitussin as needed. She was treated with Zithromax and discontinue meropenem, changed to cefdinir. Blood cultures: Negative so far. Leukocytosis improved.  Lactic acidosis. Improved.  2. ARF due to dehydration  Improvedwith IV fluid support.  Hypokalemia, improved with potassium supplement. Hypomagnesemia. Improved with magnesium supplement.  3. Afib with RVR due to above. History of proximal atrial fibrillation with some PACs, PVC, seen by Dr. Nehemiah Massed, started on beta-blockers as an outpatient but because of hypotension he could not get beta-blockers. Amiodarone and digoxin was discontinued due to 5.7 sec pauseon tele per Dr. Clayborn Bigness. ResumedLopressor.Heart rate is better controlled.  4. Elevated troponin, possible due to demanding ischemia. The patient was on heparin drip. Continue aspirin.  Generalized weakness. PT: SNF. Poor prognosis. Palliative care follow up in SNF. 50% of the time spent in counseling, coordination of care, discussed with patient's daughter. All the records are reviewed and case discussed with Care Management/Social Workerr. Management plans discussed with the patient, her daughter and they are in agreement.  CODE STATUS: full  TOTAL TIME TAKING CARE OF THIS PATIENT: 32 minutes.   POSSIBLE D/C IN 1 DAYS, DEPENDING ON CLINICAL CONDITION.   Demetrios Loll M.D on 01/06/2018 at 6:44 PM  Between 7am to 6pm - Pager - [phone]  After 6pm go to [email or web address] - password [email or web address] [facility]  Office  [phone]  CC: Primary care physician; Sofie Hartigan, MD   Note: This dictation was prepared with Dragon dictation along with smaller phrase technology. Any transcriptional errors that result from this process are unintentional.

## 2018-01-06 NOTE — Care Management (Signed)
Pending authorization for placement at [facility].

## 2018-01-07 ENCOUNTER — Encounter: Payer: Self-pay | Admitting: Internal Medicine

## 2018-01-07 DIAGNOSIS — A419 Sepsis, unspecified organism: Secondary | ICD-10-CM | POA: Diagnosis not present

## 2018-01-07 DIAGNOSIS — R7989 Other specified abnormal findings of blood chemistry: Secondary | ICD-10-CM | POA: Diagnosis not present

## 2018-01-07 DIAGNOSIS — Z9911 Dependence on respirator [ventilator] status: Secondary | ICD-10-CM | POA: Diagnosis not present

## 2018-01-07 DIAGNOSIS — J189 Pneumonia, unspecified organism: Secondary | ICD-10-CM | POA: Diagnosis not present

## 2018-01-07 DIAGNOSIS — N390 Urinary tract infection, site not specified: Secondary | ICD-10-CM | POA: Diagnosis not present

## 2018-01-07 DIAGNOSIS — F418 Other specified anxiety disorders: Secondary | ICD-10-CM | POA: Diagnosis not present

## 2018-01-07 DIAGNOSIS — D649 Anemia, unspecified: Secondary | ICD-10-CM | POA: Diagnosis not present

## 2018-01-07 DIAGNOSIS — I5030 Unspecified diastolic (congestive) heart failure: Secondary | ICD-10-CM | POA: Diagnosis not present

## 2018-01-07 DIAGNOSIS — I214 Non-ST elevation (NSTEMI) myocardial infarction: Secondary | ICD-10-CM | POA: Diagnosis not present

## 2018-01-07 DIAGNOSIS — I10 Essential (primary) hypertension: Secondary | ICD-10-CM | POA: Diagnosis not present

## 2018-01-07 DIAGNOSIS — J129 Viral pneumonia, unspecified: Secondary | ICD-10-CM | POA: Diagnosis not present

## 2018-01-07 DIAGNOSIS — R05 Cough: Secondary | ICD-10-CM | POA: Diagnosis not present

## 2018-01-07 DIAGNOSIS — D508 Other iron deficiency anemias: Secondary | ICD-10-CM | POA: Diagnosis not present

## 2018-01-07 DIAGNOSIS — M6281 Muscle weakness (generalized): Secondary | ICD-10-CM | POA: Diagnosis not present

## 2018-01-07 DIAGNOSIS — J9601 Acute respiratory failure with hypoxia: Secondary | ICD-10-CM | POA: Diagnosis not present

## 2018-01-07 DIAGNOSIS — Z515 Encounter for palliative care: Secondary | ICD-10-CM | POA: Diagnosis not present

## 2018-01-07 DIAGNOSIS — M62838 Other muscle spasm: Secondary | ICD-10-CM | POA: Diagnosis not present

## 2018-01-07 DIAGNOSIS — F064 Anxiety disorder due to known physiological condition: Secondary | ICD-10-CM | POA: Diagnosis not present

## 2018-01-07 DIAGNOSIS — I4891 Unspecified atrial fibrillation: Secondary | ICD-10-CM | POA: Diagnosis not present

## 2018-01-07 DIAGNOSIS — R0602 Shortness of breath: Secondary | ICD-10-CM | POA: Diagnosis not present

## 2018-01-07 NOTE — Discharge Summary (Signed)
[facility] [place] NAME: Colleen Nguyen    MR#:  [id number]  DATE OF BIRTH:  June 10, 1922  DATE OF ADMISSION:  12/31/2017 ADMITTING PHYSICIAN: Hillary Bow, MD  DATE OF DISCHARGE: January 07, 2018 PRIMARY CARE PHYSICIAN: Sofie Hartigan, MD    ADMISSION DIAGNOSIS:  NSTEMI (non-ST elevated myocardial infarction) ([place]) [I21.4]  DISCHARGE DIAGNOSIS:  Active Problems:   Weakness Elevated troponin  palliative care by specialist   Goals of care, counseling/discussion   Pneumonia due to infectious organism   SECONDARY DIAGNOSIS:   Past Medical History:  Diagnosis Date  . Anemia   . Anxiety   . Atrial fibrillation ([place])   . Diastolic CHF ([place])   . Hypertension     HOSPITAL COURSE:  84 year old female with history of paroxsmal atrial fibrillation and chronic diastolic heart failure who presented with weakness and shortness of breath.  1.  Acute hypoxic respiratory failure due to sepsis and pneumonia: Patient was on broad-spectrum antibiotics and has been changed to oral antibiotics upon discharge.  She will need oxygen upon discharge.  2.  Acute kidney injury due to dehydration: This is improved with IV fluids  3.  Electrolyte abnormalities: These were corrected  4.  Atrial fibrillation with RVR with history of PAF: Patient's heart rate is better controlled on Lopressor.  5.  Elevated troponin due to demand ischemia.  Patient was ruled out for ACS.  Patient was evaluated by cardiology  Patient has overall poor prognosis.  She was evaluated palliative care.  She will continue have palliative care outpatient follow-up.   DISCHARGE CONDITIONS AND DIET:   Guarded condition  Soft diet with aspiration precautions  CONSULTS OBTAINED:    DRUG ALLERGIES:   Allergies  Allergen Reactions  . Carisoprodol Other (See Comments)    Reaction:  Hypersomnolence   . Phenergan [Promethazine] Other (See Comments)    Reaction:   Hallucinations   . Norco [Hydrocodone-Acetaminophen] Itching and Rash  . Pravastatin Rash    DISCHARGE MEDICATIONS:   Allergies as of 01/07/2018      Reactions   Carisoprodol Other (See Comments)   Reaction:  Hypersomnolence    Phenergan [promethazine] Other (See Comments)   Reaction:  Hallucinations    Norco [hydrocodone-acetaminophen] Itching, Rash   Pravastatin Rash      Medication List    STOP taking these medications   ibuprofen 200 MG tablet Commonly known as:  ADVIL,MOTRIN   lisinopril-hydrochlorothiazide 20-12.5 MG tablet Commonly known as:  PRINZIDE,ZESTORETIC     TAKE these medications   albuterol (2.5 MG/3ML) 0.083% nebulizer solution Commonly known as:  PROVENTIL Take 3 mLs (2.5 mg total) by nebulization every 4 (four) hours as needed for wheezing or shortness of breath.   ALPRAZolam 0.5 MG tablet Commonly known as:  XANAX Take 0.5-1 tablets (0.25-0.5 mg total) by mouth 2 (two) times daily as needed for anxiety or sleep.   alum & mag hydroxide-simeth 200-200-20 MG/5ML suspension Commonly known as:  MAALOX/MYLANTA Take 15 mLs by mouth every 4 (four) hours as needed for indigestion or heartburn.   amLODipine 5 MG tablet Commonly known as:  NORVASC Take 1 tablet (5 mg total) by mouth daily.   cefdinir 300 MG capsule Commonly known as:  OMNICEF Take 1 capsule (300 mg total) by mouth daily.   ferrous sulfate 325 (65 FE) MG tablet Take 325 mg by mouth daily.   guaiFENesin-dextromethorphan 100-10 MG/5ML syrup Commonly known as:  ROBITUSSIN DM Take 5 mLs by  mouth every 4 (four) hours as needed for cough.   metoprolol tartrate 25 MG tablet Commonly known as:  LOPRESSOR Take 25 mg by mouth daily.   nitroGLYCERIN 0.4 MG SL tablet Commonly known as:  NITROSTAT Place 1 tablet (0.4 mg total) under the tongue every 5 (five) minutes as needed for chest pain.   omeprazole 20 MG capsule Commonly known as:  PRILOSEC Take 20 mg by mouth daily.   sertraline  50 MG tablet Commonly known as:  ZOLOFT Take 50 mg by mouth daily.   tiZANidine 2 MG tablet Commonly known as:  ZANAFLEX Take 1 mg by mouth as needed for muscle spasms.         Today   CHIEF COMPLAINT:  No acute events overnight   VITAL SIGNS:  Blood pressure (!) 135/53, pulse (!) 57, temperature 98.3 F (36.8 C), temperature source Oral, resp. rate 12, height 5\' 1"  (1.549 m), weight 49.7 kg, SpO2 97 %.   REVIEW OF SYSTEMS:  Review of Systems  Constitutional: Negative.  Negative for chills, fever and malaise/fatigue.  HENT: Negative.  Negative for ear discharge, ear pain, hearing loss, nosebleeds and sore throat.   Eyes: Negative.  Negative for blurred vision and pain.  Respiratory: Negative.  Negative for cough, hemoptysis, shortness of breath and wheezing.   Cardiovascular: Negative.  Negative for chest pain, palpitations and leg swelling.  Gastrointestinal: Negative.  Negative for abdominal pain, blood in stool, diarrhea, nausea and vomiting.  Genitourinary: Negative.  Negative for dysuria.  Musculoskeletal: Negative.  Negative for back pain.  Skin: Negative.   Neurological: Negative for dizziness, tremors, speech change, focal weakness, seizures and headaches.  Endo/Heme/Allergies: Negative.  Does not bruise/bleed easily.  Psychiatric/Behavioral: Positive for memory loss. Negative for depression, hallucinations and suicidal ideas.     PHYSICAL EXAMINATION:  GENERAL:  84 y.o.-year-old patient lying in the bed with no acute distress. frail NECK:  Supple, no jugular venous distention. No thyroid enlargement, no tenderness.  LUNGS: decreased throughout  no wheezing, rales,rhonchi  No use of accessory muscles of respiration.  CARDIOVASCULAR: S1, S2 normal. No murmurs, rubs, or gallops.  ABDOMEN: Soft, non-tender, non-distended. Bowel sounds present. No organomegaly or mass.  EXTREMITIES: No pedal edema, cyanosis, or clubbing.  PSYCHIATRIC: The patient is alert and  oriented x 3.  SKIN: No obvious rash, lesion, or ulcer.   DATA REVIEW:   CBC Recent Labs  Lab 01/06/18 0505  WBC 7.1  HGB 9.5*  HCT 29.9*  PLT 199    Chemistries  Recent Labs  Lab 12/31/17 1112  01/02/18 0340 01/03/18 0435  NA 138   < > 137 138  K 3.0*   < > 3.4* 3.8  CL 106   < > 113* 115*  CO2 21*   < > 19* 19*  GLUCOSE 92   < > 67* 88  BUN 24*   < > 34* 30*  CREATININE 1.44*   < > 1.52* 1.23*  CALCIUM 7.7*   < > 7.5* 7.7*  MG  --    < > 2.3  --   AST 25  --   --   --   ALT 9  --   --   --   ALKPHOS 43  --   --   --   BILITOT 0.8  --   --   --    < > = values in this interval not displayed.    Cardiac Enzymes Recent Labs  Lab 12/31/17 1112 12/31/17  1522 12/31/17 2156  TROPONINI 0.95* 1.61* 1.79*    Microbiology Results  @MICRORSLT48 @  RADIOLOGY:  No results found.    Allergies as of 01/07/2018      Reactions   Carisoprodol Other (See Comments)   Reaction:  Hypersomnolence    Phenergan [promethazine] Other (See Comments)   Reaction:  Hallucinations    Norco [hydrocodone-acetaminophen] Itching, Rash   Pravastatin Rash      Medication List    STOP taking these medications   ibuprofen 200 MG tablet Commonly known as:  ADVIL,MOTRIN   lisinopril-hydrochlorothiazide 20-12.5 MG tablet Commonly known as:  PRINZIDE,ZESTORETIC     TAKE these medications   albuterol (2.5 MG/3ML) 0.083% nebulizer solution Commonly known as:  PROVENTIL Take 3 mLs (2.5 mg total) by nebulization every 4 (four) hours as needed for wheezing or shortness of breath.   ALPRAZolam 0.5 MG tablet Commonly known as:  XANAX Take 0.5-1 tablets (0.25-0.5 mg total) by mouth 2 (two) times daily as needed for anxiety or sleep.   alum & mag hydroxide-simeth 200-200-20 MG/5ML suspension Commonly known as:  MAALOX/MYLANTA Take 15 mLs by mouth every 4 (four) hours as needed for indigestion or heartburn.   amLODipine 5 MG tablet Commonly known as:  NORVASC Take 1 tablet (5 mg  total) by mouth daily.   cefdinir 300 MG capsule Commonly known as:  OMNICEF Take 1 capsule (300 mg total) by mouth daily.   ferrous sulfate 325 (65 FE) MG tablet Take 325 mg by mouth daily.   guaiFENesin-dextromethorphan 100-10 MG/5ML syrup Commonly known as:  ROBITUSSIN DM Take 5 mLs by mouth every 4 (four) hours as needed for cough.   metoprolol tartrate 25 MG tablet Commonly known as:  LOPRESSOR Take 25 mg by mouth daily.   nitroGLYCERIN 0.4 MG SL tablet Commonly known as:  NITROSTAT Place 1 tablet (0.4 mg total) under the tongue every 5 (five) minutes as needed for chest pain.   omeprazole 20 MG capsule Commonly known as:  PRILOSEC Take 20 mg by mouth daily.   sertraline 50 MG tablet Commonly known as:  ZOLOFT Take 50 mg by mouth daily.   tiZANidine 2 MG tablet Commonly known as:  ZANAFLEX Take 1 mg by mouth as needed for muscle spasms.          Management plans discussed with the patient and she is in agreement. Stable for discharge   Patient should follow up with pcp  CODE STATUS:     Code Status Orders  (From admission, onward)         Start     Ordered   12/31/17 1258  Full code  Continuous     12/31/17 1258        Code Status History    Date Active Date Inactive Code Status Order ID Comments User Context   02/05/2017 2044 02/06/2017 1741 Full Code [id number]  Henreitta Leber, MD Inpatient   09/30/2015 0614 10/02/2015 1521 Full Code [id number]  Harrie Foreman, MD Inpatient   07/24/2015 1800 07/25/2015 1808 Full Code [id number]  Epifanio Lesches, MD ED   07/22/2015 1622 07/24/2015 1521 Full Code [id number]  Idelle Crouch, MD Inpatient      TOTAL TIME TAKING CARE OF THIS PATIENT: 38 minutes.    Note: This dictation was prepared with Dragon dictation along with smaller phrase technology. Any transcriptional errors that result from this process are unintentional.  Eberardo Demello M.D on 01/07/2018 at 10:53 AM  Between 7am to 6pm - Pager -  [phone] After 6pm go to [email or web address] - password [email or web address] [facility]  Office  [phone]  CC: Primary care physician; Sofie Hartigan, MD

## 2018-01-07 NOTE — Clinical Social Work Note (Signed)
Patient to be d/c'ed today to [facility].  Patient and family agreeable to plans will transport via ems RN to call report to room 712 [phone], 700 hall nurse.  [name] is at bedside and aware that patient will be discharging today.  Evette Cristal, MSW, [place]

## 2018-01-07 NOTE — Clinical Social Work Placement (Signed)
   CLINICAL SOCIAL WORK PLACEMENT  NOTE  Date:  01/07/2018  Patient Details  Name: Quentina Fronek MRN: [id number] Date of Birth: Feb 12, 1923  Clinical Social Work is seeking post-discharge placement for this patient at the [place] level of care (*CSW will initial, date and re-position this form in  chart as items are completed):  Yes   Patient/family provided with [facility] Work Department's list of facilities offering this level of care within the geographic area requested by the patient (or if unable, by the patient's family).  Yes   Patient/family informed of their freedom to choose among providers that offer the needed level of care, that participate in Medicare, Medicaid or managed care program needed by the patient, have an available bed and are willing to accept the patient.  Yes   Patient/family informed of [facility]'s ownership interest in [facility] and [facility], as well as of the fact that they are under no obligation to receive care at these facilities.  PASRR submitted to EDS on 01/05/18     PASRR number received on       Existing PASRR number confirmed on 01/05/18     FL2 transmitted to all facilities in geographic area requested by pt/family on 01/05/18     FL2 transmitted to all facilities within larger geographic area on       Patient informed that his/her managed care company has contracts with or will negotiate with certain facilities, including the following:        Yes   Patient/family informed of bed offers received.  [facility]     Physician recommends and patient chooses bed at      Patient to be transferred to [facility] on 01/07/18.  Patient to be transferred to facility by [facility]     Patient family notified on 01/07/18 of transfer.  Name of family member notified:  [name] [name] [phone]     PHYSICIAN Please sign FL2,  Please prepare prescriptions     Additional Comment:    _______________________________________________ Ross Ludwig, [place] 01/07/2018, 11:47 AM

## 2018-01-10 DIAGNOSIS — J189 Pneumonia, unspecified organism: Secondary | ICD-10-CM | POA: Diagnosis not present

## 2018-01-10 DIAGNOSIS — I10 Essential (primary) hypertension: Secondary | ICD-10-CM | POA: Diagnosis not present

## 2018-01-10 DIAGNOSIS — I4891 Unspecified atrial fibrillation: Secondary | ICD-10-CM | POA: Diagnosis not present

## 2018-01-10 DIAGNOSIS — A419 Sepsis, unspecified organism: Secondary | ICD-10-CM | POA: Diagnosis not present

## 2018-01-10 DIAGNOSIS — F418 Other specified anxiety disorders: Secondary | ICD-10-CM | POA: Diagnosis not present

## 2018-01-15 DIAGNOSIS — F418 Other specified anxiety disorders: Secondary | ICD-10-CM | POA: Diagnosis not present

## 2018-01-15 DIAGNOSIS — I10 Essential (primary) hypertension: Secondary | ICD-10-CM | POA: Diagnosis not present

## 2018-01-15 DIAGNOSIS — J189 Pneumonia, unspecified organism: Secondary | ICD-10-CM | POA: Diagnosis not present

## 2018-01-15 DIAGNOSIS — I4891 Unspecified atrial fibrillation: Secondary | ICD-10-CM | POA: Diagnosis not present

## 2018-01-15 DIAGNOSIS — D649 Anemia, unspecified: Secondary | ICD-10-CM | POA: Diagnosis not present

## 2018-01-18 DIAGNOSIS — J9601 Acute respiratory failure with hypoxia: Secondary | ICD-10-CM | POA: Diagnosis not present

## 2018-01-18 DIAGNOSIS — Z515 Encounter for palliative care: Secondary | ICD-10-CM | POA: Diagnosis not present

## 2018-01-20 ENCOUNTER — Other Ambulatory Visit: Payer: Self-pay | Admitting: *Deleted

## 2018-01-20 NOTE — Patient Outreach (Addendum)
[facility]) Care Management  01/20/2018  Colleen Nguyen [birth year] [id number]  Post Acute Care Coordination Phone call from [place] and discharge planner at [facility] to request that patient be followed at home.  In addition, they would like patient to be referred for the 30 day reduction in care through [facility] to serve as a bridge while personal care services through [facility] is arranged.  Phone call today to patient's daughter Colleen Nguyen  to discuss [facility] care management services and to obtain consent for services. [name] provides verbal consent, however she was not with patient.  Phone call to patient's room at [facility], patient's son Colleen Nguyen at bedside  [phone]) -Colleen Nguyen wife. Discussed [facility] program with patient and son who both agree to [facility] care management services and referral for personal care services to serve as a bridge towards personal care services through [employer].  Plan: This social worker will follow up with patient post discharge from [facility] on 01/22/18. This Education officer, museum will refer patient to [facility] for the 30 day reduction in care program. This social worker will refer patient to [facility] for transition of care.   Colleen Nguyen [facility] Care Management [phone]

## 2018-01-21 ENCOUNTER — Encounter: Payer: Self-pay | Admitting: *Deleted

## 2018-01-21 NOTE — Addendum Note (Signed)
Addended by: Vern Claude on: 01/21/2018 10:47 AM   Modules accepted: Orders

## 2018-01-22 NOTE — Telephone Encounter (Signed)
This encounter was created in error - please disregard.

## 2018-01-24 DIAGNOSIS — F419 Anxiety disorder, unspecified: Secondary | ICD-10-CM | POA: Diagnosis not present

## 2018-01-24 DIAGNOSIS — I480 Paroxysmal atrial fibrillation: Secondary | ICD-10-CM | POA: Diagnosis not present

## 2018-01-24 DIAGNOSIS — M6281 Muscle weakness (generalized): Secondary | ICD-10-CM | POA: Diagnosis not present

## 2018-01-24 DIAGNOSIS — Z8701 Personal history of pneumonia (recurrent): Secondary | ICD-10-CM | POA: Diagnosis not present

## 2018-01-24 DIAGNOSIS — D649 Anemia, unspecified: Secondary | ICD-10-CM | POA: Diagnosis not present

## 2018-01-24 DIAGNOSIS — I5032 Chronic diastolic (congestive) heart failure: Secondary | ICD-10-CM | POA: Diagnosis not present

## 2018-01-24 DIAGNOSIS — I214 Non-ST elevation (NSTEMI) myocardial infarction: Secondary | ICD-10-CM | POA: Diagnosis not present

## 2018-01-24 DIAGNOSIS — I110 Hypertensive heart disease with heart failure: Secondary | ICD-10-CM | POA: Diagnosis not present

## 2018-01-24 DIAGNOSIS — I447 Left bundle-branch block, unspecified: Secondary | ICD-10-CM | POA: Diagnosis not present

## 2018-01-25 ENCOUNTER — Inpatient Hospital Stay: Payer: Medicare HMO

## 2018-01-25 ENCOUNTER — Emergency Department: Payer: Medicare HMO

## 2018-01-25 ENCOUNTER — Encounter: Payer: Self-pay | Admitting: Emergency Medicine

## 2018-01-25 ENCOUNTER — Inpatient Hospital Stay
Admission: EM | Admit: 2018-01-25 | Discharge: 2018-01-28 | DRG: 187 | Disposition: A | Discharge status: Skilled Nursing Facility | Payer: Medicare HMO | Attending: Internal Medicine | Admitting: Internal Medicine

## 2018-01-25 ENCOUNTER — Other Ambulatory Visit: Payer: Self-pay

## 2018-01-25 DIAGNOSIS — R1312 Dysphagia, oropharyngeal phase: Secondary | ICD-10-CM | POA: Diagnosis not present

## 2018-01-25 DIAGNOSIS — I5032 Chronic diastolic (congestive) heart failure: Secondary | ICD-10-CM | POA: Diagnosis present

## 2018-01-25 DIAGNOSIS — K219 Gastro-esophageal reflux disease without esophagitis: Secondary | ICD-10-CM | POA: Diagnosis present

## 2018-01-25 DIAGNOSIS — Z90710 Acquired absence of both cervix and uterus: Secondary | ICD-10-CM

## 2018-01-25 DIAGNOSIS — F064 Anxiety disorder due to known physiological condition: Secondary | ICD-10-CM | POA: Diagnosis not present

## 2018-01-25 DIAGNOSIS — R0602 Shortness of breath: Secondary | ICD-10-CM | POA: Diagnosis not present

## 2018-01-25 DIAGNOSIS — Z8249 Family history of ischemic heart disease and other diseases of the circulatory system: Secondary | ICD-10-CM

## 2018-01-25 DIAGNOSIS — R498 Other voice and resonance disorders: Secondary | ICD-10-CM | POA: Diagnosis not present

## 2018-01-25 DIAGNOSIS — R079 Chest pain, unspecified: Secondary | ICD-10-CM

## 2018-01-25 DIAGNOSIS — D649 Anemia, unspecified: Secondary | ICD-10-CM | POA: Diagnosis present

## 2018-01-25 DIAGNOSIS — J90 Pleural effusion, not elsewhere classified: Secondary | ICD-10-CM | POA: Diagnosis not present

## 2018-01-25 DIAGNOSIS — J9811 Atelectasis: Secondary | ICD-10-CM | POA: Diagnosis present

## 2018-01-25 DIAGNOSIS — I10 Essential (primary) hypertension: Secondary | ICD-10-CM | POA: Diagnosis not present

## 2018-01-25 DIAGNOSIS — I4891 Unspecified atrial fibrillation: Secondary | ICD-10-CM | POA: Diagnosis present

## 2018-01-25 DIAGNOSIS — R1310 Dysphagia, unspecified: Secondary | ICD-10-CM | POA: Diagnosis present

## 2018-01-25 DIAGNOSIS — F419 Anxiety disorder, unspecified: Secondary | ICD-10-CM | POA: Diagnosis present

## 2018-01-25 DIAGNOSIS — R609 Edema, unspecified: Secondary | ICD-10-CM | POA: Diagnosis not present

## 2018-01-25 DIAGNOSIS — Z806 Family history of leukemia: Secondary | ICD-10-CM

## 2018-01-25 DIAGNOSIS — Z823 Family history of stroke: Secondary | ICD-10-CM | POA: Diagnosis not present

## 2018-01-25 DIAGNOSIS — J189 Pneumonia, unspecified organism: Secondary | ICD-10-CM | POA: Diagnosis not present

## 2018-01-25 DIAGNOSIS — J159 Unspecified bacterial pneumonia: Secondary | ICD-10-CM | POA: Diagnosis not present

## 2018-01-25 DIAGNOSIS — Z8701 Personal history of pneumonia (recurrent): Secondary | ICD-10-CM

## 2018-01-25 DIAGNOSIS — Z888 Allergy status to other drugs, medicaments and biological substances status: Secondary | ICD-10-CM

## 2018-01-25 DIAGNOSIS — M6281 Muscle weakness (generalized): Secondary | ICD-10-CM | POA: Diagnosis not present

## 2018-01-25 DIAGNOSIS — Z885 Allergy status to narcotic agent status: Secondary | ICD-10-CM | POA: Diagnosis not present

## 2018-01-25 DIAGNOSIS — E041 Nontoxic single thyroid nodule: Secondary | ICD-10-CM | POA: Diagnosis present

## 2018-01-25 DIAGNOSIS — H3530 Unspecified macular degeneration: Secondary | ICD-10-CM | POA: Diagnosis present

## 2018-01-25 DIAGNOSIS — I110 Hypertensive heart disease with heart failure: Secondary | ICD-10-CM | POA: Diagnosis not present

## 2018-01-25 DIAGNOSIS — I700 Atherosclerosis of aorta: Secondary | ICD-10-CM | POA: Diagnosis present

## 2018-01-25 DIAGNOSIS — Z66 Do not resuscitate: Secondary | ICD-10-CM | POA: Diagnosis not present

## 2018-01-25 DIAGNOSIS — R531 Weakness: Secondary | ICD-10-CM | POA: Diagnosis not present

## 2018-01-25 DIAGNOSIS — D508 Other iron deficiency anemias: Secondary | ICD-10-CM | POA: Diagnosis not present

## 2018-01-25 DIAGNOSIS — R2681 Unsteadiness on feet: Secondary | ICD-10-CM | POA: Diagnosis not present

## 2018-01-25 DIAGNOSIS — Z9889 Other specified postprocedural states: Secondary | ICD-10-CM

## 2018-01-25 DIAGNOSIS — J9601 Acute respiratory failure with hypoxia: Secondary | ICD-10-CM | POA: Diagnosis not present

## 2018-01-25 DIAGNOSIS — R05 Cough: Secondary | ICD-10-CM | POA: Diagnosis not present

## 2018-01-25 DIAGNOSIS — R262 Difficulty in walking, not elsewhere classified: Secondary | ICD-10-CM | POA: Diagnosis not present

## 2018-01-25 LAB — URINALYSIS, COMPLETE (UACMP) WITH MICROSCOPIC
Bilirubin Urine: NEGATIVE
Glucose, UA: NEGATIVE mg/dL
Ketones, ur: NEGATIVE mg/dL
Nitrite: NEGATIVE
Protein, ur: NEGATIVE mg/dL
Specific Gravity, Urine: 1.01 (ref 1.005–1.030)
pH: 6 (ref 5.0–8.0)

## 2018-01-25 LAB — BODY FLUID CELL COUNT WITH DIFFERENTIAL
EOS FL: 18 %
LYMPHS FL: 41 %
Monocyte-Macrophage-Serous Fluid: 3 %
Neutrophil Count, Fluid: 31 %
OTHER CELLS FL: 6 %
Total Nucleated Cell Count, Fluid: 1966 cu mm

## 2018-01-25 LAB — CBC WITH DIFFERENTIAL/PLATELET
Abs Immature Granulocytes: 0.06 10*3/uL (ref 0.00–0.07)
BASOS PCT: 0 %
Basophils Absolute: 0 10*3/uL (ref 0.0–0.1)
Eosinophils Absolute: 0.5 10*3/uL (ref 0.0–0.5)
Eosinophils Relative: 7 %
HCT: 29 % — ABNORMAL LOW (ref 36.0–46.0)
Hemoglobin: 9.2 g/dL — ABNORMAL LOW (ref 12.0–15.0)
IMMATURE GRANULOCYTES: 1 %
LYMPHS ABS: 1.5 10*3/uL (ref 0.7–4.0)
Lymphocytes Relative: 23 %
MCH: 29.1 pg (ref 26.0–34.0)
MCHC: 31.7 g/dL (ref 30.0–36.0)
MCV: 91.8 fL (ref 80.0–100.0)
MONO ABS: 0.8 10*3/uL (ref 0.1–1.0)
MONOS PCT: 13 %
NEUTROS PCT: 56 %
Neutro Abs: 3.7 10*3/uL (ref 1.7–7.7)
Platelets: 367 10*3/uL (ref 150–400)
RBC: 3.16 MIL/uL — ABNORMAL LOW (ref 3.87–5.11)
RDW: 14.6 % (ref 11.5–15.5)
WBC: 6.6 10*3/uL (ref 4.0–10.5)
nRBC: 0 % (ref 0.0–0.2)

## 2018-01-25 LAB — COMPREHENSIVE METABOLIC PANEL WITH GFR
ALT: 14 U/L (ref 0–44)
AST: 21 U/L (ref 15–41)
Albumin: 2.2 g/dL — ABNORMAL LOW (ref 3.5–5.0)
Alkaline Phosphatase: 77 U/L (ref 38–126)
Anion gap: 8 (ref 5–15)
BUN: 17 mg/dL (ref 8–23)
CO2: 21 mmol/L — ABNORMAL LOW (ref 22–32)
Calcium: 8.3 mg/dL — ABNORMAL LOW (ref 8.9–10.3)
Chloride: 107 mmol/L (ref 98–111)
Creatinine, Ser: 0.82 mg/dL (ref 0.44–1.00)
GFR calc Af Amer: >60 mL/min
GFR calc non Af Amer: 60 mL/min — ABNORMAL LOW
Glucose, Bld: 83 mg/dL (ref 70–99)
Potassium: 3.5 mmol/L (ref 3.5–5.1)
Sodium: 136 mmol/L (ref 135–145)
Total Bilirubin: 0.8 mg/dL (ref 0.3–1.2)
Total Protein: 6.6 g/dL (ref 6.5–8.1)

## 2018-01-25 LAB — LACTATE DEHYDROGENASE, PLEURAL OR PERITONEAL FLUID: LD FL: 334 U/L — AB (ref 3–23)

## 2018-01-25 LAB — PROTEIN, PLEURAL OR PERITONEAL FLUID: Total protein, fluid: 3.9 g/dL

## 2018-01-25 LAB — LACTATE DEHYDROGENASE: LDH: 187 U/L (ref 98–192)

## 2018-01-25 LAB — TROPONIN I: Troponin I: <0.03 ng/mL

## 2018-01-25 MED ORDER — LEVOFLOXACIN IN D5W 750 MG/150ML IV SOLN
750.0000 mg | INTRAVENOUS | Status: DC
  Administered 2018-01-27: 11:00:00 750 mg via INTRAVENOUS
  Filled 2018-01-25: qty 150

## 2018-01-25 MED ORDER — ALPRAZOLAM 0.25 MG PO TABS
0.2500 mg | ORAL_TABLET | Freq: Two times a day (BID) | ORAL | Status: DC | PRN
  Administered 2018-01-25 – 2018-01-26 (×2): 0.25 mg via ORAL
  Administered 2018-01-27: 0.5 mg via ORAL
  Filled 2018-01-25: qty 1
  Filled 2018-01-25: qty 2
  Filled 2018-01-25: qty 1

## 2018-01-25 MED ORDER — ALBUTEROL SULFATE (2.5 MG/3ML) 0.083% IN NEBU: 2.5000 mg | INHALATION_SOLUTION | RESPIRATORY_TRACT | Status: DC | PRN

## 2018-01-25 MED ORDER — ACETAMINOPHEN 325 MG PO TABS
650.0000 mg | ORAL_TABLET | Freq: Four times a day (QID) | ORAL | Status: DC | PRN
  Administered 2018-01-26 – 2018-01-27 (×2): 650 mg via ORAL
  Filled 2018-01-25 (×2): qty 2

## 2018-01-25 MED ORDER — ACETAMINOPHEN 650 MG RE SUPP: 650.0000 mg | Freq: Four times a day (QID) | RECTAL | Status: DC | PRN

## 2018-01-25 MED ORDER — ENOXAPARIN SODIUM 40 MG/0.4ML ~~LOC~~ SOLN
40.0000 mg | SUBCUTANEOUS | Status: DC
  Administered 2018-01-26: 40 mg via SUBCUTANEOUS
  Filled 2018-01-25: qty 0.4

## 2018-01-25 MED ORDER — IOHEXOL 300 MG/ML  SOLN
75.0000 mL | Freq: Once | INTRAMUSCULAR | Status: AC | PRN
  Administered 2018-01-25: 75 mL via INTRAVENOUS

## 2018-01-25 MED ORDER — ORAL CARE MOUTH RINSE
15.0000 mL | Freq: Two times a day (BID) | OROMUCOSAL | Status: DC
  Administered 2018-01-26 – 2018-01-27 (×4): 15 mL via OROMUCOSAL

## 2018-01-25 MED ORDER — ONDANSETRON HCL 4 MG/2ML IJ SOLN: 4.0000 mg | Freq: Four times a day (QID) | INTRAMUSCULAR | Status: DC | PRN

## 2018-01-25 MED ORDER — LEVOFLOXACIN IN D5W 750 MG/150ML IV SOLN
750.0000 mg | Freq: Once | INTRAVENOUS | Status: AC
  Administered 2018-01-25: 750 mg via INTRAVENOUS
  Filled 2018-01-25: qty 150

## 2018-01-25 MED ORDER — METOPROLOL TARTRATE 25 MG PO TABS
25.0000 mg | ORAL_TABLET | Freq: Every day | ORAL | Status: DC
  Administered 2018-01-25 – 2018-01-28 (×4): 25 mg via ORAL
  Filled 2018-01-25 (×4): qty 1

## 2018-01-25 MED ORDER — POLYETHYLENE GLYCOL 3350 17 G PO PACK: 17.0000 g | PACK | Freq: Every day | ORAL | Status: DC | PRN

## 2018-01-25 MED ORDER — ONDANSETRON HCL 4 MG PO TABS: 4.0000 mg | ORAL_TABLET | Freq: Four times a day (QID) | ORAL | Status: DC | PRN

## 2018-01-25 MED ORDER — PANTOPRAZOLE SODIUM 40 MG PO TBEC
40.0000 mg | DELAYED_RELEASE_TABLET | Freq: Every day | ORAL | Status: DC
  Administered 2018-01-26 – 2018-01-28 (×2): 40 mg via ORAL
  Filled 2018-01-25 (×2): qty 1

## 2018-01-25 MED ORDER — TIZANIDINE HCL 2 MG PO TABS
1.0000 mg | ORAL_TABLET | Freq: Four times a day (QID) | ORAL | Status: DC | PRN
  Administered 2018-01-27: 20:00:00 1 mg via ORAL
  Filled 2018-01-25 (×2): qty 0.5

## 2018-01-25 MED ORDER — NITROGLYCERIN 0.4 MG SL SUBL: 0.4000 mg | SUBLINGUAL_TABLET | SUBLINGUAL | Status: DC | PRN

## 2018-01-25 MED ORDER — FERROUS SULFATE 325 (65 FE) MG PO TABS
325.0000 mg | ORAL_TABLET | Freq: Every day | ORAL | Status: DC
  Administered 2018-01-25 – 2018-01-28 (×4): 325 mg via ORAL
  Filled 2018-01-25 (×4): qty 1

## 2018-01-25 NOTE — ED Provider Notes (Signed)
[facility] Emergency Department Provider Note       Time seen: ----------------------------------------- 11:15 AM on 01/25/2018 -----------------------------------------   I have reviewed the triage vital signs and the nursing notes.  HISTORY   Chief Complaint No chief complaint on file.   HPI Colleen Nguyen is a 84 y.o. female with a history of anemia, anxiety, A. fib, diastolic heart failure, hypertension who presents to the ED for persistent weakness, cough, dysphagia and possibly persistent pneumonia.  Patient had a home health nurse to an evaluation who thought there was likely still pneumonia there.  Recently she had been treated for same.  She denies any fevers, chills or chest pain.  Past Medical History:  Diagnosis Date  . Anemia   . Anxiety   . Atrial fibrillation ([place])   . Diastolic CHF ([place])   . Hypertension     Patient Active Problem List   Diagnosis Date Noted  . Palliative care by specialist   . Goals of care, counseling/discussion   . Pneumonia due to infectious organism   . NSTEMI (non-ST elevated myocardial infarction) ([place]) 12/31/2017  . UTI (urinary tract infection) 02/05/2017  . Atrial fibrillation with RVR ([place]) 09/30/2015  . Bradycardia 07/24/2015  . Malnutrition of moderate degree 07/23/2015  . Iron deficiency anemia, unspecified   . Iron deficiency anemia secondary to blood loss (chronic)   . Neoplasm of digestive system   . Acute blood loss anemia 07/22/2015  . GI bleed 07/22/2015  . Weakness 07/22/2015  . Chronic atrial fibrillation 07/22/2015    Past Surgical History:  Procedure Laterality Date  . ABDOMINAL HYSTERECTOMY    . APPENDECTOMY    . COLONOSCOPY WITH PROPOFOL N/A 07/23/2015   Procedure: COLONOSCOPY WITH PROPOFOL;  Surgeon: Lucilla Lame, MD;  Location: [facility] ENDOSCOPY;  Service: Endoscopy;  Laterality: N/A;  . ESOPHAGOGASTRODUODENOSCOPY (EGD) WITH PROPOFOL N/A 07/23/2015   Procedure:  ESOPHAGOGASTRODUODENOSCOPY (EGD) WITH PROPOFOL;  Surgeon: Lucilla Lame, MD;  Location: [facility] ENDOSCOPY;  Service: Endoscopy;  Laterality: N/A;    Allergies Carisoprodol; Phenergan [promethazine]; Norco [hydrocodone-acetaminophen]; and Pravastatin  Social History Social History   Tobacco Use  . Smoking status: Never Smoker  . Smokeless tobacco: Never Used  Substance Use Topics  . Alcohol use: No  . Drug use: No   Review of Systems Constitutional: Negative for fever. Cardiovascular: Negative for chest pain. Respiratory: Positive for shortness of breath and cough Gastrointestinal: Negative for abdominal pain, vomiting and diarrhea. Musculoskeletal: Negative for back pain. Skin: Negative for rash. Neurological: Negative for headaches, positive for generalized weakness  All systems negative/normal/unremarkable except as stated in the HPI  ____________________________________________   PHYSICAL EXAM:  VITAL SIGNS: ED Triage Vitals  Enc Vitals Group     BP      Pulse      Resp      Temp      Temp src      SpO2      Weight      Height      Head Circumference      Peak Flow      Pain Score      Pain Loc      Pain Edu?      Excl. in [place]?    Constitutional: Alert and oriented.  Chronically ill-appearing, no distress Eyes: Conjunctivae are normal. Normal extraocular movements. ENT   Head: Normocephalic and atraumatic.   Nose: No congestion/rhinnorhea.   Mouth/Throat: Mucous membranes are moist.   Neck: No stridor. Cardiovascular: Normal rate,  regular rhythm. No murmurs, rubs, or gallops. Respiratory: Normal respiratory effort without tachypnea nor retractions. Breath sounds are clear and equal bilaterally. No wheezes/rales/rhonchi. Gastrointestinal: Soft and nontender. Normal bowel sounds Musculoskeletal: Nontender with normal range of motion in extremities. No lower extremity tenderness nor edema. Neurologic:  Normal speech and language. No gross focal  neurologic deficits are appreciated.  Skin:  Skin is warm, dry and intact. No rash noted. Psychiatric: Mood and affect are normal. Speech and behavior are normal.  ____________________________________________  ED COURSE:  As part of my medical decision making, I reviewed the following data within the [place] History obtained from family if available, nursing notes, old chart and ekg, as well as notes from prior ED visits. Patient presented for weakness and possible persistent pneumonia, we will assess with labs and imaging as indicated at this time.   Procedures ____________________________________________   LABS (pertinent positives/negatives)  Labs Reviewed  CBC WITH DIFFERENTIAL/PLATELET - Abnormal; Notable for the following components:      Result Value   RBC 3.16 (*)    Hemoglobin 9.2 (*)    HCT 29.0 (*)    All other components within normal limits  COMPREHENSIVE METABOLIC PANEL - Abnormal; Notable for the following components:   CO2 21 (*)    Calcium 8.3 (*)    Albumin 2.2 (*)    GFR calc non Af Amer 60 (*)    All other components within normal limits  URINALYSIS, COMPLETE (UACMP) WITH MICROSCOPIC - Abnormal; Notable for the following components:   Color, Urine YELLOW (*)    APPearance HAZY (*)    Hgb urine dipstick MODERATE (*)    Leukocytes, UA LARGE (*)    All other components within normal limits  TROPONIN I  CBG MONITORING, ED    RADIOLOGY Images were viewed by me  Chest x-ray IMPRESSION: Large left pleural effusion is noted with associated atelectasis of the left lower lobe. Small right pleural effusion is noted with adjacent subsegmental atelectasis.  1.1 cm left thyroid nodule is noted. Thyroid ultrasound is recommended for further evaluation.  Focal bulging of medial margin of transverse aortic arch is noted which measures 27 x 8 mm which was not present on prior exam. Follow-up CT scan in 12 months is recommended to ensure  stability.  Aortic Atherosclerosis (ICD10-I70.0). ____________________________________________  DIFFERENTIAL DIAGNOSIS   Dehydration, electrolyte abnormality, malnutrition, pneumonia, anemia, PE, pneumothorax  FINAL ASSESSMENT AND PLAN  Weakness, large left pleural effusion   Plan: The patient had presented for weakness and possible persistent pneumonia. Patient's labs did not reveal any acute process, chronic anemia has been documented prior. Patient's imaging was worrisome for a large left pleural effusion which is increased in size compared to previously.  I will place her on antibiotics as well for possible pneumonia and discussed with hospitalist for admission and will also order an ultrasound-guided drainage of her pleural effusion.   Laurence Aly, MD   Note: This note was generated in part or whole with voice recognition software. Voice recognition is usually quite accurate but there are transcription errors that can and very often do occur. I apologize for any typographical errors that were not detected and corrected.     Earleen Newport, MD 01/25/18 1455

## 2018-01-25 NOTE — Discharge Instructions (Signed)
Thoracentesis, Care After °Refer to this sheet in the next few weeks. These instructions provide you with information about caring for yourself after your procedure. Your health care provider may also give you more specific instructions. Your treatment has been planned according to current medical practices, but problems sometimes occur. Call your health care provider if you have any problems or questions after your procedure. °What can I expect after the procedure? °After your procedure, it is common to have pain at the puncture site. °Follow these instructions at home: °· Take medicines only as directed by your health care provider. °· You may return to your normal diet and normal activities as directed by your health care provider. °· Drink enough fluid to keep your urine clear or pale yellow. °· Do not take baths, swim, or use a hot tub until your health care provider approves. °· Follow your health care provider's instructions about: °? Puncture site care. °? Bandage (dressing) changes and removal. °· Check your puncture site every day for signs of infection. Watch for: °? Redness, swelling, or pain. °? Fluid, blood, or pus. °· Keep all follow-up visits as directed by your health care provider. This is important. °Contact a health care provider if: °· You have redness, swelling, or pain at your puncture site. °· You have fluid, blood, or pus coming from your puncture site. °· You have a fever. °· You have chills. °· You have nausea or vomiting. °· You have trouble breathing. °· You develop a worsening cough. °Get help right away if: °· You have extreme shortness of breath. °· You develop chest pain. °· You faint or feel light-headed. °This information is not intended to replace advice given to you by your health care provider. Make sure you discuss any questions you have with your health care provider. °Document Released: 04/07/2014 Document Revised: 11/17/2015 Document Reviewed: 12/27/2013 °Elsevier  Interactive Patient Education © 2018 Elsevier Inc. ° °

## 2018-01-25 NOTE — ED Notes (Signed)
Resumed care from [facility] rn.  Pt in u/s  Pt waiting on admission bed.

## 2018-01-25 NOTE — H&P (Signed)
[place] [facility] [place] NAME: Colleen Nguyen    MR#:  [id number]  DATE OF BIRTH:  1922-11-06  DATE OF ADMISSION:  01/25/2018  PRIMARY CARE PHYSICIAN: Sofie Hartigan, MD   REQUESTING/REFERRING PHYSICIAN: Dr. Jimmye Norman  CHIEF COMPLAINT:   Chief Complaint  Patient presents with  . Chest Pain    HISTORY OF PRESENT ILLNESS:  Colleen Nguyen  is a 84 y.o. female with a known history of atrial fibrillation, hypertension, diastolic CHF, anxiety presents to the emergency room due to left-sided chest pain.  By the time she arrived in the emergency room her chest pain had resolved.  Chest x-ray shows a large left pleural effusion.  Patient was recently treated for left-sided pneumonia.  During that admission she had elevated troponin which was thought to be demand ischemia and not MI.  Recent echocardiogram with normal ejection fraction. Patient is on 2 L oxygen.  PAST MEDICAL HISTORY:   Past Medical History:  Diagnosis Date  . Anemia   . Anxiety   . Atrial fibrillation ([place])   . Diastolic CHF ([place])   . Hypertension     PAST SURGICAL HISTORY:   Past Surgical History:  Procedure Laterality Date  . ABDOMINAL HYSTERECTOMY    . APPENDECTOMY    . COLONOSCOPY WITH PROPOFOL N/A 07/23/2015   Procedure: COLONOSCOPY WITH PROPOFOL;  Surgeon: Lucilla Lame, MD;  Location: [facility] ENDOSCOPY;  Service: Endoscopy;  Laterality: N/A;  . ESOPHAGOGASTRODUODENOSCOPY (EGD) WITH PROPOFOL N/A 07/23/2015   Procedure: ESOPHAGOGASTRODUODENOSCOPY (EGD) WITH PROPOFOL;  Surgeon: Lucilla Lame, MD;  Location: [facility] ENDOSCOPY;  Service: Endoscopy;  Laterality: N/A;    SOCIAL HISTORY:   Social History   Tobacco Use  . Smoking status: Never Smoker  . Smokeless tobacco: Never Used  Substance Use Topics  . Alcohol use: No    FAMILY HISTORY:   Family History  Problem Relation Age of Onset  . Leukemia Mother   . Stroke Father   . CAD Brother     DRUG ALLERGIES:    Allergies  Allergen Reactions  . Carisoprodol Other (See Comments)    Reaction:  Hypersomnolence   . Phenergan [Promethazine] Other (See Comments)    Reaction:  Hallucinations   . Norco [Hydrocodone-Acetaminophen] Itching and Rash  . Pravastatin Rash    REVIEW OF SYSTEMS:   Review of Systems  Constitutional: Positive for malaise/fatigue. Negative for chills and fever.  HENT: Negative for sore throat.   Eyes: Negative for blurred vision, double vision and pain.  Respiratory: Positive for cough. Negative for hemoptysis, shortness of breath and wheezing.   Cardiovascular: Negative for chest pain, palpitations, orthopnea and leg swelling.  Gastrointestinal: Negative for abdominal pain, constipation, diarrhea, heartburn, nausea and vomiting.  Genitourinary: Negative for dysuria and hematuria.  Musculoskeletal: Negative for back pain and joint pain.  Skin: Negative for rash.  Neurological: Negative for sensory change, speech change, focal weakness and headaches.  Endo/Heme/Allergies: Does not bruise/bleed easily.  Psychiatric/Behavioral: Negative for depression. The patient is not nervous/anxious.    MEDICATIONS AT HOME:   Prior to Admission medications   Medication Sig Start Date End Date Taking? Authorizing Provider  ALPRAZolam Duanne Moron) 0.5 MG tablet Take 0.5-1 tablets (0.25-0.5 mg total) by mouth 2 (two) times daily as needed for anxiety or sleep. Patient taking differently: Take 0.5 mg by mouth at bedtime.  01/06/18  Yes Demetrios Loll, MD  alum & mag hydroxide-simeth (MAALOX/MYLANTA) 200-200-20 MG/5ML suspension Take 15 mLs by mouth every 4 (four)  hours as needed for indigestion or heartburn. 01/06/18  Yes Demetrios Loll, MD  amLODipine (NORVASC) 5 MG tablet Take 1 tablet (5 mg total) by mouth daily. 01/06/18 01/06/19 Yes Demetrios Loll, MD  ferrous sulfate 325 (65 FE) MG tablet Take 325 mg by mouth daily.   Yes [provider]  metoprolol tartrate (LOPRESSOR) 25 MG tablet Take 25 mg by  mouth daily.   Yes [provider]  nitroGLYCERIN (NITROSTAT) 0.4 MG SL tablet Place 1 tablet (0.4 mg total) under the tongue every 5 (five) minutes as needed for chest pain. 01/06/18  Yes Demetrios Loll, MD  omeprazole (PRILOSEC) 20 MG capsule Take 20 mg by mouth daily.   Yes [provider]  tiZANidine (ZANAFLEX) 2 MG tablet Take 1 mg by mouth as needed for muscle spasms.   Yes [provider]  albuterol (PROVENTIL) (2.5 MG/3ML) 0.083% nebulizer solution Take 3 mLs (2.5 mg total) by nebulization every 4 (four) hours as needed for wheezing or shortness of breath. 01/06/18   Demetrios Loll, MD  cefdinir (OMNICEF) 300 MG capsule Take 1 capsule (300 mg total) by mouth daily. Patient not taking: Reported on 01/25/2018 01/06/18   Demetrios Loll, MD  guaiFENesin-dextromethorphan [facility] DM) 100-10 MG/5ML syrup Take 5 mLs by mouth every 4 (four) hours as needed for cough. Patient not taking: Reported on 01/25/2018 01/06/18   Demetrios Loll, MD     VITAL SIGNS:  Blood pressure (!) 134/48, pulse (!) 55, temperature (!) 97.5 F (36.4 C), temperature source Oral, resp. rate 20, height 5\' 1"  (1.549 m), weight 46.7 kg, SpO2 96 %.  PHYSICAL EXAMINATION:  Physical Exam  GENERAL:  84 y.o.-year-old patient lying in the bed with no acute distress.  EYES: Pupils equal, round, reactive to light and accommodation. No scleral icterus. Extraocular muscles intact.  HEENT: Head atraumatic, normocephalic. Oropharynx and nasopharynx clear. No oropharyngeal erythema, moist oral mucosa  NECK:  Supple, no jugular venous distention. No thyroid enlargement, no tenderness.  LUNGS: Decreased breath sounds on the left side CARDIOVASCULAR: S1, S2 normal. No murmurs, rubs, or gallops.  ABDOMEN: Soft, nontender, nondistended. Bowel sounds present. No organomegaly or mass.  EXTREMITIES: No pedal edema, cyanosis, or clubbing. + 2 pedal & radial pulses b/l.   NEUROLOGIC: Cranial nerves II through XII are intact. No  focal Motor or sensory deficits appreciated b/l PSYCHIATRIC: The patient is alert and awake SKIN: No obvious rash, lesion, or ulcer.   LABORATORY PANEL:   CBC Recent Labs  Lab 01/25/18 1136  WBC 6.6  HGB 9.2*  HCT 29.0*  PLT 367   ------------------------------------------------------------------------------------------------------------------  Chemistries  Recent Labs  Lab 01/25/18 1136  NA 136  K 3.5  CL 107  CO2 21*  GLUCOSE 83  BUN 17  CREATININE 0.82  CALCIUM 8.3*  AST 21  ALT 14  ALKPHOS 77  BILITOT 0.8   ------------------------------------------------------------------------------------------------------------------  Cardiac Enzymes Recent Labs  Lab 01/25/18 1136  TROPONINI <0.03   ------------------------------------------------------------------------------------------------------------------  RADIOLOGY:  Dg Chest 2 View  Result Date: 01/25/2018 CLINICAL DATA:  Cough and weakness EXAM: CHEST - 2 VIEW COMPARISON:  12/31/2017 FINDINGS: Cardiac shadow is enlarged but stable. Aortic calcifications are again seen. The right lung is clear. Increasing left-sided pleural effusion with likely underlying infiltrate or atelectasis is noted. No acute bony abnormality is seen. IMPRESSION: Increasing left-sided pleural effusion with likely underlying infiltrate/atelectasis. Electronically Signed   By: Inez Catalina M.D.   On: 01/25/2018 12:55   Ct Chest W Contrast  Result Date:  01/25/2018 CLINICAL DATA:  Chest pain. EXAM: CT CHEST WITH CONTRAST TECHNIQUE: Multidetector CT imaging of the chest was performed during intravenous contrast administration. CONTRAST:  3mL OMNIPAQUE IOHEXOL 300 MG/ML  SOLN COMPARISON:  Radiographs of same day.  CT scan of June 03, 2008. FINDINGS: Cardiovascular: Atherosclerosis of thoracic aorta is noted without dissection. Focal bulging of the medial margin of the transverse aortic arch is noted which measures 27 x 8 mm. This was not present  on prior exam. Mild cardiomegaly is noted. No pericardial effusion is noted. Mediastinum/Nodes: 1.1 cm left thyroid nodule is noted. Esophagus is unremarkable. No significant adenopathy is noted. Lungs/Pleura: No pneumothorax is noted. Large left pleural effusion is noted with associated atelectasis of the left lower lobe. Small right pleural effusion is noted with adjacent subsegmental atelectasis. Upper Abdomen: No acute abnormality. Musculoskeletal: No chest wall abnormality. No acute or significant osseous findings. IMPRESSION: Large left pleural effusion is noted with associated atelectasis of the left lower lobe. Small right pleural effusion is noted with adjacent subsegmental atelectasis. 1.1 cm left thyroid nodule is noted. Thyroid ultrasound is recommended for further evaluation. Focal bulging of medial margin of transverse aortic arch is noted which measures 27 x 8 mm which was not present on prior exam. Follow-up CT scan in 12 months is recommended to ensure stability. Aortic Atherosclerosis (ICD10-I70.0). Electronically Signed   By: Marijo Conception, M.D.   On: 01/25/2018 14:22     IMPRESSION AND PLAN:   *Left pleural effusion.  Patient was recently treated for left-sided pneumonia.  Concern for empyema is low considering normal white count and patient is afebrile.  No sepsis.  But will cover with IV Levaquin at this time until we have results from thoracentesis.  Continue oxygen.  Wean as tolerated. Recent echocardiogram with normal ejection fraction.  *Atrial fibrillation.  Continue home medications.  *Chronic diastolic congestive heart failure.  No signs of fluid overload.  *Hypertension.  Continue home medications  *DVT prophylaxis with Lovenox.  Ordered dose for tomorrow as patient will have her thoracentesis today.  All the records are reviewed and case discussed with ED provider. Management plans discussed with the patient, family and they are in agreement.  CODE STATUS: Full  code  TOTAL TIME TAKING CARE OF THIS PATIENT: 40 minutes.   Leia Alf Talton Delpriore M.D on 01/25/2018 at 3:16 PM  Between 7am to 6pm - Pager - [phone]  After 6pm go to [email or web address] - password [email or web address] [facility]  Office  [phone]  CC: Primary care physician; Sofie Hartigan, MD  Note: This dictation was prepared with Dragon dictation along with smaller phrase technology. Any transcriptional errors that result from this process are unintentional.

## 2018-01-25 NOTE — Procedures (Signed)
L thoracentesis 600 cc cloudy yellow fluid. CXR pending EBL 0

## 2018-01-25 NOTE — Progress Notes (Signed)
Pharmacy Antibiotic Note  Colleen Nguyen is a 84 y.o. female admitted on 01/25/2018 with CAP/COPD.  Pharmacy has been consulted for levofloxacin dosing.  Plan: Levofloxacin 750 mg IV Q48H  Height: 5\' 1"  (154.9 cm) Weight: 102 lb 13.9 oz (46.7 kg) IBW/kg (Calculated) : 47.8  Temp (24hrs), Avg:97.5 F (36.4 C), Min:97.5 F (36.4 C), Max:97.5 F (36.4 C)  Recent Labs  Lab 01/25/18 1136  WBC 6.6  CREATININE 0.82    Estimated Creatinine Clearance: 31.6 mL/min (by C-G formula based on SCr of 0.82 mg/dL).    Allergies  Allergen Reactions  . Carisoprodol Other (See Comments)    Reaction:  Hypersomnolence   . Phenergan [Promethazine] Other (See Comments)    Reaction:  Hallucinations   . Norco [Hydrocodone-Acetaminophen] Itching and Rash  . Pravastatin Rash    Antimicrobials this admission: Levofloxacin 750 mg IV x 1 in ED on 10/28   Dose adjustments this admission: Levofloxacin is Q48H due to CrCl 20 to 49 mL/min  Microbiology results:  BCx:   UCx:    Sputum:    MRSA PCR:   Thank you for allowing pharmacy to be a part of this patient's care.  Laural Benes, Pharm.D., BCPS Clinical Pharmacist 01/25/2018 3:31 PM

## 2018-01-25 NOTE — ED Notes (Signed)
Per Daughter, Elta Guadeloupe & Faith Rogue are the only ones allowed to visit unless accompanied by Elta Guadeloupe or [name].

## 2018-01-25 NOTE — ED Notes (Signed)
Pt return from u/s  Pt alert.  Iv in place.  Family with pt.  Pt waiting on admission bed.

## 2018-01-25 NOTE — ED Triage Notes (Signed)
Pt presents via ems from home. EMS called for chest pain, pt denies chest pain now or at any time today. Pt does not speak unless questioned, and then gives coherent, short answers. Pt alert & oriented, states her only pain is in her neck. Pt given support for her neck, which she states is helpful.

## 2018-01-26 ENCOUNTER — Inpatient Hospital Stay: Payer: Medicare HMO

## 2018-01-26 DIAGNOSIS — J90 Pleural effusion, not elsewhere classified: Principal | ICD-10-CM

## 2018-01-26 LAB — CBC
HEMATOCRIT: 30 % — AB (ref 36.0–46.0)
Hemoglobin: 9.4 g/dL — ABNORMAL LOW (ref 12.0–15.0)
MCH: 29.2 pg (ref 26.0–34.0)
MCHC: 31.3 g/dL (ref 30.0–36.0)
MCV: 93.2 fL (ref 80.0–100.0)
Platelets: 381 10*3/uL (ref 150–400)
RBC: 3.22 MIL/uL — ABNORMAL LOW (ref 3.87–5.11)
RDW: 14.8 % (ref 11.5–15.5)
WBC: 6 10*3/uL (ref 4.0–10.5)
nRBC: 0 % (ref 0.0–0.2)

## 2018-01-26 LAB — BASIC METABOLIC PANEL
Anion gap: 8 (ref 5–15)
BUN: 14 mg/dL (ref 8–23)
CALCIUM: 8.1 mg/dL — AB (ref 8.9–10.3)
CHLORIDE: 107 mmol/L (ref 98–111)
CO2: 23 mmol/L (ref 22–32)
CREATININE: 0.78 mg/dL (ref 0.44–1.00)
GFR calc Af Amer: >60 mL/min (ref >60)
GFR calc non Af Amer: >60 mL/min (ref >60)
Glucose, Bld: 69 mg/dL — ABNORMAL LOW (ref 70–99)
Potassium: 3.6 mmol/L (ref 3.5–5.1)
SODIUM: 138 mmol/L (ref 135–145)

## 2018-01-26 LAB — GLUCOSE, CAPILLARY: Glucose-Capillary: 77 mg/dL (ref 70–99)

## 2018-01-26 LAB — PATHOLOGIST SMEAR REVIEW

## 2018-01-26 MED ORDER — ALBUTEROL SULFATE (2.5 MG/3ML) 0.083% IN NEBU
2.5000 mg | INHALATION_SOLUTION | Freq: Four times a day (QID) | RESPIRATORY_TRACT | Status: DC
  Administered 2018-01-26 – 2018-01-28 (×7): 2.5 mg via RESPIRATORY_TRACT
  Filled 2018-01-26 (×8): qty 3

## 2018-01-26 MED ORDER — IBUPROFEN 400 MG PO TABS
400.0000 mg | ORAL_TABLET | Freq: Four times a day (QID) | ORAL | Status: DC | PRN
  Administered 2018-01-26 – 2018-01-28 (×4): 400 mg via ORAL
  Filled 2018-01-26 (×4): qty 1

## 2018-01-26 MED ORDER — ENSURE ENLIVE PO LIQD
237.0000 mL | Freq: Two times a day (BID) | ORAL | Status: DC
  Administered 2018-01-27 – 2018-01-28 (×2): 237 mL via ORAL

## 2018-01-26 NOTE — NC FL2 (Signed)
[place] LEVEL OF CARE SCREENING TOOL     IDENTIFICATION  Patient Name: Colleen Nguyen Birthdate: 12/27/1922 Sex: female Admission Date (Current Location): 01/25/2018  [place] and [place] Number:  Engineering geologist and Address:  [facility], [address], [place], [place] [address]      Provider Number: [id number]  Attending Physician Name and Address:  Loletha Grayer, MD  Relative Name and Phone Number:  Faith Rogue- daughter [phone]    Current Level of Care: Hospital Recommended Level of Care: [place] Prior Approval Number:    Date Approved/Denied:   PASRR Number: [id number]  Discharge Plan: SNF    Current Diagnoses: Patient Active Problem List   Diagnosis Date Noted  . Pleural effusion on left 01/25/2018  . Palliative care by specialist   . Goals of care, counseling/discussion   . Pneumonia due to infectious organism   . NSTEMI (non-ST elevated myocardial infarction) ([place]) 12/31/2017  . UTI (urinary tract infection) 02/05/2017  . Atrial fibrillation with RVR ([place]) 09/30/2015  . Bradycardia 07/24/2015  . Malnutrition of moderate degree 07/23/2015  . Iron deficiency anemia, unspecified   . Iron deficiency anemia secondary to blood loss (chronic)   . Neoplasm of digestive system   . Acute blood loss anemia 07/22/2015  . GI bleed 07/22/2015  . Weakness 07/22/2015  . Chronic atrial fibrillation 07/22/2015    Orientation RESPIRATION BLADDER Height & Weight     Self  O2(1 liter ) Incontinent Weight: 87 lb 6.4 oz (39.6 kg) Height:  5\' 1"  (154.9 cm)  BEHAVIORAL SYMPTOMS/MOOD NEUROLOGICAL BOWEL NUTRITION STATUS  (none) (none) Incontinent Diet(Regular )  AMBULATORY STATUS COMMUNICATION OF NEEDS Skin   Extensive Assist Verbally Normal                       Personal Care Assistance Level of Assistance  Bathing, Feeding, Dressing Bathing Assistance: Limited assistance Feeding  assistance: Independent Dressing Assistance: Limited assistance     Functional Limitations Info  Sight, Hearing, Speech Sight Info: Adequate Hearing Info: Adequate Speech Info: Adequate    SPECIAL CARE FACTORS FREQUENCY  PT (By licensed PT), OT (By licensed OT)     PT Frequency: 5 OT Frequency: 5            Contractures Contractures Info: Not present    Additional Factors Info  Allergies, Code Status Code Status Info: Full Code  Allergies Info: Carisoprodol, Phenergan Promethazine, Norco Hydrocodone-acetaminophen, Pravastatin           Current Medications (01/26/2018):  This is the current hospital active medication list Current Facility-Administered Medications  Medication Dose Route Frequency Provider Last Rate Last Dose  . acetaminophen (TYLENOL) tablet 650 mg  650 mg Oral Q6H PRN Hillary Bow, MD       Or  . acetaminophen (TYLENOL) suppository 650 mg  650 mg Rectal Q6H PRN Sudini, Srikar, MD      . albuterol (PROVENTIL) (2.5 MG/3ML) 0.083% nebulizer solution 2.5 mg  2.5 mg Nebulization Q2H PRN Sudini, Alveta Heimlich, MD      . ALPRAZolam Duanne Moron) tablet 0.25-0.5 mg  0.25-0.5 mg Oral BID PRN Hillary Bow, MD   0.25 mg at 01/25/18 2032  . feeding supplement (ENSURE ENLIVE) (ENSURE ENLIVE) liquid 237 mL  237 mL Oral BID BM Wieting, Richard, MD      . ferrous sulfate tablet 325 mg  325 mg Oral Daily Hillary Bow, MD   325 mg at 01/26/18 0855  . ibuprofen (  ADVIL,MOTRIN) tablet 400 mg  400 mg Oral Q6H PRN Loletha Grayer, MD   400 mg at 01/26/18 1241  . [START ON 01/27/2018] levofloxacin (LEVAQUIN) IVPB 750 mg  750 mg Intravenous Q48H Sudini, Alveta Heimlich, MD      . MEDLINE mouth rinse  15 mL Mouth Rinse BID Amelia Jo, MD   15 mL at 01/26/18 0857  . metoprolol tartrate (LOPRESSOR) tablet 25 mg  25 mg Oral Daily Hillary Bow, MD   25 mg at 01/26/18 0854  . nitroGLYCERIN (NITROSTAT) SL tablet 0.4 mg  0.4 mg Sublingual Q5 min PRN Sudini, Alveta Heimlich, MD      . ondansetron (ZOFRAN)  tablet 4 mg  4 mg Oral Q6H PRN Sudini, Alveta Heimlich, MD       Or  . ondansetron (ZOFRAN) injection 4 mg  4 mg Intravenous Q6H PRN Sudini, Srikar, MD      . pantoprazole (PROTONIX) EC tablet 40 mg  40 mg Oral QAC breakfast Sudini, Srikar, MD      . polyethylene glycol (MIRALAX / GLYCOLAX) packet 17 g  17 g Oral Daily PRN Sudini, Srikar, MD      . tiZANidine (ZANAFLEX) tablet 1 mg  1 mg Oral Q6H PRN Hillary Bow, MD         Discharge Medications: Please see discharge summary for a list of discharge medications.  Relevant Imaging Results:  Relevant Lab Results:   Additional Information SSn: [id number]  Annamaria Boots, [place]

## 2018-01-26 NOTE — Care Management Note (Signed)
Case Management Note  Patient Details  Name: Colleen Nguyen MRN: [id number] Date of Birth: 06/19/1922  Subjective/Objective:  Admitted to [facility] with the diagnosis of pleural effusion. Lives with daughter [name] [phone]). Sees Dr,. Feldpausch  Followed by [facility] for skilled nursing, occupational therapy, physical therapy, and aide in the home. Discharged from this facility to [facility] 01/07/18. Discharged from [facility] 01/20/18. [facility] in the past.  Followed by Palliative Care in the outpatient setting.                Action/Plan: Will need resumption of Home Health orders per [place], if discharged to home.    Expected Discharge Date:                  Expected Discharge Plan:     In-House Referral:   yes  Discharge planning Services   yes  Post Acute Care Choice:    Choice offered to:     DME Arranged:    DME Agency:     HH Arranged:    HH Agency:     Status of Service:     If discussed at [employer] of Stay Meetings, dates discussed:    Additional Comments:  Shelbie Ammons, RN MSN CCM Care Management [phone] 01/26/2018, 10:50 AM

## 2018-01-26 NOTE — Clinical Social Work Note (Signed)
Clinical Social Work Assessment  Patient Details  Name: Colleen Nguyen MRN: [id number] Date of Birth: 1922-06-18  Date of referral:  01/26/18               Reason for consult:  Facility Placement                Permission sought to share information with:  Case Manager, Customer service manager, Family Supports Permission granted to share information::  Yes, Verbal Permission Granted  Name::      SNF  Agency::   [place]   Relationship::     Contact Information:     Housing/Transportation Living arrangements for the past 2 months:  [place] of Information:  Adult Children Patient Interpreter Needed:  None Criminal Activity/Legal Involvement Pertinent to Current Situation/Hospitalization:  No - Comment as needed Significant Relationships:  Adult Children Lives with:  Adult Children Do you feel safe going back to the place where you live?  Yes Need for family participation in patient care:  Yes (Comment)  Care giving concerns:  Patient lives with daughter    Social Worker assessment / plan:  CSW consulted for SNF placement. CSW found through chart review that patient was recently discharged from [facility]. CSW met with patient and daughter to discuss discharge plan.  Daughter states that patient was recently discharged from [facility] on 01/23/18. Since then she has been living with daughter. CSW explained that SNF has been recommended by PT today. Daughter would like patient to return to [facility] if insurance would pay for it. CSW will initiate bed search and give bed offers once received. CSW will continue to follow for discharge planning.   Employment status:  Retired Nurse, adult PT Recommendations:  [place] / Referral to community resources:     Patient/Family's Response to care:  Patient slept through assessment but daughter thanked CSW for assistance   Patient/Family's Understanding of  and Emotional Response to Diagnosis, Current Treatment, and Prognosis:  Daughter states understanding of current treatment plan   Emotional Assessment Appearance:  Appears stated age Attitude/Demeanor/Rapport:  Unable to Assess Affect (typically observed):  Unable to Assess Orientation:  Oriented to Self Alcohol / Substance use:  Not Applicable Psych involvement (Current and /or in the community):  No (Comment)  Discharge Needs  Concerns to be addressed:  Discharge Planning Concerns Readmission within the last 30 days:  Yes Current discharge risk:  Cognitively Impaired, Dependent with Mobility Barriers to Discharge:  Continued Medical Work up   [employer], [place] 01/26/2018, 4:07 PM

## 2018-01-26 NOTE — Progress Notes (Signed)
Patient ID: Colleen Nguyen, female   DOB: [birth year], 84 y.o.   MRN: [id number]  Chief Complaint  Patient presents with  . Chest Pain    Referred By Dr. Marquis Lunch Reason for Referral left-sided pleural effusion  HPI Location, Quality, Duration, Severity, Timing, Context, Modifying Factors, Associated Signs and Symptoms.  Colleen Nguyen is a 84 y.o. female.  She was recently discharged from the hospital with a diagnosis of left-sided pneumonia and after a few days at home per he presented to the emergency department with a large left-sided pleural effusion.  The history was obtained from the patient and she was a very poor historian so is difficult to know exactly what her symptoms were when she came in.  However she does state that she feels better since her admission.  She does live with her daughter and son-in-law at home.  She states that she used to be very active but now she is essentially bedbound.  She is unable to get out of bed without assistance.  She is unable to perform the activities of daily living without assistance.   Past Medical History:  Diagnosis Date  . Anemia   . Anxiety   . Atrial fibrillation ([place])   . Diastolic CHF ([place])   . Hypertension     Past Surgical History:  Procedure Laterality Date  . ABDOMINAL HYSTERECTOMY    . APPENDECTOMY    . COLONOSCOPY WITH PROPOFOL N/A 07/23/2015   Procedure: COLONOSCOPY WITH PROPOFOL;  Surgeon: Lucilla Lame, MD;  Location: [facility] ENDOSCOPY;  Service: Endoscopy;  Laterality: N/A;  . ESOPHAGOGASTRODUODENOSCOPY (EGD) WITH PROPOFOL N/A 07/23/2015   Procedure: ESOPHAGOGASTRODUODENOSCOPY (EGD) WITH PROPOFOL;  Surgeon: Lucilla Lame, MD;  Location: [facility] ENDOSCOPY;  Service: Endoscopy;  Laterality: N/A;    Family History  Problem Relation Age of Onset  . Leukemia Mother   . Stroke Father   . CAD Brother     Social History Social History   Tobacco Use  . Smoking status: Never Smoker  . Smokeless tobacco: Never Used   Substance Use Topics  . Alcohol use: No  . Drug use: No    Allergies  Allergen Reactions  . Carisoprodol Other (See Comments)    Reaction:  Hypersomnolence   . Phenergan [Promethazine] Other (See Comments)    Reaction:  Hallucinations   . Norco [Hydrocodone-Acetaminophen] Itching and Rash  . Pravastatin Rash    Current Facility-Administered Medications  Medication Dose Route Frequency Provider Last Rate Last Dose  . acetaminophen (TYLENOL) tablet 650 mg  650 mg Oral Q6H PRN Hillary Bow, MD       Or  . acetaminophen (TYLENOL) suppository 650 mg  650 mg Rectal Q6H PRN Sudini, Srikar, MD      . albuterol (PROVENTIL) (2.5 MG/3ML) 0.083% nebulizer solution 2.5 mg  2.5 mg Nebulization Q2H PRN Sudini, Alveta Heimlich, MD      . ALPRAZolam Duanne Moron) tablet 0.25-0.5 mg  0.25-0.5 mg Oral BID PRN Hillary Bow, MD   0.25 mg at 01/25/18 2032  . feeding supplement (ENSURE ENLIVE) (ENSURE ENLIVE) liquid 237 mL  237 mL Oral BID BM Wieting, Richard, MD      . ferrous sulfate tablet 325 mg  325 mg Oral Daily Hillary Bow, MD   325 mg at 01/26/18 0855  . ibuprofen (ADVIL,MOTRIN) tablet 400 mg  400 mg Oral Q6H PRN Loletha Grayer, MD   400 mg at 01/26/18 1241  . [START ON 01/27/2018] levofloxacin (LEVAQUIN) IVPB 750 mg  750 mg Intravenous Q48H Sudini, Srikar,  MD      . MEDLINE mouth rinse  15 mL Mouth Rinse BID Amelia Jo, MD   15 mL at 01/26/18 0857  . metoprolol tartrate (LOPRESSOR) tablet 25 mg  25 mg Oral Daily Hillary Bow, MD   25 mg at 01/26/18 0854  . nitroGLYCERIN (NITROSTAT) SL tablet 0.4 mg  0.4 mg Sublingual Q5 min PRN Sudini, Alveta Heimlich, MD      . ondansetron (ZOFRAN) tablet 4 mg  4 mg Oral Q6H PRN Hillary Bow, MD       Or  . ondansetron (ZOFRAN) injection 4 mg  4 mg Intravenous Q6H PRN Sudini, Srikar, MD      . pantoprazole (PROTONIX) EC tablet 40 mg  40 mg Oral QAC breakfast Sudini, Srikar, MD      . polyethylene glycol (MIRALAX / GLYCOLAX) packet 17 g  17 g Oral Daily PRN Sudini,  Srikar, MD      . tiZANidine (ZANAFLEX) tablet 1 mg  1 mg Oral Q6H PRN Sudini, Alveta Heimlich, MD          Review of Systems A complete review of systems was asked and was negative except for the following positive findings shortness of breath upon admission which is now improved, macular degeneration requiring glasses.  Blood pressure 139/68, pulse 66, temperature 98.3 F (36.8 C), temperature source Oral, resp. rate 20, height 5\' 1"  (1.549 m), weight 39.6 kg, SpO2 95 %.  Physical Exam CONSTITUTIONAL:  Pleasant, very thin and frail looking but in no acute distress. EYES: Pupils equal and reactive to light, Sclera non-icteric EARS, NOSE, MOUTH AND THROAT:  The oropharynx was clear..  Oral mucosa pink and moist. LYMPH NODES:  Lymph nodes in the neck and axillae were normal RESPIRATORY:  Lungs were clear.  Normal respiratory effort without pathologic use of accessory muscles of respiration CARDIOVASCULAR: Heart was regular without murmurs.  There were no carotid bruits. GI: The abdomen was soft, nontender, and nondistended. There were no palpable masses. There was no hepatosplenomegaly. There were normal bowel sounds in all quadrants. GU:  Rectal deferred.   MUSCULOSKELETAL:  Normal muscle strength and tone.  No clubbing or cyanosis.   SKIN:  There were no pathologic skin lesions.  There were no nodules on palpation. NEUROLOGIC:  Sensation is normal.  Cranial nerves are grossly intact. PSYCH:  Oriented to person, place and time.  Mood and affect are normal.  Data Reviewed CT scan and chest x-ray  I have personally reviewed the patient's imaging, laboratory findings and medical records.    Assessment    I did have the opportunity to review with the daughter the most recent x-rays.  There has been some improvement in the left pleural effusion.  Certainly the effusion is much less than it was before her thoracentesis.    Plan    Overall given her very frail condition I do not think that she  would be a candidate for surgical intervention.  If the fluid were to recur and she were symptomatic I think that a percutaneous drain with intrapleural thrombolytics may be the best possible management.  I explained this to the patient and her daughter and they had their questions answered.      Nestor Lewandowsky, MD 01/26/2018, 2:08 PM   Patient ID: Colleen Nguyen, female   DOB: [birth year], 84 y.o.   MRN: [id number]

## 2018-01-26 NOTE — Progress Notes (Signed)
Patient ID: Colleen Nguyen, female   DOB: 07/06/1922, 84 y.o.   MRN: [id number]  [facility] PROGRESS NOTE  Colleen Nguyen WFU:[id number] DOB: 01-02-1923 DOA: 01/25/2018 PCP: Sofie Hartigan, MD  HPI/Subjective: Patient feels very weak.  States her breathing is a little bit better than yesterday.  Had pleural effusion removing 600 mL of cloudy fluid.  Objective: Vitals:   01/26/18 1356 01/26/18 1357  BP:  139/68  Pulse:  66  Resp:    Temp:  98.3 F (36.8 C)  SpO2: 91% 95%    Filed Weights   01/25/18 1132 01/26/18 0224  Weight: 46.7 kg 39.6 kg    ROS: Review of Systems  Constitutional: Negative for chills and fever.  Eyes: Negative for blurred vision.  Respiratory: Positive for cough and shortness of breath.   Cardiovascular: Negative for chest pain.  Gastrointestinal: Negative for abdominal pain, constipation, diarrhea, nausea and vomiting.  Genitourinary: Negative for dysuria.  Musculoskeletal: Negative for joint pain.  Neurological: Negative for dizziness and headaches.   Exam: Physical Exam  HENT:  Nose: No mucosal edema.  Mouth/Throat: No oropharyngeal exudate or posterior oropharyngeal edema.  Eyes: Pupils are equal, round, and reactive to light. Conjunctivae, EOM and lids are normal.  Neck: No JVD present. Carotid bruit is not present. No edema present. No thyroid mass and no thyromegaly present.  Cardiovascular: S1 normal and S2 normal. Exam reveals no gallop.  No murmur heard. Pulses:      Dorsalis pedis pulses are 2+ on the right side, and 2+ on the left side.  Respiratory: No respiratory distress. She has decreased breath sounds in the left middle field and the left lower field. She has no wheezes. She has no rhonchi. She has no rales.  GI: Soft. Bowel sounds are normal. There is no tenderness.  Musculoskeletal:       Right ankle: She exhibits no swelling.       Left ankle: She exhibits no swelling.  Lymphadenopathy:    She has no cervical  adenopathy.  Neurological: She is alert. No cranial nerve deficit.  Skin: Skin is warm. No rash noted. Nails show no clubbing.  Psychiatric: She has a normal mood and affect.      Data Reviewed: Basic Metabolic Panel: Recent Labs  Lab 01/25/18 1136 01/26/18 0546  NA 136 138  K 3.5 3.6  CL 107 107  CO2 21* 23  GLUCOSE 83 69*  BUN 17 14  CREATININE 0.82 0.78  CALCIUM 8.3* 8.1*   Liver Function Tests: Recent Labs  Lab 01/25/18 1136  AST 21  ALT 14  ALKPHOS 77  BILITOT 0.8  PROT 6.6  ALBUMIN 2.2*   CBC: Recent Labs  Lab 01/25/18 1136 01/26/18 0546  WBC 6.6 6.0  NEUTROABS 3.7  --   HGB 9.2* 9.4*  HCT 29.0* 30.0*  MCV 91.8 93.2  PLT 367 381   Cardiac Enzymes: Recent Labs  Lab 01/25/18 1136  TROPONINI <0.03    CBG: Recent Labs  Lab 01/26/18 0758  GLUCAP 77    Recent Results (from the past 240 hour(s))  Body fluid culture     Status: None (Preliminary result)   Collection Time: 01/25/18  4:03 PM  Result Value Ref Range Status   Specimen Description   Final    PLEURAL Performed at [facility], [address], [place], [place] [address]    Special Requests   Final    NONE Performed at [facility], [address] [place],  [place] [address]    Gram Stain NO WBC SEEN NO ORGANISMS SEEN   Final   Culture   Final    NO GROWTH < 24 HOURS Performed at [facility], [place] [address], [place], [place] [address]    Report Status PENDING  Incomplete     Studies: Dg Chest 2 View  Result Date: 01/26/2018 CLINICAL DATA:  History of left pleural effusion with thoracentesis yesterday, follow-up, history of atrial fibrillation EXAM: CHEST - 2 VIEW COMPARISON:  Portable chest x-ray of 01/25/2017 FINDINGS: There is a small left pleural effusion remaining. No pneumothorax is seen. The lungs remain somewhat hyperaerated. Cardiomegaly is stable. No bony abnormality is seen. The bones are somewhat osteopenic. IMPRESSION: 1. Small left  pleural effusion remains.  No pneumothorax. 2. Stable cardiomegaly. Electronically Signed   By: Ivar Drape M.D.   On: 01/26/2018 11:07   Dg Chest 2 View  Result Date: 01/25/2018 CLINICAL DATA:  Cough and weakness EXAM: CHEST - 2 VIEW COMPARISON:  12/31/2017 FINDINGS: Cardiac shadow is enlarged but stable. Aortic calcifications are again seen. The right lung is clear. Increasing left-sided pleural effusion with likely underlying infiltrate or atelectasis is noted. No acute bony abnormality is seen. IMPRESSION: Increasing left-sided pleural effusion with likely underlying infiltrate/atelectasis. Electronically Signed   By: Inez Catalina M.D.   On: 01/25/2018 12:55   Ct Chest W Contrast  Result Date: 01/25/2018 CLINICAL DATA:  Chest pain. EXAM: CT CHEST WITH CONTRAST TECHNIQUE: Multidetector CT imaging of the chest was performed during intravenous contrast administration. CONTRAST:  42mL OMNIPAQUE IOHEXOL 300 MG/ML  SOLN COMPARISON:  Radiographs of same day.  CT scan of June 03, 2008. FINDINGS: Cardiovascular: Atherosclerosis of thoracic aorta is noted without dissection. Focal bulging of the medial margin of the transverse aortic arch is noted which measures 27 x 8 mm. This was not present on prior exam. Mild cardiomegaly is noted. No pericardial effusion is noted. Mediastinum/Nodes: 1.1 cm left thyroid nodule is noted. Esophagus is unremarkable. No significant adenopathy is noted. Lungs/Pleura: No pneumothorax is noted. Large left pleural effusion is noted with associated atelectasis of the left lower lobe. Small right pleural effusion is noted with adjacent subsegmental atelectasis. Upper Abdomen: No acute abnormality. Musculoskeletal: No chest wall abnormality. No acute or significant osseous findings. IMPRESSION: Large left pleural effusion is noted with associated atelectasis of the left lower lobe. Small right pleural effusion is noted with adjacent subsegmental atelectasis. 1.1 cm left thyroid nodule  is noted. Thyroid ultrasound is recommended for further evaluation. Focal bulging of medial margin of transverse aortic arch is noted which measures 27 x 8 mm which was not present on prior exam. Follow-up CT scan in 12 months is recommended to ensure stability. Aortic Atherosclerosis (ICD10-I70.0). Electronically Signed   By: Marijo Conception, M.D.   On: 01/25/2018 14:22   Dg Chest Port 1 View  Result Date: 01/25/2018 CLINICAL DATA:  Status post thoracentesis. EXAM: PORTABLE CHEST 1 VIEW COMPARISON:  Radiographs of January 25, 2018. FINDINGS: Stable cardiomediastinal silhouette. No pneumothorax is noted. Right lung is clear. Left pleural effusion is significantly smaller status post thoracentesis. Residual atelectasis is noted in left lung base. Degenerative changes seen involving the left glenohumeral joint. IMPRESSION: Left pleural effusion is significantly smaller status post thoracentesis. No pneumothorax is noted. Electronically Signed   By: Marijo Conception, M.D.   On: 01/25/2018 16:19   US Thoracentesis Asp Pleural Space W/img Guide  Result Date: 01/25/2018 INDICATION: Left pleural effusion EXAM:  ULTRASOUND GUIDED LEFT THORACENTESIS MEDICATIONS: None. COMPLICATIONS: None immediate. PROCEDURE: An ultrasound guided thoracentesis was thoroughly discussed with the patient and questions answered. The benefits, risks, alternatives and complications were also discussed. The patient understands and wishes to proceed with the procedure. Written consent was obtained. Ultrasound was performed to localize and mark an adequate pocket of fluid in the left chest. The area was then prepped and draped in the normal sterile fashion. 1% Lidocaine was used for local anesthesia. Under ultrasound guidance a 6 Fr Safe-T-Centesis catheter was introduced. Thoracentesis was performed. The catheter was removed and a dressing applied. FINDINGS: A total of approximately 650 cc of cloudy yellow fluid was removed. Samples were  sent to the laboratory as requested by the clinical team. IMPRESSION: Successful ultrasound guided left thoracentesis yielding 650 cc of pleural fluid. Electronically Signed   By: Marybelle Killings M.D.   On: 01/25/2018 16:23    Scheduled Meds: . feeding supplement (ENSURE ENLIVE)  237 mL Oral BID BM  . ferrous sulfate  325 mg Oral Daily  . mouth rinse  15 mL Mouth Rinse BID  . metoprolol tartrate  25 mg Oral Daily  . pantoprazole  40 mg Oral QAC breakfast   Continuous Infusions: . [START ON 01/27/2018] levofloxacin (LEVAQUIN) IV      Assessment/Plan:  1. Large pleural effusion with recent pneumonia.  So far culture is negative.  Quite a bit of white blood cells and elevated LDH.  Decreased breath sounds in the left base.  Appreciate consults by Dr. Genevive Bi cardiothoracic surgery.  Repeat chest x-ray only showing a small effusion.  Continue IV Levaquin at this point.  Looks like patient was treated last time with Rocephin and Zithromax and sent home on Cefdinir. 2. Hypertension on metoprolol 3. History of atrial fibrillation on metoprolol 4. GERD on Protonix 5. Weakness.  Physical therapy recommends rehab.  On previous discharge was discharged home.  Code Status:     Code Status Orders  (From admission, onward)         Start     Ordered   01/25/18 1515  Full code  Continuous     01/25/18 1515        Code Status History    Date Active Date Inactive Code Status Order ID Comments User Context   12/31/2017 1259 01/07/2018 1707 Full Code [id number]  Hillary Bow, MD ED   02/05/2017 2044 02/06/2017 1741 Full Code [id number]  Henreitta Leber, MD Inpatient   09/30/2015 0614 10/02/2015 1521 Full Code [id number]  Harrie Foreman, MD Inpatient   07/24/2015 1800 07/25/2015 1808 Full Code [id number]  Epifanio Lesches, MD ED   07/22/2015 1622 07/24/2015 1521 Full Code [id number]  Idelle Crouch, MD Inpatient     Family Communication: Daughter at the bedside Disposition Plan: To be determined  based on clinical course  Consultants:  Cardiothoracic surgery  Procedures:  Thoracentesis  Antibiotics:  Levaquin  Time spent: 28 minutes  [place]

## 2018-01-26 NOTE — Progress Notes (Signed)
Hypoglycemic Event  CBG: 69 (from labs)  Treatment: 4 oz orange juice given  Symptoms: asymptomatic   Follow-up CBG: Time: gave in report to oncoming nurse Megan S.   Comments/MD Jodell Cipro  notified:    Wallace Going

## 2018-01-26 NOTE — Evaluation (Signed)
Physical Therapy Evaluation Patient Details Name: Jayleana Colberg MRN: [id number] DOB: 05-20-1922 Today's Date: 01/26/2018   History of Present Illness  Patient is a 84 year old female admitted for pneumonia following recent discharge from SNF. PMH includes recent non-ST elevation MI on 12/31/2017.  PMH includes a-fib, Htn, anxiety and anemia.  Clinical Impression  Pt is a 84 year old female who has recently discharged to her daughter's house from SNF.  Pt has recently become very ill and is now dependent in transfers and for ADL's.  Pt required mod-max A for bed mobility and VC's for upright posture at EOB.  Pt experienced O2 desat while sitting and was required to return to use of 1L of O2.  STS transfer deferred for this reason.  Pt also visibly very fatigued and with labored breathing and increased effort to speak.  Pt presented with overall weakness of UE/LE's and appeared to be in pain when supine to sit initiated.  Pt will benefit from skilled PT with focus on strength, tolerance to activity and safe functional mobility.    Follow Up Recommendations SNF    Equipment Recommendations  None recommended by PT    Recommendations for Other Services       Precautions / Restrictions Precautions Precautions: Fall Restrictions Weight Bearing Restrictions: No      Mobility  Bed Mobility Overal bed mobility: Needs Assistance Bed Mobility: Supine to Sit;Sit to Supine     Supine to sit: Mod assist Sit to supine: Max assist   General bed mobility comments: Required assistance to initiate sup to sit and to return to bed.  Pt very fatigued upon return to bed and pt and MD scooted pt up in bed.  STS transfer deferred due to pt fatigue and O2 desat.  PT provided VC's for pt to maintain upright posture as pt continued to lean to the L side.  Transfers                    Ambulation/Gait             General Gait Details: unsafe/unable  Stairs            Wheelchair  Mobility    Modified Rankin (Stroke Patients Only)       Balance Overall balance assessment: Needs assistance Sitting-balance support: Bilateral upper extremity supported;Feet supported Sitting balance-Leahy Scale: Poor                                       Pertinent Vitals/Pain Pain Assessment: Faces Faces Pain Scale: Hurts a little bit Pain Location: Pt showed some signs of pain during initiation of supine to sit. Pain Descriptors / Indicators: Grimacing Pain Intervention(s): Monitored during session    Home Living Family/patient expects to be discharged to:: Skilled nursing facility Living Arrangements: Children(Lives with daughter and son-in-law, who is at work during the day.  Pt's daughter is not physically able to transfer daughter or assist with bathing.) Available Help at Discharge: Family;Available PRN/intermittently Type of Home: House         Home Equipment: Walker - 2 wheels      Prior Function Level of Independence: Needs assistance   Gait / Transfers Assistance Needed: Pt has been transferring with assistance since last hospitalization.  ADL's / Homemaking Assistance Needed: Pt's last bath was prior to discharge from SNF.        Hand Dominance  Extremity/Trunk Assessment   Upper Extremity Assessment Upper Extremity Assessment: Generalized weakness(Grossly 3+/5 bilaterally.)    Lower Extremity Assessment Lower Extremity Assessment: Generalized weakness(Grossly 3+/5 bilaterally.)    Cervical / Trunk Assessment Cervical / Trunk Assessment: Kyphotic  Communication   Communication: No difficulties  Cognition Arousal/Alertness: Awake/alert Behavior During Therapy: WFL for tasks assessed/performed Overall Cognitive Status: Within Functional Limits for tasks assessed                                 General Comments: Able to follow commands consistently.  Oriented to name and situation.      General  Comments      Exercises Other Exercises Other Exercises: Time taken to monitor pt's O2 and resume pt on 1L of O2; education regarding O2 use.  x5 min Other Exercises: Education regarding bed mobility for energy conservation x3 min Other Exercises: Education regarding SNF setting and PT following DC x2 min   Assessment/Plan    PT Assessment Patient needs continued PT services  PT Problem List Decreased strength;Decreased mobility;Decreased balance;Decreased knowledge of use of DME;Decreased activity tolerance       PT Treatment Interventions DME instruction;Therapeutic activities;Gait training;Patient/family education;Therapeutic exercise;Balance training;Functional mobility training    PT Goals (Current goals can be found in the Care Plan section)  Acute Rehab PT Goals Patient Stated Goal: To regain strength and work with PT. PT Goal Formulation: With patient/family Time For Goal Achievement: 02/09/18 Potential to Achieve Goals: Fair    Frequency Min 2X/week   Barriers to discharge        Co-evaluation               AM-PAC PT "6 Clicks" Daily Activity  Outcome Measure Difficulty turning over in bed (including adjusting bedclothes, sheets and blankets)?: A Lot Difficulty moving from lying on back to sitting on the side of the bed? : Unable Difficulty sitting down on and standing up from a chair with arms (e.g., wheelchair, bedside commode, etc,.)?: Unable Help needed moving to and from a bed to chair (including a wheelchair)?: Total Help needed walking in hospital room?: Total Help needed climbing 3-5 steps with a railing? : Total 6 Click Score: 7    End of Session Equipment Utilized During Treatment: Oxygen Activity Tolerance: Patient limited by fatigue;Patient limited by pain Patient left: in chair;with call bell/phone within reach;with nursing/sitter in room;in bed;with bed alarm set Nurse Communication: Mobility status PT Visit Diagnosis: Muscle weakness  (generalized) (M62.81);Difficulty in walking, not elsewhere classified (R26.2)    Time: 4076-8088 PT Time Calculation (min) (ACUTE ONLY): 24 min   Charges:   PT Evaluation $PT Eval Low Complexity: 1 Low PT Treatments $Therapeutic Activity: 8-22 mins        Roxanne Gates, PT, DPT   Roxanne Gates 01/26/2018, 2:08 PM

## 2018-01-27 MED ORDER — LEVOFLOXACIN 750 MG PO TABS: 750.0000 mg | ORAL_TABLET | ORAL | Status: DC

## 2018-01-27 NOTE — Clinical Social Work Note (Signed)
CSW met with patient and daughter at bedside to give bed offers. Daughter chose [facility]. CSW notified Tina at [facility] of bed acceptance. Otila Kluver will begin [facility] authorization. CSW will continue to follow for discharge planning.   [place], [place]

## 2018-01-27 NOTE — Progress Notes (Signed)
Patient ID: Colleen Nguyen, female   DOB: [birth year], 84 y.o.   MRN: [id number]  [facility] PROGRESS NOTE  Charmane Protzman SPQ:[id number] DOB: 1922/06/07 DOA: 01/25/2018 PCP: Sofie Hartigan, MD  HPI/Subjective: Patient feeling better.  Offers no complaints.  States her breathing is better.  Feeling very weak.  Objective: Vitals:   01/27/18 1401 01/27/18 1509  BP:  132/60  Pulse:  64  Resp:  (!) 24  Temp:  97.8 F (36.6 C)  SpO2: 97% 99%    Filed Weights   01/25/18 1132 01/26/18 0224 01/27/18 0342  Weight: 46.7 kg 39.6 kg 41 kg    ROS: Review of Systems  Constitutional: Positive for malaise/fatigue. Negative for chills and fever.  Eyes: Negative for blurred vision.  Respiratory: Negative for cough and shortness of breath.   Cardiovascular: Negative for chest pain.  Gastrointestinal: Negative for abdominal pain, constipation, diarrhea, nausea and vomiting.  Genitourinary: Negative for dysuria.  Musculoskeletal: Negative for joint pain.  Neurological: Negative for dizziness and headaches.   Exam: Physical Exam  HENT:  Nose: No mucosal edema.  Mouth/Throat: No oropharyngeal exudate or posterior oropharyngeal edema.  Eyes: Pupils are equal, round, and reactive to light. Conjunctivae, EOM and lids are normal.  Neck: No JVD present. Carotid bruit is not present. No edema present. No thyroid mass and no thyromegaly present.  Cardiovascular: S1 normal and S2 normal. Exam reveals no gallop.  No murmur heard. Pulses:      Dorsalis pedis pulses are 2+ on the right side, and 2+ on the left side.  Respiratory: No respiratory distress. She has decreased breath sounds in the left lower field. She has no wheezes. She has no rhonchi. She has no rales.  GI: Soft. Bowel sounds are normal. There is no tenderness.  Musculoskeletal:       Right ankle: She exhibits no swelling.       Left ankle: She exhibits no swelling.  Lymphadenopathy:    She has no cervical adenopathy.   Neurological: She is alert. No cranial nerve deficit.  Skin: Skin is warm. No rash noted. Nails show no clubbing.  Psychiatric: She has a normal mood and affect.      Data Reviewed: Basic Metabolic Panel: Recent Labs  Lab 01/25/18 1136 01/26/18 0546  NA 136 138  K 3.5 3.6  CL 107 107  CO2 21* 23  GLUCOSE 83 69*  BUN 17 14  CREATININE 0.82 0.78  CALCIUM 8.3* 8.1*   Liver Function Tests: Recent Labs  Lab 01/25/18 1136  AST 21  ALT 14  ALKPHOS 77  BILITOT 0.8  PROT 6.6  ALBUMIN 2.2*   CBC: Recent Labs  Lab 01/25/18 1136 01/26/18 0546  WBC 6.6 6.0  NEUTROABS 3.7  --   HGB 9.2* 9.4*  HCT 29.0* 30.0*  MCV 91.8 93.2  PLT 367 381   Cardiac Enzymes: Recent Labs  Lab 01/25/18 1136  TROPONINI <0.03    CBG: Recent Labs  Lab 01/26/18 0758  GLUCAP 77    Recent Results (from the past 240 hour(s))  Urine Culture     Status: Abnormal (Preliminary result)   Collection Time: 01/25/18 11:36 AM  Result Value Ref Range Status   Specimen Description   Final    URINE, CLEAN CATCH Performed at [facility], [address], [place], [place] [address]    Special Requests   Final    Normal Performed at [facility], [address] [place], [place] [address]  Culture 50,000 COLONIES/mL KLEBSIELLA OXYTOCA (A)  Final   Report Status PENDING  Incomplete  Body fluid culture     Status: None (Preliminary result)   Collection Time: 01/25/18  4:03 PM  Result Value Ref Range Status   Specimen Description   Final    PLEURAL Performed at [facility], [address], [place], [place] [address]    Special Requests   Final    NONE Performed at [facility], [place], [place] [address]    Gram Stain NO WBC SEEN NO ORGANISMS SEEN   Final   Culture   Final    NO GROWTH 2 DAYS Performed at [facility], [place] [address] [place], [place] [address]    Report Status PENDING  Incomplete     Studies: Dg  Chest 2 View  Result Date: 01/26/2018 CLINICAL DATA:  History of left pleural effusion with thoracentesis yesterday, follow-up, history of atrial fibrillation EXAM: CHEST - 2 VIEW COMPARISON:  Portable chest x-ray of 01/25/2017 FINDINGS: There is a small left pleural effusion remaining. No pneumothorax is seen. The lungs remain somewhat hyperaerated. Cardiomegaly is stable. No bony abnormality is seen. The bones are somewhat osteopenic. IMPRESSION: 1. Small left pleural effusion remains.  No pneumothorax. 2. Stable cardiomegaly. Electronically Signed   By: Ivar Drape M.D.   On: 01/26/2018 11:07    Scheduled Meds: . albuterol  2.5 mg Nebulization Q6H  . feeding supplement (ENSURE ENLIVE)  237 mL Oral BID BM  . ferrous sulfate  325 mg Oral Daily  . [START ON 01/29/2018] levofloxacin  750 mg Oral Q48H  . mouth rinse  15 mL Mouth Rinse BID  . metoprolol tartrate  25 mg Oral Daily  . pantoprazole  40 mg Oral QAC breakfast    Assessment/Plan:  1. Large pleural effusion with recent pneumonia.  So far cultures are negative.  Quite a bit of white blood cells and elevated LDH with thoracentesis and so far the culture is negative.  IV Levaquin today and switch over to p.o. Levaquin for tomorrow.  Better air entry today with nebulizer treatments.  Incentive spirometer. 2. Hypertension on metoprolol 3. History of atrial fibrillation on metoprolol 4. GERD on Protonix 5. Weakness.  Physical therapy recommends rehab.  On previous discharge was discharged to rehab and just sent home.  Code Status:     Code Status Orders  (From admission, onward)         Start     Ordered   01/25/18 1515  Full code  Continuous     01/25/18 1515        Code Status History    Date Active Date Inactive Code Status Order ID Comments User Context   12/31/2017 1259 01/07/2018 1707 Full Code [id number]  Hillary Bow, MD ED   02/05/2017 2044 02/06/2017 1741 Full Code [id number]  Henreitta Leber, MD Inpatient   09/30/2015  0614 10/02/2015 1521 Full Code [id number]  Harrie Foreman, MD Inpatient   07/24/2015 1800 07/25/2015 1808 Full Code [id number]  Epifanio Lesches, MD ED   07/22/2015 1622 07/24/2015 1521 Full Code [id number]  Idelle Crouch, MD Inpatient     Family Communication: Daughter at the bedside Disposition Plan: Rehab once insurance authorization obtained  Consultants:  Cardiothoracic surgery  Procedures:  Thoracentesis  Antibiotics:  Levaquin  Time spent: 25 minutes  [place]

## 2018-01-28 DIAGNOSIS — R498 Other voice and resonance disorders: Secondary | ICD-10-CM | POA: Diagnosis not present

## 2018-01-28 DIAGNOSIS — R531 Weakness: Secondary | ICD-10-CM | POA: Diagnosis not present

## 2018-01-28 DIAGNOSIS — I4891 Unspecified atrial fibrillation: Secondary | ICD-10-CM | POA: Diagnosis not present

## 2018-01-28 DIAGNOSIS — J9601 Acute respiratory failure with hypoxia: Secondary | ICD-10-CM | POA: Diagnosis not present

## 2018-01-28 DIAGNOSIS — I110 Hypertensive heart disease with heart failure: Secondary | ICD-10-CM | POA: Diagnosis not present

## 2018-01-28 DIAGNOSIS — M6281 Muscle weakness (generalized): Secondary | ICD-10-CM | POA: Diagnosis not present

## 2018-01-28 DIAGNOSIS — I10 Essential (primary) hypertension: Secondary | ICD-10-CM | POA: Diagnosis not present

## 2018-01-28 DIAGNOSIS — D508 Other iron deficiency anemias: Secondary | ICD-10-CM | POA: Diagnosis not present

## 2018-01-28 DIAGNOSIS — Z515 Encounter for palliative care: Secondary | ICD-10-CM | POA: Diagnosis not present

## 2018-01-28 DIAGNOSIS — J159 Unspecified bacterial pneumonia: Secondary | ICD-10-CM | POA: Diagnosis not present

## 2018-01-28 DIAGNOSIS — I214 Non-ST elevation (NSTEMI) myocardial infarction: Secondary | ICD-10-CM | POA: Diagnosis not present

## 2018-01-28 DIAGNOSIS — R0602 Shortness of breath: Secondary | ICD-10-CM | POA: Diagnosis not present

## 2018-01-28 DIAGNOSIS — F418 Other specified anxiety disorders: Secondary | ICD-10-CM | POA: Diagnosis not present

## 2018-01-28 DIAGNOSIS — I480 Paroxysmal atrial fibrillation: Secondary | ICD-10-CM | POA: Diagnosis not present

## 2018-01-28 DIAGNOSIS — J918 Pleural effusion in other conditions classified elsewhere: Secondary | ICD-10-CM | POA: Diagnosis not present

## 2018-01-28 DIAGNOSIS — J189 Pneumonia, unspecified organism: Secondary | ICD-10-CM | POA: Diagnosis not present

## 2018-01-28 DIAGNOSIS — D649 Anemia, unspecified: Secondary | ICD-10-CM | POA: Diagnosis not present

## 2018-01-28 DIAGNOSIS — R609 Edema, unspecified: Secondary | ICD-10-CM | POA: Diagnosis not present

## 2018-01-28 DIAGNOSIS — F419 Anxiety disorder, unspecified: Secondary | ICD-10-CM | POA: Diagnosis not present

## 2018-01-28 DIAGNOSIS — R262 Difficulty in walking, not elsewhere classified: Secondary | ICD-10-CM | POA: Diagnosis not present

## 2018-01-28 DIAGNOSIS — I447 Left bundle-branch block, unspecified: Secondary | ICD-10-CM | POA: Diagnosis not present

## 2018-01-28 DIAGNOSIS — M25512 Pain in left shoulder: Secondary | ICD-10-CM | POA: Diagnosis not present

## 2018-01-28 DIAGNOSIS — R2681 Unsteadiness on feet: Secondary | ICD-10-CM | POA: Diagnosis not present

## 2018-01-28 DIAGNOSIS — R1312 Dysphagia, oropharyngeal phase: Secondary | ICD-10-CM | POA: Diagnosis not present

## 2018-01-28 DIAGNOSIS — J90 Pleural effusion, not elsewhere classified: Secondary | ICD-10-CM | POA: Diagnosis not present

## 2018-01-28 DIAGNOSIS — I5032 Chronic diastolic (congestive) heart failure: Secondary | ICD-10-CM | POA: Diagnosis not present

## 2018-01-28 DIAGNOSIS — F064 Anxiety disorder due to known physiological condition: Secondary | ICD-10-CM | POA: Diagnosis not present

## 2018-01-28 DIAGNOSIS — Z743 Need for continuous supervision: Secondary | ICD-10-CM | POA: Diagnosis not present

## 2018-01-28 LAB — URINE CULTURE
Culture: 50000 — AB
Special Requests: NORMAL

## 2018-01-28 MED ORDER — ENSURE ENLIVE PO LIQD: 237.0000 mL | Freq: Two times a day (BID) | ORAL | 12 refills | Status: AC

## 2018-01-28 MED ORDER — ALPRAZOLAM 0.5 MG PO TABS: 0.5000 mg | ORAL_TABLET | Freq: Every day | ORAL | 0 refills | Status: AC

## 2018-01-28 MED ORDER — TIZANIDINE HCL 2 MG PO TABS: 1.0000 mg | ORAL_TABLET | Freq: Four times a day (QID) | ORAL | 0 refills | Status: AC | PRN

## 2018-01-28 MED ORDER — LEVOFLOXACIN 750 MG PO TABS: 750.0000 mg | ORAL_TABLET | ORAL | 0 refills | Status: AC

## 2018-01-28 NOTE — Progress Notes (Addendum)
Patient is currently followed by outpatient Palliative, was at [facility],then home. Patient to discharge today to [facility] today with plan for continued outpatient palliative, CSW Annamaria Boots aware. Updated notes faxed to referral. Flo Shanks RN, BSN, [place] [facility] [place], hospital Liaison [phone]

## 2018-01-28 NOTE — Care Management Important Message (Signed)
Important Message  Patient Details  Name: Colleen Nguyen MRN: [id number] Date of Birth: [birth year]   Medicare Important Message Given:  Yes    [name] 01/28/2018, 10:47 AM

## 2018-01-28 NOTE — Clinical Social Work Note (Signed)
Patient is medically ready for discharge today. CSW notified patient and daughter at bedside that patient would discharge to [facility] today. Patient and daughter are in agreement. Patient will be transported by EMS. RN to call report and call for transport.   [place], [place]

## 2018-01-28 NOTE — Progress Notes (Signed)
Advance care planning  Purpose of Encounter Pneumonia, code status discussion  Parties in Attendance Patient and daughter  Patients Decisional capacity Alert and awake. Able to make decisions  Discussed with patient and daughter. Patients present code status is FULL CODE.  She has been told in the past she would not tolerate CPR well. She tells me she is 53 and after discussing regarding CPR/intubation/defbrillation she has decides to change code status to DNR/DNI. Daughter is HCPOA and is at bedside.  Code status changed and DNR orders entered  Time spent - 18 minutes

## 2018-01-28 NOTE — Progress Notes (Signed)
Report called to [facility] in preparation for transfer of care to facility. Report provided to Elmyra Ricks, assigned RN, All questions addressed at conclusion of report. Non emergent transpot requested by EMS.

## 2018-01-28 NOTE — Discharge Summary (Addendum)
[facility] [place] NAME: Colleen Nguyen    MR#:  [id number]  DATE OF BIRTH:  Nov 07, 1922  DATE OF ADMISSION:  01/25/2018 ADMITTING PHYSICIAN: Hillary Bow, MD  DATE OF DISCHARGE: 01/28/2018  PRIMARY CARE PHYSICIAN: Sofie Hartigan, MD   ADMISSION DIAGNOSIS:  Pleural effusion [J90] S/P thoracentesis [Z98.890] Nonspecific chest pain [R07.9]  DISCHARGE DIAGNOSIS:  Active Problems:   Pleural effusion on left   SECONDARY DIAGNOSIS:   Past Medical History:  Diagnosis Date  . Anemia   . Anxiety   . Atrial fibrillation ([place])   . Diastolic CHF ([place])   . Hypertension      ADMITTING HISTORY  HISTORY OF PRESENT ILLNESS:  Colleen Nguyen  is a 84 y.o. female with a known history of atrial fibrillation, hypertension, diastolic CHF, anxiety presents to the emergency room due to left-sided chest pain.  By the time she arrived in the emergency room her chest pain had resolved.  Chest x-ray shows a large left pleural effusion.  Patient was recently treated for left-sided pneumonia.  During that admission she had elevated troponin which was thought to be demand ischemia and not MI.  Recent echocardiogram with normal ejection fraction. Patient is on 2 L oxygen.   HOSPITAL COURSE:   * Large left pleural effusion, exudate. Thoracentesis with 600 ml cloudy fluid. Cx negative Seen by Dr. Genevive Bi and no surgeries or procedures needed. Started on IV levaquin due to recent pneumonia. Will change to PO for 6 more days. Afebrile. Not needing O2. No mass on CT  * HTN ON metoprolol. Stopped amlodipine  * pAfib  On metoprolol  Weakness Needs SNF  Palliative to follow at SNF  CONSULTS OBTAINED:  Treatment Team:  Nestor Lewandowsky, MD  DRUG ALLERGIES:   Allergies  Allergen Reactions  . Carisoprodol Other (See Comments)    Reaction:  Hypersomnolence   . Phenergan [Promethazine] Other (See Comments)    Reaction:  Hallucinations   . Norco  [Hydrocodone-Acetaminophen] Itching and Rash  . Pravastatin Rash    DISCHARGE MEDICATIONS:   Allergies as of 01/28/2018      Reactions   Carisoprodol Other (See Comments)   Reaction:  Hypersomnolence    Phenergan [promethazine] Other (See Comments)   Reaction:  Hallucinations    Norco [hydrocodone-acetaminophen] Itching, Rash   Pravastatin Rash      Medication List    STOP taking these medications   amLODipine 5 MG tablet Commonly known as:  NORVASC   cefdinir 300 MG capsule Commonly known as:  OMNICEF     TAKE these medications   albuterol (2.5 MG/3ML) 0.083% nebulizer solution Commonly known as:  PROVENTIL Take 3 mLs (2.5 mg total) by nebulization every 4 (four) hours as needed for wheezing or shortness of breath.   ALPRAZolam 0.5 MG tablet Commonly known as:  XANAX Take 1 tablet (0.5 mg total) by mouth at bedtime.   alum & mag hydroxide-simeth 200-200-20 MG/5ML suspension Commonly known as:  MAALOX/MYLANTA Take 15 mLs by mouth every 4 (four) hours as needed for indigestion or heartburn.   feeding supplement (ENSURE ENLIVE) Liqd Take 237 mLs by mouth 2 (two) times daily between meals.   ferrous sulfate 325 (65 FE) MG tablet Take 325 mg by mouth daily.   guaiFENesin-dextromethorphan 100-10 MG/5ML syrup Commonly known as:  ROBITUSSIN DM Take 5 mLs by mouth every 4 (four) hours as needed for cough.   levofloxacin 750 MG tablet Commonly known as:  LEVAQUIN Take  1 tablet (750 mg total) by mouth every other day. Start taking on:  01/29/2018   metoprolol tartrate 25 MG tablet Commonly known as:  LOPRESSOR Take 25 mg by mouth daily.   nitroGLYCERIN 0.4 MG SL tablet Commonly known as:  NITROSTAT Place 1 tablet (0.4 mg total) under the tongue every 5 (five) minutes as needed for chest pain.   omeprazole 20 MG capsule Commonly known as:  PRILOSEC Take 20 mg by mouth daily.   tiZANidine 2 MG tablet Commonly known as:  ZANAFLEX Take 0.5 tablets (1 mg total) by  mouth every 6 (six) hours as needed for muscle spasms. What changed:  when to take this       Today   VITAL SIGNS:  Blood pressure 120/63, pulse 63, temperature 97.9 F (36.6 C), temperature source Oral, resp. rate 18, height 5\' 1"  (1.549 m), weight 40 kg, SpO2 96 %.  I/O:    Intake/Output Summary (Last 24 hours) at 01/28/2018 1025 Last data filed at 01/27/2018 2000 Gross per 24 hour  Intake 220 ml  Output -  Net 220 ml    PHYSICAL EXAMINATION:  Physical Exam  GENERAL:  84 y.o.-year-old patient lying in the bed with no acute distress.  LUNGS: Normal breath sounds bilaterally, no wheezing, rales,rhonchi or crepitation. No use of accessory muscles of respiration.  CARDIOVASCULAR: S1, S2 normal. No murmurs, rubs, or gallops.  ABDOMEN: Soft, non-tender, non-distended. Bowel sounds present. No organomegaly or mass.  NEUROLOGIC: Moves all 4 extremities. PSYCHIATRIC: The patient is alert and oriented x 3.  SKIN: No obvious rash, lesion, or ulcer.   DATA REVIEW:   CBC Recent Labs  Lab 01/26/18 0546  WBC 6.0  HGB 9.4*  HCT 30.0*  PLT 381    Chemistries  Recent Labs  Lab 01/25/18 1136 01/26/18 0546  NA 136 138  K 3.5 3.6  CL 107 107  CO2 21* 23  GLUCOSE 83 69*  BUN 17 14  CREATININE 0.82 0.78  CALCIUM 8.3* 8.1*  AST 21  --   ALT 14  --   ALKPHOS 77  --   BILITOT 0.8  --     Cardiac Enzymes Recent Labs  Lab 01/25/18 1136  TROPONINI <0.03    Microbiology Results  Results for orders placed or performed during the hospital encounter of 01/25/18  Urine Culture     Status: Abnormal (Preliminary result)   Collection Time: 01/25/18 11:36 AM  Result Value Ref Range Status   Specimen Description   Final    URINE, CLEAN CATCH Performed at [facility], [address], [place], [place] [address]    Special Requests   Final    Normal Performed at [facility], [address] [place], [place] [address]    Culture 50,000 COLONIES/mL  KLEBSIELLA OXYTOCA (A)  Final   Report Status PENDING  Incomplete  Body fluid culture     Status: None (Preliminary result)   Collection Time: 01/25/18  4:03 PM  Result Value Ref Range Status   Specimen Description   Final    PLEURAL Performed at [facility], [address], [place], [place] [address]    Special Requests   Final    NONE Performed at [facility], [address] [place], [place] [address]    Gram Stain NO WBC SEEN NO ORGANISMS SEEN   Final   Culture   Final    NO GROWTH 2 DAYS Performed at [facility], [place]  [address] [place], [place] [address]    Report Status PENDING  Incomplete    RADIOLOGY:  Dg Chest 2 View  Result Date: 01/26/2018 CLINICAL DATA:  History of left pleural effusion with thoracentesis yesterday, follow-up, history of atrial fibrillation EXAM: CHEST - 2 VIEW COMPARISON:  Portable chest x-ray of 01/25/2017 FINDINGS: There is a small left pleural effusion remaining. No pneumothorax is seen. The lungs remain somewhat hyperaerated. Cardiomegaly is stable. No bony abnormality is seen. The bones are somewhat osteopenic. IMPRESSION: 1. Small left pleural effusion remains.  No pneumothorax. 2. Stable cardiomegaly. Electronically Signed   By: Ivar Drape M.D.   On: 01/26/2018 11:07    Follow up with PCP in 1 week.  Management plans discussed with the patient, family and they are in agreement.  CODE STATUS:     Code Status Orders  (From admission, onward)         Start     Ordered   01/28/18 1015  Do not attempt resuscitation (DNR)  Continuous    Question Answer Comment  In the event of cardiac or respiratory ARREST Do not call a "code blue"   In the event of cardiac or respiratory ARREST Do not perform Intubation, CPR, defibrillation or ACLS   In the event of cardiac or respiratory ARREST Use medication by any route, position, wound care, and other measures to relive pain and suffering. May use oxygen, suction and  manual treatment of airway obstruction as needed for comfort.      01/28/18 1014        Code Status History    Date Active Date Inactive Code Status Order ID Comments User Context   01/25/2018 1515 01/28/2018 1014 Full Code [id number]  Hillary Bow, MD ED   12/31/2017 1259 01/07/2018 1707 Full Code [id number]  Hillary Bow, MD ED   02/05/2017 2044 02/06/2017 1741 Full Code [id number]  Henreitta Leber, MD Inpatient   09/30/2015 0614 10/02/2015 1521 Full Code [id number]  Harrie Foreman, MD Inpatient   07/24/2015 1800 07/25/2015 1808 Full Code [id number]  Epifanio Lesches, MD ED   07/22/2015 1622 07/24/2015 1521 Full Code [id number]  Idelle Crouch, MD Inpatient      TOTAL TIME TAKING CARE OF THIS PATIENT ON DAY OF DISCHARGE: more than 30 minutes.   Leia Alf Kmari Halter M.D on 01/28/2018 at 10:25 AM  Between 7am to 6pm - Pager - [phone]  After 6pm go to [email or web address] - password [email or web address] [facility]  Office  [phone]  CC: Primary care physician; Sofie Hartigan, MD  Note: This dictation was prepared with Dragon dictation along with smaller phrase technology. Any transcriptional errors that result from this process are unintentional.

## 2018-01-29 LAB — BODY FLUID CULTURE
CULTURE: NO GROWTH
GRAM STAIN: NONE SEEN

## 2018-01-31 DIAGNOSIS — J918 Pleural effusion in other conditions classified elsewhere: Secondary | ICD-10-CM | POA: Diagnosis not present

## 2018-01-31 DIAGNOSIS — I10 Essential (primary) hypertension: Secondary | ICD-10-CM | POA: Diagnosis not present

## 2018-01-31 DIAGNOSIS — F418 Other specified anxiety disorders: Secondary | ICD-10-CM | POA: Diagnosis not present

## 2018-01-31 DIAGNOSIS — I4891 Unspecified atrial fibrillation: Secondary | ICD-10-CM | POA: Diagnosis not present

## 2018-01-31 DIAGNOSIS — M25512 Pain in left shoulder: Secondary | ICD-10-CM | POA: Diagnosis not present

## 2018-01-31 DIAGNOSIS — J189 Pneumonia, unspecified organism: Secondary | ICD-10-CM | POA: Diagnosis not present

## 2018-02-01 DIAGNOSIS — J9601 Acute respiratory failure with hypoxia: Secondary | ICD-10-CM | POA: Diagnosis not present

## 2018-02-01 DIAGNOSIS — Z515 Encounter for palliative care: Secondary | ICD-10-CM | POA: Diagnosis not present

## 2018-02-02 DIAGNOSIS — I5032 Chronic diastolic (congestive) heart failure: Secondary | ICD-10-CM | POA: Diagnosis not present

## 2018-02-02 DIAGNOSIS — I447 Left bundle-branch block, unspecified: Secondary | ICD-10-CM | POA: Diagnosis not present

## 2018-02-02 DIAGNOSIS — D649 Anemia, unspecified: Secondary | ICD-10-CM | POA: Diagnosis not present

## 2018-02-02 DIAGNOSIS — I110 Hypertensive heart disease with heart failure: Secondary | ICD-10-CM | POA: Diagnosis not present

## 2018-02-02 DIAGNOSIS — I480 Paroxysmal atrial fibrillation: Secondary | ICD-10-CM | POA: Diagnosis not present

## 2018-02-02 DIAGNOSIS — M6281 Muscle weakness (generalized): Secondary | ICD-10-CM | POA: Diagnosis not present

## 2018-02-02 DIAGNOSIS — I214 Non-ST elevation (NSTEMI) myocardial infarction: Secondary | ICD-10-CM | POA: Diagnosis not present

## 2018-02-02 DIAGNOSIS — F419 Anxiety disorder, unspecified: Secondary | ICD-10-CM | POA: Diagnosis not present

## 2018-02-03 DIAGNOSIS — J9601 Acute respiratory failure with hypoxia: Secondary | ICD-10-CM | POA: Diagnosis not present

## 2018-02-03 DIAGNOSIS — Z515 Encounter for palliative care: Secondary | ICD-10-CM | POA: Diagnosis not present

## 2018-02-04 ENCOUNTER — Encounter: Payer: Self-pay | Admitting: *Deleted

## 2018-02-04 NOTE — Patient Outreach (Signed)
[facility]) Care Management  02/04/2018  Colleen Nguyen [birth year] [id number]  Patient presented today for difficult case discussion with [facility] multidisciplinary team.  [place], [place] Care Management [phone]

## 2018-02-05 DIAGNOSIS — F418 Other specified anxiety disorders: Secondary | ICD-10-CM | POA: Diagnosis not present

## 2018-02-05 DIAGNOSIS — J189 Pneumonia, unspecified organism: Secondary | ICD-10-CM | POA: Diagnosis not present

## 2018-02-05 DIAGNOSIS — I10 Essential (primary) hypertension: Secondary | ICD-10-CM | POA: Diagnosis not present

## 2018-02-05 DIAGNOSIS — M25512 Pain in left shoulder: Secondary | ICD-10-CM | POA: Diagnosis not present

## 2018-02-05 DIAGNOSIS — D649 Anemia, unspecified: Secondary | ICD-10-CM | POA: Diagnosis not present

## 2018-02-05 DIAGNOSIS — I4891 Unspecified atrial fibrillation: Secondary | ICD-10-CM | POA: Diagnosis not present

## 2018-02-08 DIAGNOSIS — Z515 Encounter for palliative care: Secondary | ICD-10-CM | POA: Diagnosis not present

## 2018-02-08 DIAGNOSIS — J9601 Acute respiratory failure with hypoxia: Secondary | ICD-10-CM | POA: Diagnosis not present

## 2018-02-15 ENCOUNTER — Other Ambulatory Visit: Payer: Self-pay | Admitting: *Deleted

## 2018-02-15 NOTE — Patient Outreach (Signed)
[facility]) Care Management  02/15/2018  Desiree Daise 1922/09/22 [id number]   Post Acute Care Coordination  Phone call to patient's daughter to assess for in home care needs post discharge from the SNF.  Per patient's daughter and Hospice nurse that was present in her home during the call, patient is now open to [employer] with [facility]. Patient discharged home from [facility] on 02/13/18.  Plan: This social worker will close patient to [facility] care management at this time. RNCM Reginia Naas to be notified.  Sheralyn Boatman [facility] Care Management [phone]

## 2018-05-01 DEATH — deceased
